# Patient Record
Sex: Male | Born: 1950 | Race: White | Hispanic: No | Marital: Married | Smoking: Former smoker
Health system: Southern US, Community
[De-identification: ages and names within clinical notes are randomized; demographics above are authoritative.]

## PROBLEM LIST (undated history)

## (undated) DIAGNOSIS — E291 Testicular hypofunction: Secondary | ICD-10-CM

## (undated) DIAGNOSIS — K219 Gastro-esophageal reflux disease without esophagitis: Secondary | ICD-10-CM

## (undated) DIAGNOSIS — R7303 Prediabetes: Secondary | ICD-10-CM

## (undated) DIAGNOSIS — E559 Vitamin D deficiency, unspecified: Secondary | ICD-10-CM

## (undated) DIAGNOSIS — N400 Enlarged prostate without lower urinary tract symptoms: Secondary | ICD-10-CM

## (undated) DIAGNOSIS — I10 Essential (primary) hypertension: Secondary | ICD-10-CM

## (undated) DIAGNOSIS — J309 Allergic rhinitis, unspecified: Secondary | ICD-10-CM

## (undated) DIAGNOSIS — N200 Calculus of kidney: Secondary | ICD-10-CM

## (undated) DIAGNOSIS — E785 Hyperlipidemia, unspecified: Secondary | ICD-10-CM

## (undated) HISTORY — DX: Hyperlipidemia, unspecified: E78.5

## (undated) HISTORY — DX: Benign prostatic hyperplasia without lower urinary tract symptoms: N40.0

## (undated) HISTORY — DX: Morbid (severe) obesity due to excess calories: E66.01

## (undated) HISTORY — DX: Prediabetes: R73.03

## (undated) HISTORY — DX: Gastro-esophageal reflux disease without esophagitis: K21.9

## (undated) HISTORY — DX: Calculus of kidney: N20.0

## (undated) HISTORY — DX: Vitamin D deficiency, unspecified: E55.9

## (undated) HISTORY — DX: Testicular hypofunction: E29.1

## (undated) HISTORY — DX: Allergic rhinitis, unspecified: J30.9

## (undated) HISTORY — DX: Essential (primary) hypertension: I10

---
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
## 1975-09-28 HISTORY — PX: KNEE SURGERY: SHX244

## 1990-09-27 HISTORY — PX: FLEXIBLE SIGMOIDOSCOPY: SHX1649

## 2005-12-04 ENCOUNTER — Emergency Department (HOSPITAL_COMMUNITY): Admission: EM | Admit: 2005-12-04 | Discharge: 2005-12-04 | Payer: Self-pay | Admitting: Emergency Medicine

## 2008-02-05 ENCOUNTER — Ambulatory Visit: Payer: Self-pay | Admitting: Internal Medicine

## 2008-02-16 ENCOUNTER — Telehealth: Payer: Self-pay | Admitting: Internal Medicine

## 2008-02-16 ENCOUNTER — Encounter: Payer: Self-pay | Admitting: Internal Medicine

## 2008-02-16 ENCOUNTER — Ambulatory Visit: Payer: Self-pay | Admitting: Internal Medicine

## 2008-02-20 ENCOUNTER — Encounter: Payer: Self-pay | Admitting: Internal Medicine

## 2008-12-12 ENCOUNTER — Ambulatory Visit (HOSPITAL_COMMUNITY): Admission: RE | Admit: 2008-12-12 | Discharge: 2008-12-12 | Payer: Self-pay | Admitting: Internal Medicine

## 2009-05-22 IMAGING — CR DG LUMBAR SPINE COMPLETE 4+V
5 series · 5 of 5 positions shown · non-contrast
Comparison: None

CLINICAL DATA: Lower back pain worse on the left side with
radiation into the left leg.

LUMBAR SPINE - COMPLETE 4+ VIEW

[view not recorded (1 of 5)]
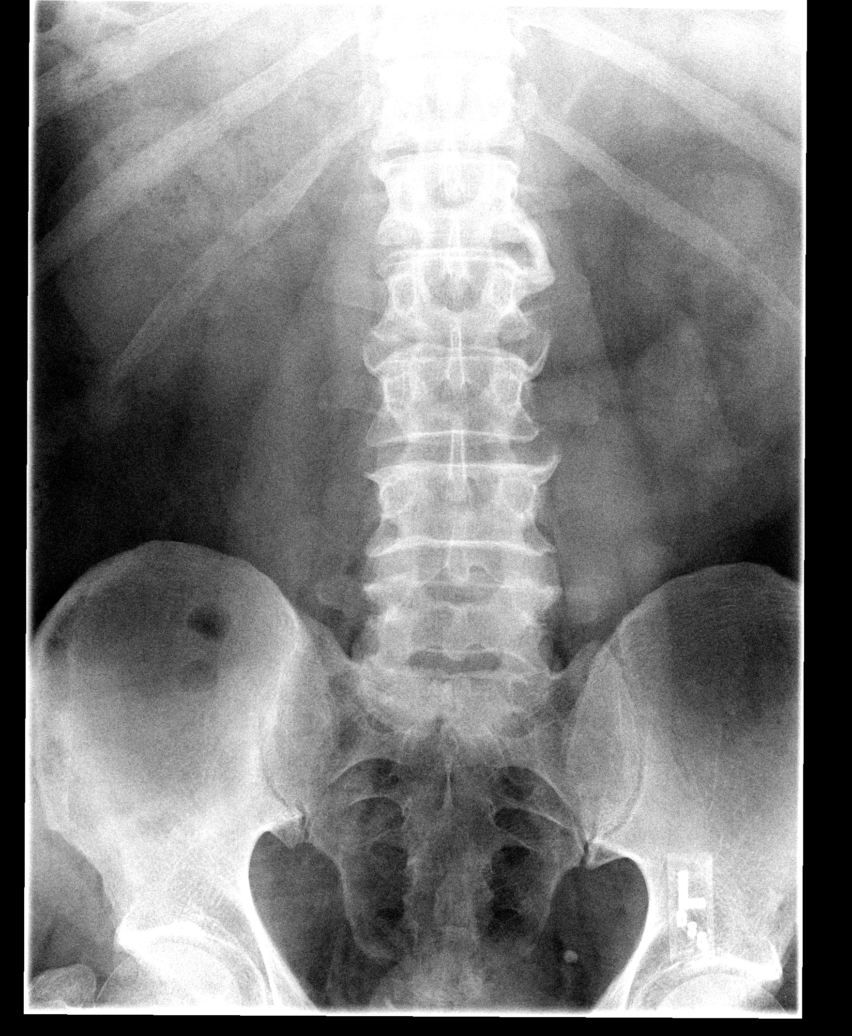

[view not recorded (2 of 5)]
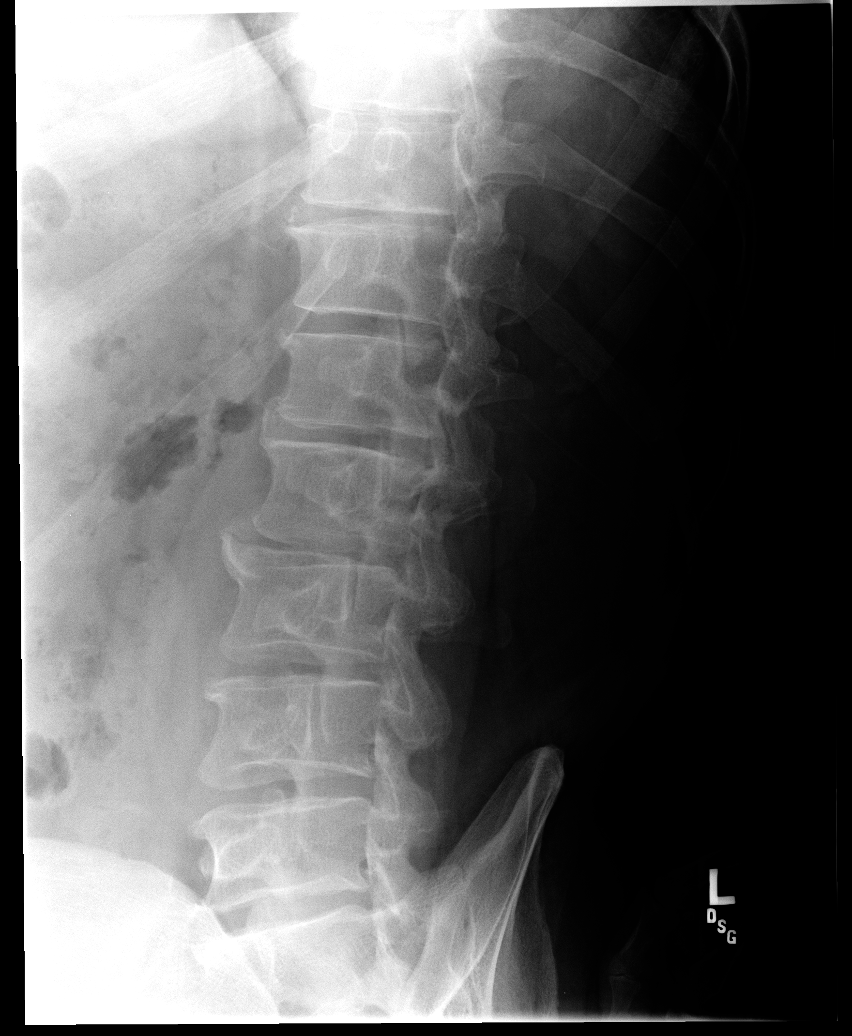

[view not recorded (3 of 5)]
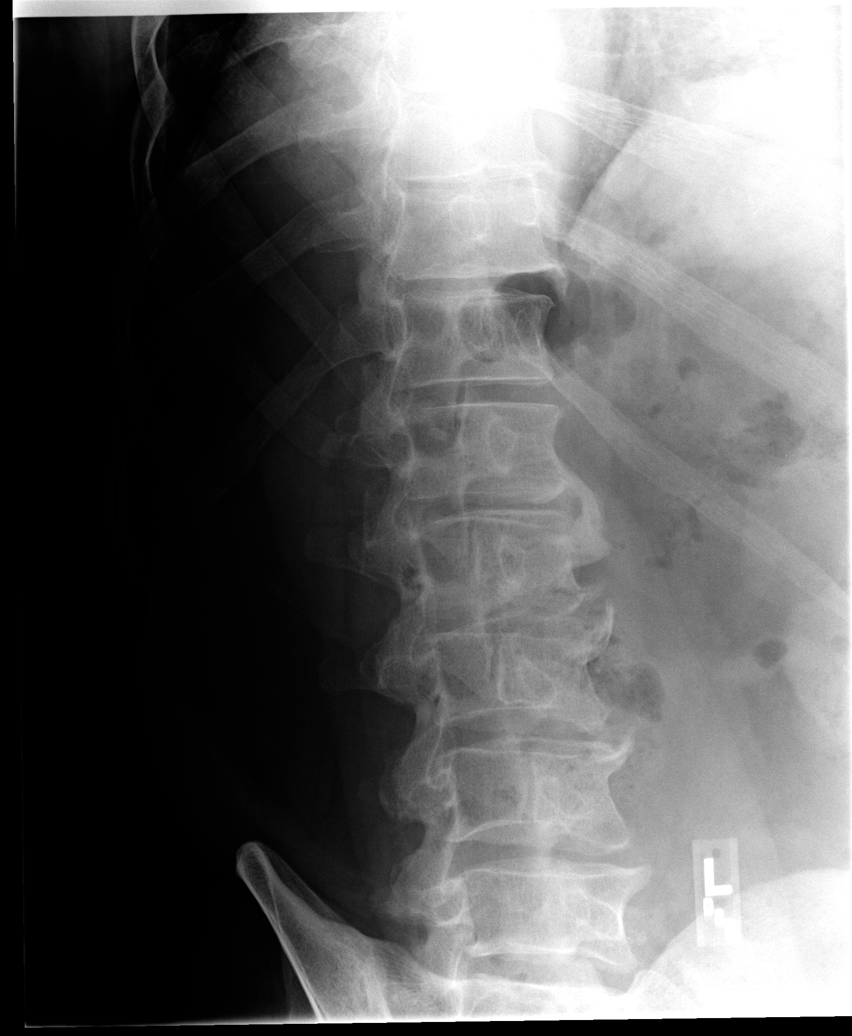

[view not recorded (4 of 5)]
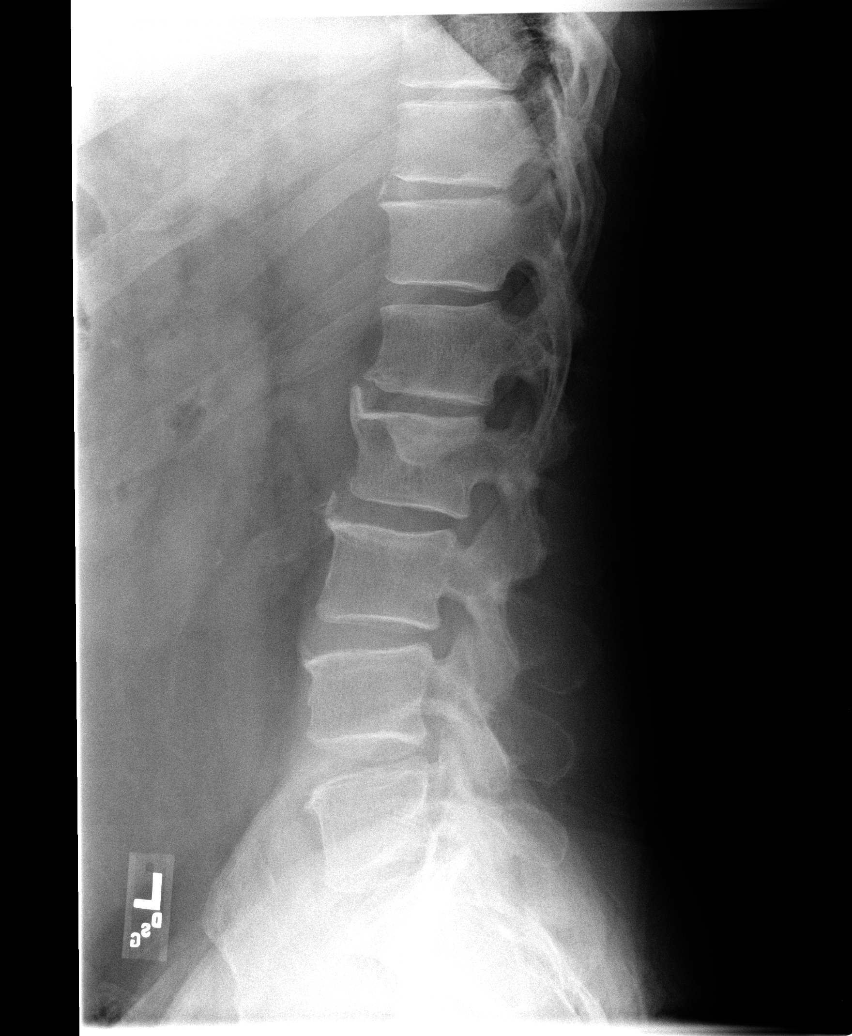

[view not recorded (5 of 5)]
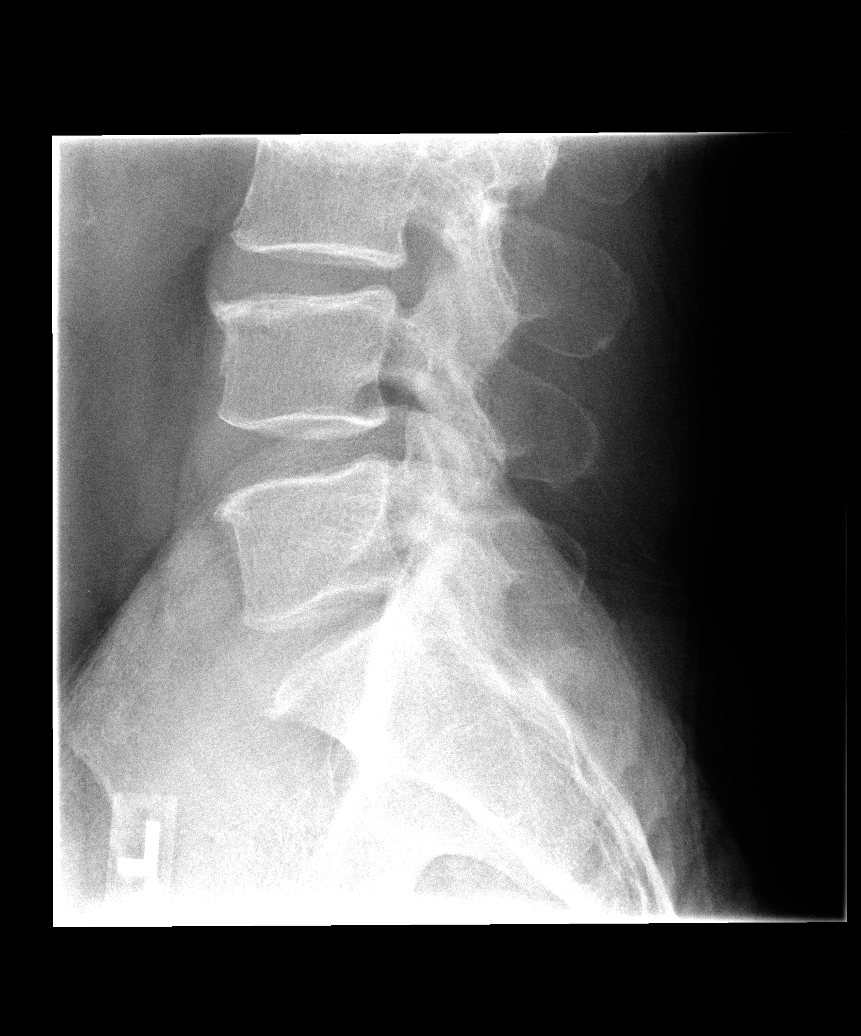

[5 of 5 positions shown; findings below may reference images not displayed]

FINDINGS: Bone density is within normal limits for age.  There are
five lumbar type vertebral bodies.  Mild anterior vertebral body
height loss is identified at the T11, L1 and L4 levels.  Associated
lateral and anterior osteophytosis is identified at the T11-12, L1-
2, L2-3 and L3-4  levels.  Anterior lipping of the L5 vertebral
body is also noted.

Lateral views demonstrate  mild retrolisthesis of L2 on L3
measuring 3 mm.  Oblique views demonstrate no evidence for
spondylosis or spondylolisthesis.  The sacral white lines appear
maintained.
IMPRESSION: Degenerative bony changes as described above.

## 2013-04-11 ENCOUNTER — Encounter: Payer: Self-pay | Admitting: Internal Medicine

## 2013-08-08 ENCOUNTER — Other Ambulatory Visit: Payer: Self-pay | Admitting: Emergency Medicine

## 2013-09-25 ENCOUNTER — Encounter: Payer: Self-pay | Admitting: Internal Medicine

## 2013-09-28 ENCOUNTER — Ambulatory Visit: Payer: Self-pay | Admitting: Internal Medicine

## 2013-11-06 ENCOUNTER — Other Ambulatory Visit: Payer: Self-pay | Admitting: Emergency Medicine

## 2014-01-21 ENCOUNTER — Other Ambulatory Visit: Payer: Self-pay | Admitting: Internal Medicine

## 2014-03-26 ENCOUNTER — Other Ambulatory Visit: Payer: Self-pay | Admitting: Emergency Medicine

## 2014-04-01 ENCOUNTER — Encounter: Payer: Self-pay | Admitting: Internal Medicine

## 2014-04-17 ENCOUNTER — Encounter: Payer: Self-pay | Admitting: Internal Medicine

## 2014-05-20 ENCOUNTER — Other Ambulatory Visit: Payer: Self-pay | Admitting: Emergency Medicine

## 2014-05-20 MED ORDER — OMEPRAZOLE 40 MG PO CPDR: 40.0000 mg | DELAYED_RELEASE_CAPSULE | Freq: Every day | ORAL | Status: DC

## 2014-05-20 NOTE — Telephone Encounter (Signed)
Spoke with Alex Lewis and she will let patient to know to call and schedule follow up appt, will send 30 days supply to [facility] and [employer]

## 2014-05-29 ENCOUNTER — Ambulatory Visit (INDEPENDENT_AMBULATORY_CARE_PROVIDER_SITE_OTHER): Payer: BC Managed Care – PPO | Admitting: Internal Medicine

## 2014-05-29 ENCOUNTER — Encounter: Payer: Self-pay | Admitting: Internal Medicine

## 2014-05-29 ENCOUNTER — Encounter (INDEPENDENT_AMBULATORY_CARE_PROVIDER_SITE_OTHER): Payer: Self-pay

## 2014-05-29 VITALS — BP 118/86 | HR 76 | Temp 97.5°F | Resp 16 | Ht 70.0 in | Wt 246.4 lb

## 2014-05-29 DIAGNOSIS — I10 Essential (primary) hypertension: Secondary | ICD-10-CM

## 2014-05-29 DIAGNOSIS — E782 Mixed hyperlipidemia: Secondary | ICD-10-CM | POA: Insufficient documentation

## 2014-05-29 DIAGNOSIS — R7303 Prediabetes: Secondary | ICD-10-CM

## 2014-05-29 DIAGNOSIS — Z79899 Other long term (current) drug therapy: Secondary | ICD-10-CM | POA: Insufficient documentation

## 2014-05-29 DIAGNOSIS — R7309 Other abnormal glucose: Secondary | ICD-10-CM | POA: Insufficient documentation

## 2014-05-29 DIAGNOSIS — Z111 Encounter for screening for respiratory tuberculosis: Secondary | ICD-10-CM

## 2014-05-29 DIAGNOSIS — R740 Nonspecific elevation of levels of transaminase and lactic acid dehydrogenase [LDH]: Secondary | ICD-10-CM

## 2014-05-29 DIAGNOSIS — Z113 Encounter for screening for infections with a predominantly sexual mode of transmission: Secondary | ICD-10-CM

## 2014-05-29 DIAGNOSIS — Z125 Encounter for screening for malignant neoplasm of prostate: Secondary | ICD-10-CM

## 2014-05-29 DIAGNOSIS — R7402 Elevation of levels of lactic acid dehydrogenase (LDH): Secondary | ICD-10-CM

## 2014-05-29 DIAGNOSIS — E559 Vitamin D deficiency, unspecified: Secondary | ICD-10-CM

## 2014-05-29 DIAGNOSIS — R7401 Elevation of levels of liver transaminase levels: Secondary | ICD-10-CM

## 2014-05-29 DIAGNOSIS — Z1212 Encounter for screening for malignant neoplasm of rectum: Secondary | ICD-10-CM

## 2014-05-29 DIAGNOSIS — Z0000 Encounter for general adult medical examination without abnormal findings: Secondary | ICD-10-CM

## 2014-05-29 LAB — CBC WITH DIFFERENTIAL/PLATELET
BASOS PCT: 0 % (ref 0–1)
Basophils Absolute: 0 10*3/uL (ref 0.0–0.1)
Eosinophils Absolute: 0.1 10*3/uL (ref 0.0–0.7)
Eosinophils Relative: 2 % (ref 0–5)
HCT: 46.7 % (ref 39.0–52.0)
Hemoglobin: 16.4 g/dL (ref 13.0–17.0)
LYMPHS ABS: 1.5 10*3/uL (ref 0.7–4.0)
LYMPHS PCT: 28 % (ref 12–46)
MCH: 31.2 pg (ref 26.0–34.0)
MCHC: 35.1 g/dL (ref 30.0–36.0)
MCV: 89 fL (ref 78.0–100.0)
MONO ABS: 0.5 10*3/uL (ref 0.1–1.0)
Monocytes Relative: 9 % (ref 3–12)
Neutro Abs: 3.4 10*3/uL (ref 1.7–7.7)
Neutrophils Relative %: 61 % (ref 43–77)
PLATELETS: 227 10*3/uL (ref 150–400)
RBC: 5.25 MIL/uL (ref 4.22–5.81)
RDW: 13.2 % (ref 11.5–15.5)
WBC: 5.5 10*3/uL (ref 4.0–10.5)

## 2014-05-29 LAB — HEMOGLOBIN A1C
Hgb A1c MFr Bld: 6 % — ABNORMAL HIGH (ref <5.7)
MEAN PLASMA GLUCOSE: 126 mg/dL — AB (ref <117)

## 2014-05-29 NOTE — Patient Instructions (Signed)
Recommend the book "The END of DIETING" by Dr Baker Janus   and the book "The END of DIABETES " by Dr Excell Seltzer  At [email or web address] - get book & Audio CD's      Being diabetic has a  300% increased risk for heart attack, stroke, cancer, and alzheimer- type vascular dementia. It is very important that you work harder with diet by avoiding all foods that are white except chicken & fish. Avoid white rice (brown & wild rice is OK), white potatoes (sweetpotatoes in moderation is OK), White bread or wheat bread or anything made out of white flour like bagels, donuts, rolls, buns, biscuits, cakes, pastries, cookies, pizza crust, and pasta (made from white flour & egg whites) - vegetarian pasta or spinach or wheat pasta is OK. Multigrain breads like Arnold's or [employer], or multigrain sandwich thins or flatbreads.  Diet, exercise and weight loss can reverse and cure diabetes in the early stages.  Diet, exercise and weight loss is very important in the control and prevention of complications of diabetes which affects every system in your body, ie. Brain - dementia/stroke, eyes - glaucoma/blindness, heart - heart attack/heart failure, kidneys - dialysis, stomach - gastric paralysis, intestines - malabsorption, nerves - severe painful neuritis, circulation - gangrene & loss of a leg(s), and finally cancer and Alzheimers.    I recommend avoid fried & greasy foods,  sweets/candy, white rice (brown or wild rice or Quinoa is OK), white potatoes (sweet potatoes are OK) - anything made from white flour - bagels, doughnuts, rolls, buns, biscuits,white and wheat breads, pizza crust and traditional pasta made of white flour & egg white(vegetarian pasta or spinach or wheat pasta is OK).  Multi-grain bread is OK - like multi-grain flat bread or sandwich thins. Avoid alcohol in excess. Exercise is also important.    Eat all the vegetables you want - avoid meat, especially red meat and dairy - especially cheese.  Cheese  is the most concentrated form of trans-fats which is the worst thing to clog up our arteries. Veggie cheese is OK which can be found in the fresh produce section at [employer] or [employer] or [employer]  Preventive Care for Adults A healthy lifestyle and preventive care can promote health and wellness. Preventive health guidelines for men include the following key practices:  A routine yearly physical is a good way to check with your health care Emile Ringgenberg about your health and preventative screening. It is a chance to share any concerns and updates on your health and to receive a thorough exam.  Visit your dentist for a routine exam and preventative care every 6 months. Brush your teeth twice a day and floss once a day. Good oral hygiene prevents tooth decay and gum disease.  The frequency of eye exams is based on your age, health, family medical history, use of contact lenses, and other factors. Follow your health care Pelagia Iacobucci's recommendations for frequency of eye exams.  Eat a healthy diet. Foods such as vegetables, fruits, whole grains, low-fat dairy products, and lean protein foods contain the nutrients you need without too many calories. Decrease your intake of foods high in solid fats, added sugars, and salt. Eat the right amount of calories for you.Get information about a proper diet from your health care Starsha Morning, if necessary.  Regular physical exercise is one of the most important things you can do for your health. Most adults should get at least 150 minutes of moderate-intensity exercise (any activity that  increases your heart rate and causes you to sweat) each week. In addition, most adults need muscle-strengthening exercises on 2 or more days a week.  Maintain a healthy weight. The body mass index (BMI) is a screening tool to identify possible weight problems. It provides an estimate of body fat based on height and weight. Your health care Evgenia Merriman can find your BMI and can help you  achieve or maintain a healthy weight.For adults 20 years and older:  A BMI below 18.5 is considered underweight.  A BMI of 18.5 to 24.9 is normal.  A BMI of 25 to 29.9 is considered overweight.  A BMI of 30 and above is considered obese.  Maintain normal blood lipids and cholesterol levels by exercising and minimizing your intake of saturated fat. Eat a balanced diet with plenty of fruit and vegetables. Blood tests for lipids and cholesterol should begin at age 20 and be repeated every 5 years. If your lipid or cholesterol levels are high, you are over 50, or you are at high risk for heart disease, you may need your cholesterol levels checked more frequently.Ongoing high lipid and cholesterol levels should be treated with medicines if diet and exercise are not working.  If you smoke, find out from your health care Nosson Wender how to quit. If you do not use tobacco, do not start.  Lung cancer screening is recommended for adults aged 72-80 years who are at high risk for developing lung cancer because of a history of smoking. A yearly low-dose CT scan of the lungs is recommended for people who have at least a 30-pack-year history of smoking and are a current smoker or have quit within the past 15 years. A pack year of smoking is smoking an average of 1 pack of cigarettes a day for 1 year (for example: 1 pack a day for 30 years or 2 packs a day for 15 years). Yearly screening should continue until the smoker has stopped smoking for at least 15 years. Yearly screening should be stopped for people who develop a health problem that would prevent them from having lung cancer treatment.  If you choose to drink alcohol, do not have more than 2 drinks per day. One drink is considered to be 12 ounces (355 mL) of beer, 5 ounces (148 mL) of wine, or 1.5 ounces (44 mL) of liquor.  Avoid use of street drugs. Do not share needles with anyone. Ask for help if you need support or instructions about stopping the use of  drugs.  High blood pressure causes heart disease and increases the risk of stroke. Your blood pressure should be checked at least every 1-2 years. Ongoing high blood pressure should be treated with medicines, if weight loss and exercise are not effective.  If you are 28-64 years old, ask your health care Candida Vetter if you should take aspirin to prevent heart disease.  Diabetes screening involves taking a blood sample to check your fasting blood sugar level. This should be done once every 3 years, after age 13, if you are within normal weight and without risk factors for diabetes. Testing should be considered at a younger age or be carried out more frequently if you are overweight and have at least 1 risk factor for diabetes.  Colorectal cancer can be detected and often prevented. Most routine colorectal cancer screening begins at the age of 78 and continues through age 56. However, your health care Shaterra Sanzone may recommend screening at an earlier age if you have risk  factors for colon cancer. On a yearly basis, your health care Jai Bear may provide home test kits to check for hidden blood in the stool. Use of a small camera at the end of a tube to directly examine the colon (sigmoidoscopy or colonoscopy) can detect the earliest forms of colorectal cancer. Talk to your health care Ariann Khaimov about this at age 48, when routine screening begins. Direct exam of the colon should be repeated every 5-10 years through age 60, unless early forms of precancerous polyps or small growths are found.  People who are at an increased risk for hepatitis B should be screened for this virus. You are considered at high risk for hepatitis B if:  You were born in a country where hepatitis B occurs often. Talk with your health care Colen Eltzroth about which countries are considered high risk.  Your parents were born in a high-risk country and you have not received a shot to protect against hepatitis B (hepatitis B vaccine).  You have  HIV or AIDS.  You use needles to inject street drugs.  You live with, or have sex with, someone who has hepatitis B.  You are a man who has sex with other men (MSM).  You get hemodialysis treatment.  You take certain medicines for conditions such as cancer, organ transplantation, and autoimmune conditions.  Hepatitis C blood testing is recommended for all people born from [birth year] through 1965 and any individual with known risks for hepatitis C.  Practice safe sex. Use condoms and avoid high-risk sexual practices to reduce the spread of sexually transmitted infections (STIs). STIs include gonorrhea, chlamydia, syphilis, trichomonas, herpes, HPV, and human immunodeficiency virus (HIV). Herpes, HIV, and HPV are viral illnesses that have no cure. They can result in disability, cancer, and death.  If you are at risk of being infected with HIV, it is recommended that you take a prescription medicine daily to prevent HIV infection. This is called preexposure prophylaxis (PrEP). You are considered at risk if:  You are a man who has sex with other men (MSM) and have other risk factors.  You are a heterosexual man, are sexually active, and are at increased risk for HIV infection.  You take drugs by injection.  You are sexually active with a partner who has HIV.  Talk with your health care Dalaney Needle about whether you are at high risk of being infected with HIV. If you choose to begin PrEP, you should first be tested for HIV. You should then be tested every 3 months for as long as you are taking PrEP.  A one-time screening for abdominal aortic aneurysm (AAA) and surgical repair of large AAAs by ultrasound are recommended for men ages 51 to 11 years who are current or former smokers.  Healthy men should no longer receive prostate-specific antigen (PSA) blood tests as part of routine cancer screening. Talk with your health care Gladyce Mcray about prostate cancer screening.  Testicular cancer screening is  not recommended for adult males who have no symptoms. Screening includes self-exam, a health care Keyontae Huckeby exam, and other screening tests. Consult with your health care Almarie Kurdziel about any symptoms you have or any concerns you have about testicular cancer.  Use sunscreen. Apply sunscreen liberally and repeatedly throughout the day. You should seek shade when your shadow is shorter than you. Protect yourself by wearing long sleeves, pants, a wide-brimmed hat, and sunglasses year round, whenever you are outdoors.  Once a month, do a whole-body skin exam, using a mirror to look  at the skin on your back. Tell your health care Brantlee Penn about new moles, moles that have irregular borders, moles that are larger than a pencil eraser, or moles that have changed in shape or color.  Stay current with required vaccines (immunizations).  Influenza vaccine. All adults should be immunized every year.  Tetanus, diphtheria, and acellular pertussis (Td, Tdap) vaccine. An adult who has not previously received Tdap or who does not know his vaccine status should receive 1 dose of Tdap. This initial dose should be followed by tetanus and diphtheria toxoids (Td) booster doses every 10 years. Adults with an unknown or incomplete history of completing a 3-dose immunization series with Td-containing vaccines should begin or complete a primary immunization series including a Tdap dose. Adults should receive a Td booster every 10 years.  Varicella vaccine. An adult without evidence of immunity to varicella should receive 2 doses or a second dose if he has previously received 1 dose.  Human papillomavirus (HPV) vaccine. Males aged 29-21 years who have not received the vaccine previously should receive the 3-dose series. Males aged 22-26 years may be immunized. Immunization is recommended through the age of 26 years for any male who has sex with males and did not get any or all doses earlier. Immunization is recommended for any  person with an immunocompromised condition through the age of 29 years if he did not get any or all doses earlier. During the 3-dose series, the second dose should be obtained 4-8 weeks after the first dose. The third dose should be obtained 24 weeks after the first dose and 16 weeks after the second dose.  Zoster vaccine. One dose is recommended for adults aged 38 years or older unless certain conditions are present.  Measles, mumps, and rubella (MMR) vaccine. Adults born before [birth year] generally are considered immune to measles and mumps. Adults born in [birth year] or later should have 1 or more doses of MMR vaccine unless there is a contraindication to the vaccine or there is laboratory evidence of immunity to each of the three diseases. A routine second dose of MMR vaccine should be obtained at least 28 days after the first dose for students attending postsecondary schools, health care workers, or international travelers. People who received inactivated measles vaccine or an unknown type of measles vaccine during 1963-1967 should receive 2 doses of MMR vaccine. People who received inactivated mumps vaccine or an unknown type of mumps vaccine before 1979 and are at high risk for mumps infection should consider immunization with 2 doses of MMR vaccine. Unvaccinated health care workers born before [birth year] who lack laboratory evidence of measles, mumps, or rubella immunity or laboratory confirmation of disease should consider measles and mumps immunization with 2 doses of MMR vaccine or rubella immunization with 1 dose of MMR vaccine.  Pneumococcal 13-valent conjugate (PCV13) vaccine. When indicated, a person who is uncertain of his immunization history and has no record of immunization should receive the PCV13 vaccine. An adult aged 68 years or older who has certain medical conditions and has not been previously immunized should receive 1 dose of PCV13 vaccine. This PCV13 should be followed with a dose of pneumococcal  polysaccharide (PPSV23) vaccine. The PPSV23 vaccine dose should be obtained at least 8 weeks after the dose of PCV13 vaccine. An adult aged 95 years or older who has certain medical conditions and previously received 1 or more doses of PPSV23 vaccine should receive 1 dose of PCV13. The PCV13 vaccine dose should be obtained 1  or more years after the last PPSV23 vaccine dose.  Pneumococcal polysaccharide (PPSV23) vaccine. When PCV13 is also indicated, PCV13 should be obtained first. All adults aged 65 years and older should be immunized. An adult younger than age 65 years who has certain medical conditions should be immunized. Any person who resides in a nursing home or long-term care facility should be immunized. An adult smoker should be immunized. People with an immunocompromised condition and certain other conditions should receive both PCV13 and PPSV23 vaccines. People with human immunodeficiency virus (HIV) infection should be immunized as soon as possible after diagnosis. Immunization during chemotherapy or radiation therapy should be avoided. Routine use of PPSV23 vaccine is not recommended for American Indians, [place] Natives, or people younger than 65 years unless there are medical conditions that require PPSV23 vaccine. When indicated, people who have unknown immunization and have no record of immunization should receive PPSV23 vaccine. One-time revaccination 5 years after the first dose of PPSV23 is recommended for people aged 19-64 years who have chronic kidney failure, nephrotic syndrome, asplenia, or immunocompromised conditions. People who received 1-2 doses of PPSV23 before age 65 years should receive another dose of PPSV23 vaccine at age 65 years or later if at least 5 years have passed since the previous dose. Doses of PPSV23 are not needed for people immunized with PPSV23 at or after age 65 years.  Meningococcal vaccine. Adults with asplenia or persistent complement component deficiencies  should receive 2 doses of quadrivalent meningococcal conjugate (MenACWY-D) vaccine. The doses should be obtained at least 2 months apart. Microbiologists working with certain meningococcal bacteria, military recruits, people at risk during an outbreak, and people who travel to or live in countries with a high rate of meningitis should be immunized. A first-year college student up through age 21 years who is living in a residence hall should receive a dose if he did not receive a dose on or after his 16th birthday. Adults who have certain high-risk conditions should receive one or more doses of vaccine.  Hepatitis A vaccine. Adults who wish to be protected from this disease, have certain high-risk conditions, work with hepatitis A-infected animals, work in hepatitis A research labs, or travel to or work in countries with a high rate of hepatitis A should be immunized. Adults who were previously unvaccinated and who anticipate close contact with an international adoptee during the first 60 days after arrival in the [place] from a country with a high rate of hepatitis A should be immunized.  Hepatitis B vaccine. Adults should be immunized if they wish to be protected from this disease, have certain high-risk conditions, may be exposed to blood or other infectious body fluids, are household contacts or sex partners of hepatitis B positive people, are clients or workers in certain care facilities, or travel to or work in countries with a high rate of hepatitis B.  Haemophilus influenzae type b (Hib) vaccine. A previously unvaccinated person with asplenia or sickle cell disease or having a scheduled splenectomy should receive 1 dose of Hib vaccine. Regardless of previous immunization, a recipient of a hematopoietic stem cell transplant should receive a 3-dose series 6-12 months after his successful transplant. Hib vaccine is not recommended for adults with HIV infection. Preventive Service / Frequency Ages  40 to 64  Blood pressure check.** / Every 1 to 2 years.  Lipid and cholesterol check.** / Every 5 years beginning at age 20.  Lung cancer screening. / Every year if you are aged 55-80   years and have a 30-pack-year history of smoking and currently smoke or have quit within the past 15 years. Yearly screening is stopped once you have quit smoking for at least 15 years or develop a health problem that would prevent you from having lung cancer treatment.  Fecal occult blood test (FOBT) of stool. / Every year beginning at age 50 and continuing until age 75. You may not have to do this test if you get a colonoscopy every 10 years.  Flexible sigmoidoscopy** or colonoscopy.** / Every 5 years for a flexible sigmoidoscopy or every 10 years for a colonoscopy beginning at age 50 and continuing until age 75.  Hepatitis C blood test.** / For all people born from 1945 through 1965 and any individual with known risks for hepatitis C.  Skin self-exam. / Monthly.  Influenza vaccine. / Every year.  Tetanus, diphtheria, and acellular pertussis (Tdap/Td) vaccine.** / Consult your health care Corben Auzenne. 1 dose of Td every 10 years.  Varicella vaccine.** / Consult your health care Zaida Reiland.  Zoster vaccine.** / 1 dose for adults aged 60 years or older.  Measles, mumps, rubella (MMR) vaccine.** / You need at least 1 dose of MMR if you were born in 1957 or later. You may also need a second dose.  Pneumococcal 13-valent conjugate (PCV13) vaccine.** / Consult your health care Edyn Popoca.  Pneumococcal polysaccharide (PPSV23) vaccine.** / 1 to 2 doses if you smoke cigarettes or if you have certain conditions.  Meningococcal vaccine.** / Consult your health care Aniyiah Zell.  Hepatitis A vaccine.** / Consult your health care Kennen Stammer.  Hepatitis B vaccine.** / Consult your health care Gorje Iyer.  Haemophilus influenzae type b (Hib) vaccine.** / Consult your health care Rosella Crandell.  

## 2014-05-29 NOTE — Progress Notes (Signed)
Patient ID: Alex Lewis, male   DOB: Apr 10, 1951, 63 y.o.   MRN: [id number]   Annual Screening Comprehensive Examination  This very nice 63 y.o.MWM presents for complete physical.  Patient has been followed for HTN, Prediabetes, Hyperlipidemia, and Vitamin D Deficiency. Patient has been lost to f/u since last Sept 2014    HTN predates since 2001. Patient's BP has been controlled at home.Today's BP: 118/86 mmHg. Patient denies any cardiac symptoms as chest pain, palpitations, shortness of breath, dizziness or ankle swelling.   Patient's hyperlipidemia is controlled with diet and medications. Patient denies myalgias or other medication SE's. Last lipids were Chol 137, TG 130, HDL 36 and LDL 75 in Sept 4098.     Patient has Morbid Obesity (BMI 36) and consequent PreDiabetes. Patient denies reactive hypoglycemic symptoms, visual blurring, diabetic polys, or paresthesias. Last A1c was 6.0% in Sept 2014.    Finally, patient has history of Vitamin D Deficiency of 16 in 2008 and last vitamin D was No results found for requested labs within last 365 days..   Medication Sig  . aspirin 81 MG tablet Take 81 mg by mouth daily.  Marland Kitchen atenolol (TENORMIN) 50 MG tablet TAKE 1 TABLET DAILY FOR    BLOOD PRESSURE  . Cholecalciferol (VITAMIN D-3) 5000 UNITS TABS Take 10,000 Units by mouth daily.  Marland Kitchen loratadine (CLARITIN) 10 MG tablet Take 10 mg by mouth daily.  Marland Kitchen omeprazole (PRILOSEC) 40 MG capsule Take 1 capsule (40 mg total) by mouth daily.  . pravastatin (PRAVACHOL) 40 MG tablet TAKE 1 TABLET DAILY FOR    CHOLESTEROL   Allergies  Allergen Reactions  . Penicillins     REACTION: unknown   Past Medical History  Diagnosis Date  . Hypertension   . Hyperlipidemia   . Pre-diabetes   . Morbid obesity   . Hypogonadism, male   . Vitamin D deficiency   . GERD (gastroesophageal reflux disease)   . Allergic rhinitis   . BPH (benign prostatic hyperplasia)   . Kidney stones    Past Surgical History  Procedure  Laterality Date  . Knee surgery Right 1977  . Flexible sigmoidoscopy  1992  . Tonsillectomy and adenoidectomy  1960   Family History  Problem Relation Age of Onset  . Cancer Father     lung  . Diabetes Father   . Diabetes Brother    History   Social History  . Marital Status: Married    Spouse Name: N/A    Number of Children: N/A  . Years of Education: N/A   Occupational History  . 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 Smoking status: Former Smoker    Quit date: 09/28/1975  . Smokeless tobacco: Not on file  . Alcohol Use: Yes     Comment: occasional  . Drug Use: No  . Sexual Activity: Not on file      ROS Constitutional: Denies fever, chills, weight loss/gain, headaches, insomnia, fatigue, night sweats or change in appetite. Eyes: Denies redness, blurred vision, diplopia, discharge, itchy or watery eyes.  ENT: Denies discharge, congestion, post nasal drip, epistaxis, sore throat, earache, hearing loss, dental pain, Tinnitus, Vertigo, Sinus pain or snoring.  Cardio: Denies chest pain, palpitations, irregular heartbeat, syncope, dyspnea, diaphoresis, orthopnea, PND, claudication or edema Respiratory: denies cough, dyspnea, DOE, pleurisy, hoarseness, laryngitis or wheezing.  Gastrointestinal: Denies dysphagia, heartburn, reflux, water brash, pain, cramps, nausea, vomiting, bloating, diarrhea, constipation, hematemesis, melena, hematochezia, jaundice or hemorrhoids Genitourinary: Denies  dysuria, frequency, urgency, nocturia, hesitancy, discharge, hematuria or flank pain Musculoskeletal: Denies arthralgia, myalgia, stiffness, Jt. Swelling, pain, limp or strain/sprain. Denies Falls. Skin: Denies puritis, rash, hives, warts, acne, eczema or change in skin lesion Neuro: No weakness, tremor, incoordination, spasms, paresthesia or pain Psychiatric: Denies confusion, memory loss or sensory loss. Denies Depression. Endocrine: Denies change in  weight, skin, hair change, nocturia, and paresthesia, diabetic polys, visual blurring or hyper / hypo glycemic episodes.  Heme/Lymph: No excessive bleeding, bruising or enlarged lymph nodes.  Physical Exam  BP 118/86  Pulse 76  Temp(Src) 97.5 F (36.4 C) (Temporal)  Resp 16  Ht  (1.778 m)  Wt 246 lb 6.4 oz (111.766 kg)  BMI 35.35 kg/m2  General Appearance: Well nourished, in no apparent distress. Eyes: PERRLA, EOMs, conjunctiva no swelling or erythema, normal fundi and vessels. Sinuses: No frontal/maxillary tenderness ENT/Mouth: EACs patent / TMs  nl. Nares clear without erythema, swelling, mucoid exudates. Oral hygiene is good. No erythema, swelling, or exudate. Tongue normal, non-obstructing. Tonsils not swollen or erythematous. Hearing normal.  Neck: Supple, thyroid normal. No bruits, nodes or JVD. Respiratory: Respiratory effort normal.  BS equal and clear bilateral without rales, rhonci, wheezing or stridor. Cardio: Heart sounds are normal with regular rate and rhythm and no murmurs, rubs or gallops. Peripheral pulses are normal and equal bilaterally without edema. No aortic or femoral bruits. Chest: symmetric with normal excursions and percussion.  Abdomen: Flat, soft, with bowl sounds. Nontender, no guarding, rebound, hernias, masses, or organomegaly.  Lymphatics: Non tender without lymphadenopathy.  Genitourinary: No hernias.Testes nl. DRE - prostate nl for age - smooth & firm w/o nodules. Musculoskeletal: Full ROM all peripheral extremities, joint stability, 5/5 strength, and normal gait. Skin: Warm and dry without rashes, lesions, cyanosis, clubbing or  ecchymosis.  Neuro: Cranial nerves intact, reflexes equal bilaterally. Normal muscle tone, no cerebellar symptoms. Sensation intact.  Pysch: Awake and oriented X 3 with normal affect, insight and judgment appropriate.  Assessment and Plan  1. Annual Screening Examination 2. Hypertension  3. Hyperlipidemia 4. Pre  Diabetes 5. Vitamin D Deficiency 6. Morbid Obesity   Continue prudent diet as discussed, weight control, BP monitoring, regular exercise, and medications as discussed.  Discussed med effects and SE's. Routine screening labs and tests as requested with regular follow-up as recommended.  Long discussion and counseling on dieting and weight loss

## 2014-05-30 LAB — MAGNESIUM: MAGNESIUM: 1.9 mg/dL (ref 1.5–2.5)

## 2014-05-30 LAB — BASIC METABOLIC PANEL WITH GFR
BUN: 11 mg/dL (ref 6–23)
CALCIUM: 9.5 mg/dL (ref 8.4–10.5)
CHLORIDE: 103 meq/L (ref 96–112)
CO2: 24 meq/L (ref 19–32)
Creat: 1.22 mg/dL (ref 0.50–1.35)
GFR, Est African American: 72 mL/min
GFR, Est Non African American: 63 mL/min
Glucose, Bld: 104 mg/dL — ABNORMAL HIGH (ref 70–99)
Potassium: 4.3 mEq/L (ref 3.5–5.3)
Sodium: 139 mEq/L (ref 135–145)

## 2014-05-30 LAB — URINALYSIS, MICROSCOPIC ONLY
BACTERIA UA: NONE SEEN
CASTS: NONE SEEN
CRYSTALS: NONE SEEN

## 2014-05-30 LAB — VITAMIN B12: VITAMIN B 12: 370 pg/mL (ref 211–911)

## 2014-05-30 LAB — HEPATIC FUNCTION PANEL
ALK PHOS: 55 U/L (ref 39–117)
ALT: 22 U/L (ref 0–53)
AST: 22 U/L (ref 0–37)
Albumin: 4.6 g/dL (ref 3.5–5.2)
Bilirubin, Direct: 0.3 mg/dL (ref 0.0–0.3)
Indirect Bilirubin: 1.1 mg/dL (ref 0.2–1.2)
TOTAL PROTEIN: 7 g/dL (ref 6.0–8.3)
Total Bilirubin: 1.4 mg/dL — ABNORMAL HIGH (ref 0.2–1.2)

## 2014-05-30 LAB — MICROALBUMIN / CREATININE URINE RATIO
CREATININE, URINE: 334.9 mg/dL
MICROALB UR: 1.19 mg/dL (ref 0.00–1.89)
MICROALB/CREAT RATIO: 3.6 mg/g (ref 0.0–30.0)

## 2014-05-30 LAB — RPR

## 2014-05-30 LAB — TESTOSTERONE: Testosterone: 527 ng/dL (ref 300–890)

## 2014-05-30 LAB — TSH: TSH: 0.942 u[IU]/mL (ref 0.350–4.500)

## 2014-05-30 LAB — LIPID PANEL
Cholesterol: 110 mg/dL (ref 0–200)
HDL: 36 mg/dL — AB (ref >39)
LDL CALC: 54 mg/dL (ref 0–99)
Total CHOL/HDL Ratio: 3.1 Ratio
Triglycerides: 100 mg/dL (ref <150)
VLDL: 20 mg/dL (ref 0–40)

## 2014-05-30 LAB — PSA: PSA: 0.66 ng/mL (ref <=4.00)

## 2014-05-30 LAB — INSULIN, FASTING: INSULIN FASTING, SERUM: 8.2 u[IU]/mL (ref 2.0–19.6)

## 2014-05-30 LAB — VITAMIN D 25 HYDROXY (VIT D DEFICIENCY, FRACTURES): VIT D 25 HYDROXY: 105 ng/mL — AB (ref 30–89)

## 2014-05-30 LAB — HIV ANTIBODY (ROUTINE TESTING W REFLEX): HIV 1&2 Ab, 4th Generation: NONREACTIVE

## 2014-05-30 LAB — URIC ACID: Uric Acid, Serum: 7.9 mg/dL — ABNORMAL HIGH (ref 4.0–7.8)

## 2014-05-31 LAB — TB SKIN TEST
INDURATION: 0 mm
TB SKIN TEST: NEGATIVE

## 2014-06-01 NOTE — Addendum Note (Signed)
Addended by: [name] on: 06/01/2014 05:30 PM   Modules accepted: Level of Service

## 2014-06-15 ENCOUNTER — Other Ambulatory Visit: Payer: Self-pay | Admitting: Emergency Medicine

## 2014-07-10 ENCOUNTER — Other Ambulatory Visit: Payer: Self-pay | Admitting: Physician Assistant

## 2014-09-06 ENCOUNTER — Ambulatory Visit (INDEPENDENT_AMBULATORY_CARE_PROVIDER_SITE_OTHER): Payer: BC Managed Care – PPO | Admitting: Physician Assistant

## 2014-09-06 ENCOUNTER — Encounter: Payer: Self-pay | Admitting: Physician Assistant

## 2014-09-06 VITALS — BP 138/80 | HR 68 | Temp 97.7°F | Resp 16 | Ht 70.0 in | Wt 258.0 lb

## 2014-09-06 DIAGNOSIS — Z23 Encounter for immunization: Secondary | ICD-10-CM

## 2014-09-06 DIAGNOSIS — E559 Vitamin D deficiency, unspecified: Secondary | ICD-10-CM

## 2014-09-06 DIAGNOSIS — I10 Essential (primary) hypertension: Secondary | ICD-10-CM

## 2014-09-06 DIAGNOSIS — Z79899 Other long term (current) drug therapy: Secondary | ICD-10-CM

## 2014-09-06 DIAGNOSIS — R7303 Prediabetes: Secondary | ICD-10-CM

## 2014-09-06 DIAGNOSIS — E782 Mixed hyperlipidemia: Secondary | ICD-10-CM

## 2014-09-06 DIAGNOSIS — R7309 Other abnormal glucose: Secondary | ICD-10-CM

## 2014-09-06 DIAGNOSIS — F411 Generalized anxiety disorder: Secondary | ICD-10-CM

## 2014-09-06 LAB — HEPATIC FUNCTION PANEL
ALBUMIN: 4.2 g/dL (ref 3.5–5.2)
ALK PHOS: 59 U/L (ref 39–117)
ALT: 15 U/L (ref 0–53)
AST: 15 U/L (ref 0–37)
Bilirubin, Direct: 0.2 mg/dL (ref 0.0–0.3)
Indirect Bilirubin: 0.7 mg/dL (ref 0.2–1.2)
Total Bilirubin: 0.9 mg/dL (ref 0.2–1.2)
Total Protein: 6.4 g/dL (ref 6.0–8.3)

## 2014-09-06 LAB — CBC WITH DIFFERENTIAL/PLATELET
BASOS PCT: 0 % (ref 0–1)
Basophils Absolute: 0 10*3/uL (ref 0.0–0.1)
Eosinophils Absolute: 0.1 10*3/uL (ref 0.0–0.7)
Eosinophils Relative: 2 % (ref 0–5)
HEMATOCRIT: 45.5 % (ref 39.0–52.0)
HEMOGLOBIN: 15.9 g/dL (ref 13.0–17.0)
LYMPHS ABS: 1.9 10*3/uL (ref 0.7–4.0)
LYMPHS PCT: 28 % (ref 12–46)
MCH: 31.1 pg (ref 26.0–34.0)
MCHC: 34.9 g/dL (ref 30.0–36.0)
MCV: 88.9 fL (ref 78.0–100.0)
MONO ABS: 0.5 10*3/uL (ref 0.1–1.0)
MPV: 9.8 fL (ref 9.4–12.4)
Monocytes Relative: 8 % (ref 3–12)
NEUTROS PCT: 62 % (ref 43–77)
Neutro Abs: 4.2 10*3/uL (ref 1.7–7.7)
Platelets: 190 10*3/uL (ref 150–400)
RBC: 5.12 MIL/uL (ref 4.22–5.81)
RDW: 12.5 % (ref 11.5–15.5)
WBC: 6.8 10*3/uL (ref 4.0–10.5)

## 2014-09-06 LAB — LIPID PANEL
Cholesterol: 125 mg/dL (ref 0–200)
HDL: 42 mg/dL (ref >39)
LDL Cholesterol: 67 mg/dL (ref 0–99)
TRIGLYCERIDES: 82 mg/dL (ref <150)
Total CHOL/HDL Ratio: 3 Ratio
VLDL: 16 mg/dL (ref 0–40)

## 2014-09-06 LAB — HEMOGLOBIN A1C
Hgb A1c MFr Bld: 6.3 % — ABNORMAL HIGH (ref <5.7)
MEAN PLASMA GLUCOSE: 134 mg/dL — AB (ref <117)

## 2014-09-06 LAB — BASIC METABOLIC PANEL WITH GFR
BUN: 15 mg/dL (ref 6–23)
CO2: 27 meq/L (ref 19–32)
Calcium: 9 mg/dL (ref 8.4–10.5)
Chloride: 102 mEq/L (ref 96–112)
Creat: 1.26 mg/dL (ref 0.50–1.35)
GFR, EST NON AFRICAN AMERICAN: 60 mL/min
GFR, Est African American: 70 mL/min
Glucose, Bld: 143 mg/dL — ABNORMAL HIGH (ref 70–99)
Potassium: 4 mEq/L (ref 3.5–5.3)
SODIUM: 138 meq/L (ref 135–145)

## 2014-09-06 LAB — TSH: TSH: 1.244 u[IU]/mL (ref 0.350–4.500)

## 2014-09-06 LAB — VITAMIN D 25 HYDROXY (VIT D DEFICIENCY, FRACTURES): Vit D, 25-Hydroxy: 78 ng/mL (ref 30–100)

## 2014-09-06 LAB — MAGNESIUM: MAGNESIUM: 1.8 mg/dL (ref 1.5–2.5)

## 2014-09-06 MED ORDER — BUPROPION HCL ER (XL) 150 MG PO TB24: 150.0000 mg | ORAL_TABLET | ORAL | Status: DC

## 2014-09-06 NOTE — Patient Instructions (Signed)
Bad carbs also include fruit juice, alcohol, and sweet tea. These are empty calories that do not signal to your brain that you are full.   Please remember the good carbs are still carbs which convert into sugar. So please measure them out no more than 1/2-1 cup of rice, oatmeal, pasta, and beans  Veggies are however free foods! Pile them on.   Not all fruit is created equal. Please see the list below, the fruit at the bottom is higher in sugars than the fruit at the top. Please avoid all dried fruits.    We want weight loss that will last so you should lose 1-2 pounds a week.  THAT IS IT! Please pick THREE things a month to change. Once it is a habit check off the item. Then pick another three items off the list to become habits.  If you are already doing a habit on the list GREAT!  Cross that item off! o Don't drink your calories. Ie, alcohol, soda, fruit juice, and sweet tea.  o Drink more water. Drink a glass when you feel hungry or before each meal.  o Eat breakfast - Complex carb and protein (likeDannon light and fit yogurt, oatmeal, fruit, eggs, [place]urkey bacon). o Measure your cereal.  Eat no more than one cup a day. (ie [place]ashi) o Eat an Lewellyn a day. o Add a vegetable a day. o Try a new vegetable a month. o Use Pam! Stop using oil or butter to cook. o Don't finish your plate or use smaller plates. o Share your dessert. o Eat sugar free Jello for dessert or frozen grapes. o Don't eat 2-3 hours before bed. o Switch to whole wheat bread, pasta, and brown rice. o Make healthier choices when you eat out. No fries! o Pick baked chicken, NOT fried. o Don't forget to SLOW DOWN when you eat. It is not going anywhere.  o Take the stairs. o Park far away in the parking lot o State Farm[employer]ift soup cans (or weights) for 10 minutes while watching TV. o Walk at work for 10 minutes during break. o Walk outside 1 time a week with your friend, kids, dog, or significant other. o Start a walking group at  church. o Walk the mall as much as you can tolerate.  o Keep a food diary. o Weigh yourself daily. o Walk for 15 minutes 3 days per week. o Cook at home more often and eat out less.  If life happens and you go back to old habits, it is okay.  Just start over. You can do it!   If you experience chest pain, get short of breath, or tired during the exercise, please stop immediately and inform your doctor.    Information for patients with Gout  Gout defined-Gout occurs when urate crystals accumulate in your joint causing the inflammation and intense pain of gout attack.  Urate crystals can form when you have high levels of uric acid in your blood.  Your body produces uric acid when it breaks down prurines-substances that are found naturally in your body, as well as in certain foods such as organ meats, anchioves, herring, asparagus, and mushrooms.  Normally uric acid dissolves in your blood and passes through your kidneys into your urine.  But sometimes your body either produces too much uric acid or your kidneys excrete too little uric acid.  When this happens, uric acid can build up, forming sharp needle-like urate crystals in a joint or surrounding  tissue that cause pain, inflammation and swelling.    Gout is characterized by sudden, severe attacks of pain, redness and tenderness in joints, often the joint at the base of the big toe.  Gout is complex form of arthritis that can affect anyone.  Men are more likely to get gout but women become increasingly more susceptible to gout after menopause.  An acute attack of gout can wake you up in the middle of the night with the sensation that your big toe is on fire.  The affected joint is hot, swollen and so tender that even the weight or the sheet on it may seem intolerable.  If you experience symptoms of an acute gout attack it is important to your doctor as soon as the symptoms start.  Gout that goes untreated can lead to worsening pain and joint  damage.  Risk Factors:  You are more likely to develop gout if you have high levels of uric acid in your body.    Factors that increase the uric acid level in your body include:  Lifestyle factors.  Excessive alcohol use-generally more than two drinks a day for men and more than one for women increase the risk of gout.  Medical conditions.  Certain conditions make it more likely that you will develop gout.  These include hypertension, and chronic conditions such as diabetes, high levels of fat and cholesterol in the blood, and narrowing of the arteries.  Certain medications.  The uses of Thiazide diuretics- commonly used to treat hypertension and low dose aspirin can also increase uric acid levels.  Family history of gout.  If other members of your family have had gout, you are more likely to develop the disease.  Age and sex. Gout occurs more often in men than it does in women, primarily because women tend to have lower uric acid levels than men do.  Men are more likely to develop gout earlier usually between the ages of 240-50- whereas women generally develop signs and symptoms after menopause.    Tests and diagnosis:  Tests to help diagnose gout may include:  Blood test.  Your doctor may recommend a blood test to measure the uric acid level in your blood .  Blood tests can be misleading, though.  Some people have high uric acid levels but never experience gout.  And some people have signs and symptoms of gout, but don't have unusual levels of uric acid in their blood.  Joint fluid test.  Your doctor may use a needle to draw fluid from your affected joint.  When examined under the microscope, your joint fluid may reveal urate crystals.  Treatment:  Treatment for gout usually involves medications.  What medications you and your doctor choose will be based on your current health and other medications you currently take.  Gout medications can be used to treat acute gout attacks and prevent  future attacks as well as reduce your risk of complications from gout such as the development of tophi from urate crystal deposits.  Alternative medicine:   Certain foods have been studied for their potential to lower uric acid levels, including:  Coffee.  Studies have found an association between coffee drinking (regular and decaf) and lower uric acid levels.  The evidence is not enough to encourage non-coffee drinkers to start, but it may give clues to new ways of treating gout in the future.  Vitamin C.  Supplements containing vitamin C may reduce the levels of uric acid in your  blood.  However, vitamin as a treatment for gout. Don't assume that if a little vitamin C is good, than lots is better.  Megadoses of vitamin C may increase your bodies uric acid levels.  Cherries.  Cherries have been associated with lower levels of uric acid in studies, but it isn't clear if they have any effect on gout signs and symptoms.  Eating more cherries and other dar-colored fruits, such as blackberries, blueberries, purple grapes and raspberries, may be a safe way to support your gout treatment.    Lifestyle/Diet Recommendations:   Drink 8 to 16 cups ( about 2 to 4 liters) of fluid each day, with at least half being water.  Avoid alcohol  Eat a moderate amount of protein, preferably from healthy sources, such as low-fat or fat-free dairy, tofu, eggs, and nut butters.  Limit you daily intake of meat, fish, and poultry to 4 to 6 ounces.  Avoid high fat meats and desserts.  Decrease you intake of shellfish, beef, lamb, pork, eggs and cheese.  Choose a good source of vitamin C daily such as citrus fruits, strawberries, broccoli,  brussel sprouts, papaya, and cantaloupe.   Choose a good source of vitamin A every other day such as yellow fruits, or dark green/yellow vegetables.  Avoid drastic weigh reduction or fasting.  If weigh loss is desired lose it over a period of several months.  See "dietary  considerations.." chart for specific food recommendations.  Dietary Considerations for people with Gout  Food with negligible purine content (0-15 mg of purine nitrogen per 100 grams food)  May use as desired except on calorie variations  Non fat milk Cocoa Cereals (except in list II) Hard candies  Buttermilk Carbonated drinks Vegetables (except in list II) Sherbet  Coffee Fruits Sugar Honey  Tea Cottage Cheese Gelatin-jell-o Salt  Fruit juice Breads Angel food Cake   Herbs/spices Jams/Jellies [employer]    Foods that do not contain excessive purine content, but must be limited due to fat content  Cream Eggs Oil and Salad Dressing  Half and Half Peanut Butter Chocolate  Whole Milk Cakes Potato Chips  Butter Ice Cream Fried Foods  Cheese Nuts Waffles, pancakes   List II: Food with moderate purine content (50-150 mg of purine nitrogen per 100 grams of food)  Limit total amount each day to 5 oz. cooked Lean meat, other than those on list III   Poultry, other than those on list III Fish, other than those on list III   Seafood, other than those on list III  These foods may be used occasionally  Peas Lentils Bran  Spinach Oatmeal Dried Beans and Peas  Asparagus Wheat Germ Mushrooms   Additional information about meat choices  Choose fish and poultry, particularly without skin, often.  Select lean, well trimmed cuts of meat.  Avoid all fatty meats, bacon , sausage, fried meats, fried fish, or poultry, luncheon meats, cold cuts, hot dogs, meats canned or frozen in gravy, spareribs and frozen and packaged prepared meats.   List III: Foods with HIGH purine content / Foods to AVOID (150-800 mg of purine nitrogen per 100 grams of food)  Anchovies Herring Meat Broths  Liver Mackerel Meat Extracts  Kidney Scallops Meat Drippings  Sardines Wild Game Mincemeat  Sweetbreads Goose Gravy  Heart Tongue Yeast, baker's and brewers   Commercial soups made with any of the  foods listed in List II or List III  In addition avoid all alcoholic beverages

## 2014-09-06 NOTE — Progress Notes (Signed)
Assessment and Plan:  Hypertension: Continue medication, monitor blood pressure at home. Continue DASH diet.  Reminder to go to the ER if any CP, SOB, nausea, dizziness, severe HA, changes vision/speech, left arm numbness and tingling, and jaw pain. Cholesterol: Continue diet and exercise. Check cholesterol.  Pre-diabetes-Continue diet and exercise. Check A1C Vitamin D Def- check level and continue medications.  Anxiety- discussed medications, thinking of switching jobs next year to decrease stress, will send in wellbutrin and will start to take after the holidays.  Obesity with co morbidities- long discussion about weight loss, diet, and exercise Tdap  Continue diet and meds as discussed. Further disposition pending results of labs.  HPI 63 y.o. male  presents for 3 month follow up with hypertension, hyperlipidemia, prediabetes and vitamin D. His blood pressure has been controlled at home, atenolol 50mg , today their BP is BP: 138/80 mmHg He does not workout. He denies chest pain, shortness of breath, dizziness.  He is on cholesterol medication, pravastatin and denies myalgias. His cholesterol is at goal. The cholesterol last visit was:   Lab Results  Component Value Date   CHOL 110 05/29/2014   HDL 36* 05/29/2014   LDLCALC 54 05/29/2014   TRIG 100 05/29/2014   CHOLHDL 3.1 05/29/2014   He has been working on diet and exercise for prediabetes, and denies paresthesia of the feet, polydipsia and polyuria. Last A1C in the office was:  Lab Results  Component Value Date   HGBA1C 6.0* 05/29/2014   Patient is on Vitamin D supplement, 5000 IU.   Lab Results  Component Value Date   VD25OH 105* 05/29/2014     BMI is Body mass index is 37.02 kg/(m^2)., he is working on diet and exercise, states he has increased stress with work that causes him to eat more, he also helps his mom and a lot of his family. . Wt Readings from Last 3 Encounters:  09/06/14 258 lb (117.028 kg)  05/29/14 246 lb 6.4  oz (111.766 kg)     Current Medications:  Current Outpatient Prescriptions on File Prior to Visit  Medication Sig Dispense Refill  . aspirin 81 MG tablet Take 81 mg by mouth daily.    Marland Kitchen. atenolol (TENORMIN) 50 MG tablet TAKE 1 TABLET DAILY FOR    BLOOD PRESSURE 90 tablet 0  . Cholecalciferol (VITAMIN D-3) 5000 UNITS TABS Take 10,000 Units by mouth daily.    Marland Kitchen. loratadine (CLARITIN) 10 MG tablet Take 10 mg by mouth daily.    Marland Kitchen. omeprazole (PRILOSEC) 40 MG capsule TAKE 1 CAPSULE (40 MG TOTAL) BY MOUTH DAILY. 30 capsule 2  . pravastatin (PRAVACHOL) 40 MG tablet TAKE 1 TABLET DAILY FOR    CHOLESTEROL 90 tablet 3   No current facility-administered medications on file prior to visit.   Medical History:  Past Medical History  Diagnosis Date  . Hypertension   . Hyperlipidemia   . Pre-diabetes   . Morbid obesity   . Hypogonadism, male   . Vitamin D deficiency   . GERD (gastroesophageal reflux disease)   . Allergic rhinitis   . BPH (benign prostatic hyperplasia)   . Kidney stones    Allergies:  Allergies  Allergen Reactions  . Penicillins     REACTION: unknown     Review of Systems:  Review of Systems  Constitutional: Negative.   HENT: Negative.   Eyes: Negative.   Respiratory: Negative.   Cardiovascular: Negative.   Gastrointestinal: Negative.   Genitourinary: Negative.   Musculoskeletal: Negative.  Skin: Negative.   Neurological: Negative.   Endo/Heme/Allergies: Negative.   Psychiatric/Behavioral: Positive for depression. Negative for suicidal ideas, hallucinations, memory loss and substance abuse. The patient is nervous/anxious. The patient does not have insomnia.      Family history- Review and unchanged Social history- Review and unchanged Physical Exam: BP 138/80 mmHg  Pulse 68  Temp(Src) 97.7 F (36.5 C)  Resp 16  Wt 258 lb (117.028 kg) Wt Readings from Last 3 Encounters:  09/06/14 258 lb (117.028 kg)  05/29/14 246 lb 6.4 oz (111.766 kg)   General  Appearance: Well nourished, in no apparent distress. Eyes: PERRLA, EOMs, conjunctiva no swelling or erythema Sinuses: No Frontal/maxillary tenderness ENT/Mouth: Ext aud canals clear, TMs without erythema, bulging. No erythema, swelling, or exudate on post pharynx.  Tonsils not swollen or erythematous. Hearing normal.  Neck: Supple, thyroid normal.  Respiratory: Respiratory effort normal, BS equal bilaterally without rales, rhonchi, wheezing or stridor.  Cardio: RRR with no MRGs. Brisk peripheral pulses without edema.  Abdomen: Soft, + BS.  Non tender, no guarding, rebound, hernias, masses. Lymphatics: Non tender without lymphadenopathy.  Musculoskeletal: Full ROM, 5/5 strength, normal gait.  Skin: Warm, dry without rashes, lesions, ecchymosis.  Neuro: Cranial nerves intact. Normal muscle tone, no cerebellar symptoms. Sensation intact.  Psych: Awake and oriented X 3, normal affect, Insight and Judgment appropriate.    Quentin Mullingollier, Clarece Drzewiecki, PA-C 8:35 AM [facility]

## 2014-09-07 LAB — INSULIN, FASTING: INSULIN FASTING, SERUM: 22 u[IU]/mL — AB (ref 2.0–19.6)

## 2014-09-23 ENCOUNTER — Other Ambulatory Visit: Payer: Self-pay | Admitting: Internal Medicine

## 2014-11-11 ENCOUNTER — Other Ambulatory Visit: Payer: Self-pay | Admitting: *Deleted

## 2014-11-11 MED ORDER — ATENOLOL 50 MG PO TABS: ORAL_TABLET | ORAL | Status: DC

## 2014-12-12 ENCOUNTER — Encounter: Payer: Self-pay | Admitting: Internal Medicine

## 2014-12-12 ENCOUNTER — Ambulatory Visit (INDEPENDENT_AMBULATORY_CARE_PROVIDER_SITE_OTHER): Payer: BLUE CROSS/BLUE SHIELD | Admitting: Internal Medicine

## 2014-12-12 VITALS — BP 102/66 | HR 64 | Temp 97.7°F | Resp 16 | Ht 70.0 in | Wt 224.6 lb

## 2014-12-12 DIAGNOSIS — E782 Mixed hyperlipidemia: Secondary | ICD-10-CM

## 2014-12-12 DIAGNOSIS — R7303 Prediabetes: Secondary | ICD-10-CM

## 2014-12-12 DIAGNOSIS — Z6835 Body mass index (BMI) 35.0-35.9, adult: Secondary | ICD-10-CM | POA: Insufficient documentation

## 2014-12-12 DIAGNOSIS — E559 Vitamin D deficiency, unspecified: Secondary | ICD-10-CM

## 2014-12-12 DIAGNOSIS — K219 Gastro-esophageal reflux disease without esophagitis: Secondary | ICD-10-CM | POA: Insufficient documentation

## 2014-12-12 DIAGNOSIS — F3341 Major depressive disorder, recurrent, in partial remission: Secondary | ICD-10-CM | POA: Insufficient documentation

## 2014-12-12 DIAGNOSIS — I10 Essential (primary) hypertension: Secondary | ICD-10-CM

## 2014-12-12 DIAGNOSIS — F329 Major depressive disorder, single episode, unspecified: Secondary | ICD-10-CM

## 2014-12-12 DIAGNOSIS — R7309 Other abnormal glucose: Secondary | ICD-10-CM

## 2014-12-12 LAB — BASIC METABOLIC PANEL WITH GFR
BUN: 10 mg/dL (ref 6–23)
CALCIUM: 9.4 mg/dL (ref 8.4–10.5)
CO2: 27 mEq/L (ref 19–32)
Chloride: 104 mEq/L (ref 96–112)
Creat: 1.18 mg/dL (ref 0.50–1.35)
GFR, EST NON AFRICAN AMERICAN: 65 mL/min
GFR, Est African American: 75 mL/min
Glucose, Bld: 117 mg/dL — ABNORMAL HIGH (ref 70–99)
Potassium: 4 mEq/L (ref 3.5–5.3)
SODIUM: 140 meq/L (ref 135–145)

## 2014-12-12 LAB — CBC WITH DIFFERENTIAL/PLATELET
Basophils Absolute: 0 10*3/uL (ref 0.0–0.1)
Basophils Relative: 0 % (ref 0–1)
EOS ABS: 0.1 10*3/uL (ref 0.0–0.7)
EOS PCT: 3 % (ref 0–5)
HEMATOCRIT: 45.6 % (ref 39.0–52.0)
HEMOGLOBIN: 15.8 g/dL (ref 13.0–17.0)
LYMPHS PCT: 29 % (ref 12–46)
Lymphs Abs: 1.3 10*3/uL (ref 0.7–4.0)
MCH: 31.2 pg (ref 26.0–34.0)
MCHC: 34.6 g/dL (ref 30.0–36.0)
MCV: 90.1 fL (ref 78.0–100.0)
MPV: 9.8 fL (ref 8.6–12.4)
Monocytes Absolute: 0.5 10*3/uL (ref 0.1–1.0)
Monocytes Relative: 10 % (ref 3–12)
NEUTROS ABS: 2.7 10*3/uL (ref 1.7–7.7)
Neutrophils Relative %: 58 % (ref 43–77)
Platelets: 209 10*3/uL (ref 150–400)
RBC: 5.06 MIL/uL (ref 4.22–5.81)
RDW: 13.2 % (ref 11.5–15.5)
WBC: 4.6 10*3/uL (ref 4.0–10.5)

## 2014-12-12 LAB — TSH: TSH: 1.427 u[IU]/mL (ref 0.350–4.500)

## 2014-12-12 LAB — HEPATIC FUNCTION PANEL
ALT: 24 U/L (ref 0–53)
AST: 20 U/L (ref 0–37)
Albumin: 4.2 g/dL (ref 3.5–5.2)
Alkaline Phosphatase: 50 U/L (ref 39–117)
BILIRUBIN INDIRECT: 0.7 mg/dL (ref 0.2–1.2)
BILIRUBIN TOTAL: 0.9 mg/dL (ref 0.2–1.2)
Bilirubin, Direct: 0.2 mg/dL (ref 0.0–0.3)
TOTAL PROTEIN: 6.7 g/dL (ref 6.0–8.3)

## 2014-12-12 LAB — LIPID PANEL
CHOLESTEROL: 90 mg/dL (ref 0–200)
HDL: 39 mg/dL — ABNORMAL LOW (ref >=40)
LDL CALC: 38 mg/dL (ref 0–99)
TRIGLYCERIDES: 66 mg/dL (ref <150)
Total CHOL/HDL Ratio: 2.3 Ratio
VLDL: 13 mg/dL (ref 0–40)

## 2014-12-12 LAB — HEMOGLOBIN A1C
HEMOGLOBIN A1C: 5.8 % — AB (ref <5.7)
MEAN PLASMA GLUCOSE: 120 mg/dL — AB (ref <117)

## 2014-12-12 LAB — MAGNESIUM: Magnesium: 2 mg/dL (ref 1.5–2.5)

## 2014-12-12 NOTE — Patient Instructions (Addendum)
GETTING     OFF      PPI's  Nexium/protonix/prilosec are called PPI's, they are great at healing your stomach but should only be taken for a short period of time.   Studies are showing that taken for a long time it can increase the risk of osteoporosis (weakening of your bones), pneumonia, low magnesium, restless legs, Cdiff (infection that causes diarrhea), and most recently kidney disease/insufficiency.  Due to this information we want to try to stop the PPI but if you try to stop it abruptly this can cause rebound acid and worsening symptoms.   So this is how we want you to get off the PPI: - Start taking the nexium/protonix/prilosec or which every PPI you are on every other day for 2 week while starting to take pepcid or zantac (generic is fine) 2 x a day - then decrease the PPI to every 3 days for 2 weeks and then stop while continuing on the zantac or pepcid twice daily. - then you can try once at night for 2 weeks - you can continue on this once at night or stop all together - Avoid alcohol, spicy foods, NSAIDS (aleve, ibuprofen) at this time. See foods below.   Food Choices for Gastroesophageal Reflux Disease When you have gastroesophageal reflux disease (GERD), the foods you eat and your eating habits are very important. Choosing the right foods can help ease the discomfort of GERD. WHAT GENERAL GUIDELINES DO I NEED TO FOLLOW?  Choose fruits, vegetables, whole grains, low-fat dairy products, and low-fat meat, fish, and poultry.  Limit fats such as oils, salad dressings, butter, nuts, and avocado.  Keep a food diary to identify foods that cause symptoms.  Avoid foods that cause reflux. These may be different for different people.  Eat frequent small meals instead of three large meals each day.  Eat your meals slowly, in a relaxed setting.  Limit fried foods.  Cook foods using methods other than frying.  Avoid drinking alcohol.  Avoid drinking large amounts of liquids  with your meals.  Avoid bending over or lying down until 2-3 hours after eating. WHAT FOODS ARE NOT RECOMMENDED? The following are some foods and drinks that may worsen your symptoms: Vegetables Tomatoes. Tomato juice. Tomato and spaghetti sauce. Chili peppers. Onion and garlic. Horseradish. Fruits Oranges, grapefruit, and lemon (fruit and juice). Meats High-fat meats, fish, and poultry. This includes hot dogs, ribs, ham, sausage, salami, and bacon. Dairy Whole milk and chocolate milk. Sour cream. Cream. Butter. Ice cream. Cream cheese.  Beverages Coffee and tea, with or without caffeine. Carbonated beverages or energy drinks. Condiments Hot sauce. Barbecue sauce.  Sweets/Desserts Chocolate and cocoa. Donuts. Peppermint and spearmint. Fats and Oils High-fat foods, including [place] fries and potato chips. Other Vinegar. Strong spices, such as black pepper, white pepper, red pepper, cayenne, curry powder, cloves, ginger, and chili powder.  +++++++++++++++++++++++++++++++++++++++ Recommend the book "The END of DIETING" by Dr Monico HoarJoel Fuhrman   & the book "The END of DIABETES " by Dr Monico HoarJoel Fuhrman  At [email or web address] - get book & Audio CD's      Being diabetic has a  300% increased risk for heart attack, stroke, cancer, and alzheimer- type vascular dementia. It is very important that you work harder with diet by avoiding all foods that are white. Avoid white rice (brown & wild rice is OK), white potatoes (sweetpotatoes in moderation is OK), White bread or wheat bread or anything made out of white flour like bagels, donuts,  rolls, buns, biscuits, cakes, pastries, cookies, pizza crust, and pasta (made from white flour & egg whites) - vegetarian pasta or spinach or wheat pasta is OK. Multigrain breads like Arnold's or Pepperidge Farm, or multigrain sandwich thins or flatbreads.  Diet, exercise and weight loss can reverse and cure diabetes in the early stages.  Diet, exercise and weight loss is very  important in the control and prevention of complications of diabetes which affects every system in your body, ie. Brain - dementia/stroke, eyes - glaucoma/blindness, heart - heart attack/heart failure, kidneys - dialysis, stomach - gastric paralysis, intestines - malabsorption, nerves - severe painful neuritis, circulation - gangrene & loss of a leg(s), and finally cancer and Alzheimers.    I recommend avoid fried & greasy foods,  sweets/candy, white rice (brown or wild rice or Quinoa is OK), white potatoes (sweet potatoes are OK) - anything made from white flour - bagels, doughnuts, rolls, buns, biscuits,white and wheat breads, pizza crust and traditional pasta made of white flour & egg white(vegetarian pasta or spinach or wheat pasta is OK).  Multi-grain bread is OK - like multi-grain flat bread or sandwich thins. Avoid alcohol in excess. Exercise is also important.    Eat all the vegetables you want - avoid meat, especially red meat and dairy - especially cheese.  Cheese is the most concentrated form of trans-fats which is the worst thing to clog up our arteries. Veggie cheese is OK which can be found in the fresh produce section at [facility] or [employer] or [employer]

## 2014-12-12 NOTE — Progress Notes (Signed)
Patient ID: Alex Lewis, male   DOB: 07-30-1951, 64 y.o.   MRN: [id number]   This very nice 64 y.o. MWM presents for 3 month follow up with Hypertension, Hyperlipidemia, Pre-Diabetes and Vitamin D Deficiency.    Patient is treated for HTN & BP has been controlled at home. Today's BP: 102/66 mmHg. Patient has had no complaints of any cardiac type chest pain, palpitations, dyspnea/orthopnea/PND, dizziness, claudication, or dependent edema.   Hyperlipidemia is controlled with diet & meds. Patient denies myalgias or other med SE's. Last Lipids wereat goal -  Total Chol  125; HDL 42; LDL  67; Trig 82 on 09/06/2014.   Also, the patient has history of Morbid Obesity (BMI 32.23) and consequent PreDiabetes since 2011 with A1c 6.2% and has had no symptoms of reactive hypoglycemia, diabetic polys, paresthesias or visual blurring.  Last A1c was 6.3% on 09/06/2014. Patient has finally gotten serious with his diet and is in a commercial weight loss program with his wife & he's lost 22# in the last 3 months !!!   Further, the patient also has history of Vitamin D Deficiency of 16 in 2008 and supplements vitamin D without any suspected side-effects. Last vitamin D was 78 on 09/06/2014.  Medication Sig  . aspirin 81 MG tablet Take 81 mg by mouth daily.  Marland Kitchen atenolol  50 MG tablet TAKE 1 TABLET DAILY FOR    BLOOD PRESSURE  . buPROPion  XL 150 MG 24 hr tablet Take 1 tablet (150 mg total) by mouth every morning.  Marland Kitchen VITAMIN D 5000 UNITS TABS Take 10,000 Units by mouth daily.  Marland Kitchen loratadine  10 MG tablet Take 10 mg by mouth daily.  Marland Kitchen omeprazole  40 MG capsule TAKE 1 CAPSULE BY MOUTH ONCE DAILY  . pravastatin  40 MG tablet TAKE 1 TABLET DAILY FOR    CHOLESTEROL   Allergies  Allergen Reactions  . Penicillins     REACTION: unknown   PMHx:   Past Medical History  Diagnosis Date  . Hypertension   . Hyperlipidemia   . Pre-diabetes   . Morbid obesity   . Hypogonadism, male   . Vitamin D deficiency   . GERD  (gastroesophageal reflux disease)   . Allergic rhinitis   . BPH (benign prostatic hyperplasia)   . Kidney stones    Immunization History  Administered Date(s) Administered  . DTaP 01/26/2000  . Influenza Whole 06/22/2013  . Influenza-Unspecified 07/13/2014  . PPD Test 05/29/2014  . Tdap 09/06/2014  . Zoster 04/22/2013   Past Surgical History  Procedure Laterality Date  . Knee surgery Right 1977  . Flexible sigmoidoscopy  1992  . Tonsillectomy and adenoidectomy  1960   FHx:    Reviewed / unchanged  SHx:    Reviewed / unchanged  Systems Review:  Constitutional: Denies fever, chills, wt changes, headaches, insomnia, fatigue, night sweats, change in appetite. Eyes: Denies redness, blurred vision, diplopia, discharge, itchy, watery eyes.  ENT: Denies discharge, congestion, post nasal drip, epistaxis, sore throat, earache, hearing loss, dental pain, tinnitus, vertigo, sinus pain, snoring.  CV: Denies chest pain, palpitations, irregular heartbeat, syncope, dyspnea, diaphoresis, orthopnea, PND, claudication or edema. Respiratory: denies cough, dyspnea, DOE, pleurisy, hoarseness, laryngitis, wheezing.  Gastrointestinal: Denies dysphagia, odynophagia, heartburn, reflux, water brash, abdominal pain or cramps, nausea, vomiting, bloating, diarrhea, constipation, hematemesis, melena, hematochezia  or hemorrhoids. Genitourinary: Denies dysuria, frequency, urgency, nocturia, hesitancy, discharge, hematuria or flank pain. Musculoskeletal: Denies arthralgias, myalgias, stiffness, jt. swelling, pain, limping or strain/sprain.  Skin:  Denies pruritus, rash, hives, warts, acne, eczema or change in skin lesion(s). Neuro: No weakness, tremor, incoordination, spasms, paresthesia or pain. Psychiatric: Denies confusion, memory loss or sensory loss. Endo: Denies change in weight, skin or hair change.  Heme/Lymph: No excessive bleeding, bruising or enlarged lymph nodes.  Physical Exam  BP 102/66   Pulse  64  Temp 97.7 F  Resp 16  Ht 5\' 10"    Wt 224 lb 9.6 oz     BMI 32.23   Appears well nourished and in no distress. Eyes: PERRLA, EOMs, conjunctiva no swelling or erythema. Sinuses: No frontal/maxillary tenderness ENT/Mouth: EAC's clear, TM's nl w/o erythema, bulging. Nares clear w/o erythema, swelling, exudates. Oropharynx clear without erythema or exudates. Oral hygiene is good. Tongue normal, non obstructing. Hearing intact.  Neck: Supple. Thyroid nl. Car 2+/2+ without bruits, nodes or JVD. Chest: Respirations nl with BS clear & equal w/o rales, rhonchi, wheezing or stridor.  Cor: Heart sounds normal w/ regular rate and rhythm without sig. murmurs, gallops, clicks, or rubs. Peripheral pulses normal and equal  without edema.  Abdomen: Soft & bowel sounds normal. Non-tender w/o guarding, rebound, hernias, masses, or organomegaly.  Lymphatics: Unremarkable.  Musculoskeletal: Full ROM all peripheral extremities, joint stability, 5/5 strength, and normal gait.  Skin: Warm, dry without exposed rashes, lesions or ecchymosis apparent.  Neuro: Cranial nerves intact, reflexes equal bilaterally. Sensory-motor testing grossly intact. Tendon reflexes grossly intact.  Pysch: Alert & oriented x 3.  Insight and judgement nl & appropriate. No ideations.  Assessment and Plan:  1. Essential hypertension  - TSH  2. Hyperlipidemia  - Lipid panel  3. Prediabetes  - Hemoglobin A1c - Insulin, random  4. Vitamin D deficiency  - Vit D  25 hydroxy (rtn osteoporosis monitoring)  5. Gastroesophageal reflux disease, esophagitis presence not specified  - CBC with Differential/Platelet - BASIC METABOLIC PANEL WITH GFR - Hepatic function panel - Magnesium  6. Morbid obesity   Recommended regular exercise, BP monitoring, weight control, and discussed med and SE's. Recommended labs to assess and monitor clinical status. Further disposition pending results of labs.

## 2014-12-13 LAB — VITAMIN D 25 HYDROXY (VIT D DEFICIENCY, FRACTURES): VIT D 25 HYDROXY: 90 ng/mL (ref 30–100)

## 2014-12-13 LAB — INSULIN, RANDOM: Insulin: 11.9 u[IU]/mL (ref 2.0–19.6)

## 2015-01-06 ENCOUNTER — Telehealth: Payer: Self-pay

## 2015-01-06 ENCOUNTER — Encounter: Payer: Self-pay | Admitting: Internal Medicine

## 2015-01-06 ENCOUNTER — Ambulatory Visit (INDEPENDENT_AMBULATORY_CARE_PROVIDER_SITE_OTHER): Payer: BLUE CROSS/BLUE SHIELD | Admitting: Internal Medicine

## 2015-01-06 VITALS — BP 126/68 | HR 68 | Temp 98.4°F | Resp 18 | Ht 70.0 in | Wt 224.0 lb

## 2015-01-06 DIAGNOSIS — J069 Acute upper respiratory infection, unspecified: Secondary | ICD-10-CM

## 2015-01-06 MED ORDER — BENZONATATE 100 MG PO CAPS: 100.0000 mg | ORAL_CAPSULE | Freq: Four times a day (QID) | ORAL | Status: DC | PRN

## 2015-01-06 MED ORDER — CIPROFLOXACIN-DEXAMETHASONE 0.3-0.1 % OT SUSP: 4.0000 [drp] | Freq: Two times a day (BID) | OTIC | Status: DC

## 2015-01-06 MED ORDER — PROMETHAZINE-CODEINE 6.25-10 MG/5ML PO SYRP: 5.0000 mL | ORAL_SOLUTION | Freq: Four times a day (QID) | ORAL | Status: DC | PRN

## 2015-01-06 MED ORDER — AZITHROMYCIN 250 MG PO TABS: ORAL_TABLET | ORAL | Status: DC

## 2015-01-06 MED ORDER — PREDNISONE 20 MG PO TABS: ORAL_TABLET | ORAL | Status: DC

## 2015-01-06 NOTE — Patient Instructions (Signed)
Upper Respiratory Infection, Adult An upper respiratory infection (URI) is also sometimes known as the common cold. The upper respiratory tract includes the nose, sinuses, throat, trachea, and bronchi. Bronchi are the airways leading to the lungs. Most people improve within 1 week, but symptoms can last up to 2 weeks. A residual cough may last even longer.  CAUSES Many different viruses can infect the tissues lining the upper respiratory tract. The tissues become irritated and inflamed and often become very moist. Mucus production is also common. A cold is contagious. You can easily spread the virus to others by oral contact. This includes kissing, sharing a glass, coughing, or sneezing. Touching your mouth or nose and then touching a surface, which is then touched by another person, can also spread the virus. SYMPTOMS  Symptoms typically develop 1 to 3 days after you come in contact with a cold virus. Symptoms vary from person to person. They may include:  Runny nose.  Sneezing.  Nasal congestion.  Sinus irritation.  Sore throat.  Loss of voice (laryngitis).  Cough.  Fatigue.  Muscle aches.  Loss of appetite.  Headache.  Low-grade fever. DIAGNOSIS  You might diagnose your own cold based on familiar symptoms, since most people get a cold 2 to 3 times a year. Your caregiver can confirm this based on your exam. Most importantly, your caregiver can check that your symptoms are not due to another disease such as strep throat, sinusitis, pneumonia, asthma, or epiglottitis. Blood tests, throat tests, and X-rays are not necessary to diagnose a common cold, but they may sometimes be helpful in excluding other more serious diseases. Your caregiver will decide if any further tests are required. RISKS AND COMPLICATIONS  You may be at risk for a more severe case of the common cold if you smoke cigarettes, have chronic heart disease (such as heart failure) or lung disease (such as asthma), or if  you have a weakened immune system. The very young and very old are also at risk for more serious infections. Bacterial sinusitis, middle ear infections, and bacterial pneumonia can complicate the common cold. The common cold can worsen asthma and chronic obstructive pulmonary disease (COPD). Sometimes, these complications can require emergency medical care and may be life-threatening. PREVENTION  The best way to protect against getting a cold is to practice good hygiene. Avoid oral or hand contact with people with cold symptoms. Wash your hands often if contact occurs. There is no clear evidence that vitamin C, vitamin E, echinacea, or exercise reduces the chance of developing a cold. However, it is always recommended to get plenty of rest and practice good nutrition. TREATMENT  Treatment is directed at relieving symptoms. There is no cure. Antibiotics are not effective, because the infection is caused by a virus, not by bacteria. Treatment may include:  Increased fluid intake. Sports drinks offer valuable electrolytes, sugars, and fluids.  Breathing heated mist or steam (vaporizer or shower).  Eating chicken soup or other clear broths, and maintaining good nutrition.  Getting plenty of rest.  Using gargles or lozenges for comfort.  Controlling fevers with ibuprofen or acetaminophen as directed by your caregiver.  Increasing usage of your inhaler if you have asthma. Zinc gel and zinc lozenges, taken in the first 24 hours of the common cold, can shorten the duration and lessen the severity of symptoms. Pain medicines may help with fever, muscle aches, and throat pain. A variety of non-prescription medicines are available to treat congestion and runny nose. Your caregiver   can make recommendations and may suggest nasal or lung inhalers for other symptoms.  HOME CARE INSTRUCTIONS   Only take over-the-counter or prescription medicines for pain, discomfort, or fever as directed by your  caregiver.  Use a warm mist humidifier or inhale steam from a shower to increase air moisture. This may keep secretions moist and make it easier to breathe.  Drink enough water and fluids to keep your urine clear or pale yellow.  Rest as needed.  Return to work when your temperature has returned to normal or as your caregiver advises. You may need to stay home longer to avoid infecting others. You can also use a face mask and careful hand washing to prevent spread of the virus. SEEK MEDICAL CARE IF:   After the first few days, you feel you are getting worse rather than better.  You need your caregiver's advice about medicines to control symptoms.  You develop chills, worsening shortness of breath, or brown or red sputum. These may be signs of pneumonia.  You develop yellow or brown nasal discharge or pain in the face, especially when you bend forward. These may be signs of sinusitis.  You develop a fever, swollen neck glands, pain with swallowing, or white areas in the back of your throat. These may be signs of strep throat. SEEK IMMEDIATE MEDICAL CARE IF:   You have a fever.  You develop severe or persistent headache, ear pain, sinus pain, or chest pain.  You develop wheezing, a prolonged cough, cough up blood, or have a change in your usual mucus (if you have chronic lung disease).  You develop sore muscles or a stiff neck. Document Released: 03/09/2001 Document Revised: 12/06/2011 Document Reviewed: 12/19/2013 Exit[employer] Patient Information 2015 [employer]. This information is not intended to replace advice given to you by your health care provider. Make sure you discuss any questions you have with your health care provider.  

## 2015-01-06 NOTE — Progress Notes (Signed)
Patient ID: Alex DarbyLewis E Alex, male   DOB: 09/24/1951, 64 y.o.   MRN: [id number]  HPI  Patient presents to the office for evaluation of cough.  It has been going on for 1 weeks.  Patient reports night > day, dry, barky, worse with exercise, worse with exposure to cold air, worse with lying down.  They also endorse change in voice, chills, postnasal drip, shortness of breath, wheezing and sinus pressure, congestion, right ear pain, sore throat..  They have tried antitussives, antihistamines, upright position or hydration.  They report that nothing has worked.  They admits to other sick contacts.  Review of Systems  Constitutional: Positive for chills. Negative for fever and malaise/fatigue.  HENT: Positive for congestion, ear pain and sore throat. Negative for ear discharge, nosebleeds and tinnitus.   Eyes: Negative.   Respiratory: Positive for cough. Negative for sputum production, shortness of breath and wheezing.   Cardiovascular: Negative for chest pain and leg swelling.  Skin: Negative.   Neurological: Positive for headaches.    PE:  General:  Alert and non-toxic, WDWN, NAD HEENT: NCAT, PERLA, EOM normal, no occular discharge or erythema.  Nasal mucosal edema with sinus tenderness to palpation.  Oropharynx clear with minimal oropharyngeal edema and erythema.  Mucous membranes moist and pink. Neck:  Cervical adenopathy Chest:  RRR no MRGs.  Lungs clear to auscultation A&P with no rhonchi or rales.   Abdomen: +BS x 4 quadrants, soft, non-tender, no guarding, rigidity, or rebound. Skin: warm and dry no rash Neuro: A&Ox4, CN II-XII grossly intact  Assessment and Plan:   1. Acute URI  Likely viral vs. Allergic rhinitis.  Try symptomatic treatment.  If no improvement will then do zpak.  Patient to f/u prn or for worsening symptoms.    - predniSONE (DELTASONE) 20 MG tablet; 3 tabs po day one, then 2 tabs daily x 4 days  Dispense: 11 tablet; Refill: 0 - benzonatate (TESSALON PERLES) 100 MG  capsule; Take 1 capsule (100 mg total) by mouth every 6 (six) hours as needed for cough.  Dispense: 30 capsule; Refill: 1 - ciprofloxacin-dexamethasone (CIPRODEX) otic suspension; Place 4 drops into the right ear 2 (two) times daily.  Dispense: 7.5 mL; Refill: 0 - promethazine-codeine (PHENERGAN WITH CODEINE) 6.25-10 MG/5ML syrup; Take 5 mLs by mouth every 6 (six) hours as needed for cough.  Dispense: 120 mL; Refill: 0 - azithromycin (ZITHROMAX Z-PAK) 250 MG tablet; 2 po day one, then 1 daily x 4 days  Dispense: 5 tablet; Refill: 0

## 2015-01-07 ENCOUNTER — Encounter: Payer: Self-pay | Admitting: Internal Medicine

## 2015-01-07 ENCOUNTER — Other Ambulatory Visit: Payer: Self-pay | Admitting: Internal Medicine

## 2015-01-07 MED ORDER — NEOMYCIN-POLYMYXIN-HC 3.5-10000-1 OT SUSP: 4.0000 [drp] | Freq: Four times a day (QID) | OTIC | Status: DC

## 2015-01-08 ENCOUNTER — Other Ambulatory Visit: Payer: Self-pay

## 2015-01-08 MED ORDER — OMEPRAZOLE 40 MG PO CPDR: 40.0000 mg | DELAYED_RELEASE_CAPSULE | Freq: Every day | ORAL | Status: DC

## 2015-01-23 NOTE — Telephone Encounter (Signed)
ERROR

## 2015-01-27 ENCOUNTER — Encounter: Payer: Self-pay | Admitting: Internal Medicine

## 2015-03-28 ENCOUNTER — Ambulatory Visit: Payer: Self-pay | Admitting: Internal Medicine

## 2015-04-02 ENCOUNTER — Encounter: Payer: Self-pay | Admitting: Internal Medicine

## 2015-04-02 ENCOUNTER — Ambulatory Visit (INDEPENDENT_AMBULATORY_CARE_PROVIDER_SITE_OTHER): Payer: BLUE CROSS/BLUE SHIELD | Admitting: Internal Medicine

## 2015-04-02 VITALS — BP 110/64 | HR 68 | Temp 98.2°F | Resp 18 | Ht 70.0 in | Wt 198.0 lb

## 2015-04-02 DIAGNOSIS — E663 Overweight: Secondary | ICD-10-CM

## 2015-04-02 DIAGNOSIS — Z79899 Other long term (current) drug therapy: Secondary | ICD-10-CM

## 2015-04-02 DIAGNOSIS — R7303 Prediabetes: Secondary | ICD-10-CM

## 2015-04-02 DIAGNOSIS — R7309 Other abnormal glucose: Secondary | ICD-10-CM

## 2015-04-02 DIAGNOSIS — E782 Mixed hyperlipidemia: Secondary | ICD-10-CM

## 2015-04-02 DIAGNOSIS — E559 Vitamin D deficiency, unspecified: Secondary | ICD-10-CM

## 2015-04-02 DIAGNOSIS — I10 Essential (primary) hypertension: Secondary | ICD-10-CM

## 2015-04-02 LAB — HEMOGLOBIN A1C
Hgb A1c MFr Bld: 5.5 % (ref <5.7)
Mean Plasma Glucose: 111 mg/dL (ref <117)

## 2015-04-02 LAB — BASIC METABOLIC PANEL WITH GFR
BUN: 14 mg/dL (ref 6–23)
CO2: 27 mEq/L (ref 19–32)
Calcium: 8.8 mg/dL (ref 8.4–10.5)
Chloride: 106 mEq/L (ref 96–112)
Creat: 0.91 mg/dL (ref 0.50–1.35)
GFR, Est African American: >89 mL/min
GFR, Est Non African American: 89 mL/min
Glucose, Bld: 108 mg/dL — ABNORMAL HIGH (ref 70–99)
Potassium: 3.9 mEq/L (ref 3.5–5.3)
SODIUM: 142 meq/L (ref 135–145)

## 2015-04-02 LAB — CBC WITH DIFFERENTIAL/PLATELET
Basophils Absolute: 0 10*3/uL (ref 0.0–0.1)
Basophils Relative: 0 % (ref 0–1)
Eosinophils Absolute: 0.1 10*3/uL (ref 0.0–0.7)
Eosinophils Relative: 2 % (ref 0–5)
HEMATOCRIT: 41.6 % (ref 39.0–52.0)
HEMOGLOBIN: 14.1 g/dL (ref 13.0–17.0)
LYMPHS ABS: 1.4 10*3/uL (ref 0.7–4.0)
LYMPHS PCT: 32 % (ref 12–46)
MCH: 30.9 pg (ref 26.0–34.0)
MCHC: 33.9 g/dL (ref 30.0–36.0)
MCV: 91.2 fL (ref 78.0–100.0)
MONO ABS: 0.4 10*3/uL (ref 0.1–1.0)
MONOS PCT: 9 % (ref 3–12)
MPV: 9.8 fL (ref 8.6–12.4)
NEUTROS ABS: 2.6 10*3/uL (ref 1.7–7.7)
NEUTROS PCT: 57 % (ref 43–77)
Platelets: 200 10*3/uL (ref 150–400)
RBC: 4.56 MIL/uL (ref 4.22–5.81)
RDW: 14.5 % (ref 11.5–15.5)
WBC: 4.5 10*3/uL (ref 4.0–10.5)

## 2015-04-02 LAB — HEPATIC FUNCTION PANEL
ALK PHOS: 54 U/L (ref 39–117)
ALT: 17 U/L (ref 0–53)
AST: 18 U/L (ref 0–37)
Albumin: 3.9 g/dL (ref 3.5–5.2)
BILIRUBIN DIRECT: 0.2 mg/dL (ref 0.0–0.3)
Indirect Bilirubin: 0.5 mg/dL (ref 0.2–1.2)
Total Bilirubin: 0.7 mg/dL (ref 0.2–1.2)
Total Protein: 6 g/dL (ref 6.0–8.3)

## 2015-04-02 LAB — LIPID PANEL
CHOL/HDL RATIO: 2.9 ratio
Cholesterol: 121 mg/dL (ref 0–200)
HDL: 42 mg/dL (ref >=40)
LDL CALC: 64 mg/dL (ref 0–99)
TRIGLYCERIDES: 75 mg/dL (ref <150)
VLDL: 15 mg/dL (ref 0–40)

## 2015-04-02 LAB — MAGNESIUM: Magnesium: 2 mg/dL (ref 1.5–2.5)

## 2015-04-02 LAB — TSH: TSH: 1.151 u[IU]/mL (ref 0.350–4.500)

## 2015-04-02 NOTE — Progress Notes (Signed)
Patient ID: Rachael Darby, male   DOB: 1951/04/10, 64 y.o.   MRN: [id number]  Assessment and Plan:  Hypertension:  -Discontinue medication,  -monitor blood pressure at home.  -Continue DASH diet.   -Reminder to go to the ER if any CP, SOB, nausea, dizziness, severe HA, changes vision/speech, left arm numbness and tingling, and jaw pain.  Cholesterol: -Continue diet and exercise.  -discontinue meds -Check cholesterol.   Pre-diabetes: -Continue diet and exercise.  -Check A1C  Vitamin D Def: -check level -continue medications.   Continue diet and meds as discussed. Further disposition pending results of labs.  HPI 64 y.o. male  presents for 3 month follow up with hypertension, hyperlipidemia, prediabetes and vitamin D.   His blood pressure has been controlled at home, today their BP is BP: 110/64 mmHg.   He does workout.  He reports that he is doing a lot of walking.  He reports that it is approximately 1-2 miles daily.  He also tends to do a lot of yard work.   He denies chest pain, shortness of breath, dizziness.   He is not on cholesterol medication and denies myalgias. His cholesterol is at goal. The cholesterol last visit was:   Lab Results  Component Value Date   CHOL 90 12/12/2014   HDL 39* 12/12/2014   LDLCALC 38 12/12/2014   TRIG 66 12/12/2014   CHOLHDL 2.3 12/12/2014     He has been working on diet and exercise for prediabetes, and denies foot ulcerations, hyperglycemia, hypoglycemia , increased appetite, nausea, paresthesia of the feet, polydipsia, polyuria, visual disturbances, vomiting and weight loss. Last A1C in the office was:  Lab Results  Component Value Date   HGBA1C 5.8* 12/12/2014    Patient is on Vitamin D supplement.  Lab Results  Component Value Date   VD25OH 90 12/12/2014     He reports that he is doing very well.  He is still on the [place] diet and he is having excellent success.    Current Medications:  Current Outpatient Prescriptions on  File Prior to Visit  Medication Sig Dispense Refill  . Cholecalciferol (VITAMIN D-3) 5000 UNITS TABS Take 10,000 Units by mouth daily.    Marland Kitchen loratadine (CLARITIN) 10 MG tablet Take 10 mg by mouth daily.     No current facility-administered medications on file prior to visit.    Medical History:  Past Medical History  Diagnosis Date  . Hypertension   . Hyperlipidemia   . Pre-diabetes   . Morbid obesity   . Hypogonadism, male   . Vitamin D deficiency   . GERD (gastroesophageal reflux disease)   . Allergic rhinitis   . BPH (benign prostatic hyperplasia)   . Kidney stones     Allergies:  Allergies  Allergen Reactions  . Penicillins     REACTION: unknown     Review of Systems:  Review of Systems  Constitutional: Negative for fever, chills and malaise/fatigue.  HENT: Negative for congestion, ear discharge, sore throat and tinnitus.   Eyes: Negative.   Respiratory: Negative for cough, shortness of breath and wheezing.   Cardiovascular: Negative for chest pain, palpitations and leg swelling.  Gastrointestinal: Negative for heartburn, nausea, vomiting, abdominal pain, diarrhea, constipation, blood in stool and melena.  Genitourinary: Negative.   Skin: Negative.   Neurological: Negative for dizziness, sensory change, loss of consciousness and headaches.  Psychiatric/Behavioral: Negative for depression. The patient is not nervous/anxious and does not have insomnia.     Family history- Review  and unchanged  Social history- Review and unchanged  Physical Exam: BP 110/64 mmHg  Pulse 68  Temp(Src) 98.2 F (36.8 C) (Temporal)  Resp 18  Ht 5\' 10"  (1.778 m)  Wt 198 lb (89.812 kg)  BMI 28.41 kg/m2 Wt Readings from Last 3 Encounters:  04/02/15 198 lb (89.812 kg)  01/06/15 224 lb (101.606 kg)  12/12/14 224 lb 9.6 oz (101.878 kg)    General Appearance: Well nourished well developed, in no apparent distress. Eyes: PERRLA, EOMs, conjunctiva no swelling or erythema ENT/Mouth:  Ear canals normal without obstruction, swelling, erythma, discharge.  TMs normal bilaterally.  Oropharynx moist, clear, without exudate, or postoropharyngeal swelling. Neck: Supple, thyroid normal,no cervical adenopathy  Respiratory: Respiratory effort normal, Breath sounds clear A&P without rhonchi, wheeze, or rale.  No retractions, no accessory usage. Cardio: RRR with no MRGs. Brisk peripheral pulses without edema.  Abdomen: Soft, + BS,  Non tender, no guarding, rebound, hernias, masses. Musculoskeletal: Full ROM, 5/5 strength, Normal gait Skin: Warm, dry without rashes, lesions, ecchymosis.  Neuro: Awake and oriented X 3, Cranial nerves intact. Normal muscle tone, no cerebellar symptoms. Psych: Normal affect, Insight and Judgment appropriate.    FORCUCCI, Adaley Kiene, PA-C 10:01 AM [facility]

## 2015-04-03 LAB — VITAMIN D 25 HYDROXY (VIT D DEFICIENCY, FRACTURES): Vit D, 25-Hydroxy: 88 ng/mL (ref 30–100)

## 2015-04-03 LAB — INSULIN, RANDOM: INSULIN: 8 u[IU]/mL (ref 2.0–19.6)

## 2015-05-14 ENCOUNTER — Ambulatory Visit (INDEPENDENT_AMBULATORY_CARE_PROVIDER_SITE_OTHER): Payer: BLUE CROSS/BLUE SHIELD | Admitting: Physician Assistant

## 2015-05-14 ENCOUNTER — Encounter: Payer: Self-pay | Admitting: Physician Assistant

## 2015-05-14 VITALS — BP 124/76 | HR 64 | Temp 97.5°F | Resp 16 | Ht 70.0 in | Wt 191.6 lb

## 2015-05-14 DIAGNOSIS — J069 Acute upper respiratory infection, unspecified: Secondary | ICD-10-CM | POA: Diagnosis not present

## 2015-05-14 MED ORDER — PREDNISONE 20 MG PO TABS: ORAL_TABLET | ORAL | Status: DC

## 2015-05-14 MED ORDER — AZITHROMYCIN 250 MG PO TABS: ORAL_TABLET | ORAL | Status: AC

## 2015-05-14 MED ORDER — PROMETHAZINE-CODEINE 6.25-10 MG/5ML PO SYRP: 5.0000 mL | ORAL_SOLUTION | Freq: Four times a day (QID) | ORAL | Status: DC | PRN

## 2015-05-14 NOTE — Progress Notes (Signed)
   Subjective:    Patient ID: Rachael Darby, male    DOB: 1951/08/04, 64 y.o.   MRN: [id number]  HPI 64 y.o. WM with history of HTN, preDM, chol presents with cold symptoms x Monday night. he has sore throat, sinus congestion, teeth pain on right side and sinus pain, felt he had a fever and chills yesterday. Has been taking vitamin C, netipot, cough pills, aleve.   Blood pressure 124/76, pulse 64, temperature 97.5 F (36.4 C), resp. rate 16, height  (1.778 m), weight 191 lb 9.6 oz (86.909 kg). Wt Readings from Last 3 Encounters:  05/14/15 191 lb 9.6 oz (86.909 kg)  04/02/15 198 lb (89.812 kg)  01/06/15 224 lb (101.606 kg)   Current Outpatient Prescriptions on File Prior to Visit  Medication Sig Dispense Refill  . Cholecalciferol (VITAMIN D-3) 5000 UNITS TABS Take 10,000 Units by mouth daily.    Marland Kitchen loratadine (CLARITIN) 10 MG tablet Take 10 mg by mouth daily.     No current facility-administered medications on file prior to visit.   Past Medical History  Diagnosis Date  . Hypertension   . Hyperlipidemia   . Pre-diabetes   . Morbid obesity   . Hypogonadism, male   . Vitamin D deficiency   . GERD (gastroesophageal reflux disease)   . Allergic rhinitis   . BPH (benign prostatic hyperplasia)   . Kidney stones    Review of Systems  Constitutional: Positive for fever and chills. Negative for diaphoresis.  HENT: Positive for congestion, sinus pressure, sneezing and sore throat. Negative for ear pain, trouble swallowing and voice change.   Eyes: Negative.   Respiratory: Positive for cough. Negative for chest tightness, shortness of breath and wheezing.   Cardiovascular: Negative.   Gastrointestinal: Negative.   Genitourinary: Negative.   Musculoskeletal: Negative for neck pain.  Neurological: Negative.  Negative for headaches.       Objective:   Physical Exam  Constitutional: He is oriented to person, place, and time. He appears well-developed and well-nourished.  HENT:   Head: Normocephalic and atraumatic.  Right Ear: Hearing, tympanic membrane and external ear normal.  Left Ear: Hearing, tympanic membrane and external ear normal.  Nose: Right sinus exhibits maxillary sinus tenderness. Left sinus exhibits maxillary sinus tenderness.  Mouth/Throat: Uvula is midline, oropharynx is clear and moist and mucous membranes are normal. No posterior oropharyngeal edema or posterior oropharyngeal erythema.  Eyes: Conjunctivae are normal. Pupils are equal, round, and reactive to light.  Neck: Normal range of motion. Neck supple.  Cardiovascular: Normal rate, regular rhythm and normal heart sounds.   Pulmonary/Chest: Effort normal and breath sounds normal. He has no wheezes.  Abdominal: Soft. Bowel sounds are normal.  Musculoskeletal: Normal range of motion.  Lymphadenopathy:    He has no cervical adenopathy.  Neurological: He is alert and oriented to person, place, and time.  Skin: Skin is warm and dry. No rash noted.      Assessment & Plan:  sinusitis Will hold the zpak and take if she is not getting better, increase fluids, rest, cont allergy pill

## 2015-05-14 NOTE — Patient Instructions (Signed)
Sinusitis can be uncomfortable. People with sinusitis have congestion with yellow/green/gray discharge, sinus pain/pressure, pain around the eyes. Sinus infections almost ALWAYS stem from a viral infection and antibiotics don't work against a virus. Even when bacteria is responsible, the infections usually clear up on their own in a week or so.   PLEASE TRY TO DO OVER THE COUNTER TREATMENT AND PREDNISONE FOR 5-7 DAYS AND IF YOU ARE NOT GETTING BETTER OR GETTING WORSE THEN YOU CAN START ON AN ANTIBIOTIC GIVEN.  Can take the prednisone AT NIGHT WITH DINNER, it take 8-12 hours to start working so it will NOT affect your sleeping if you take it at night with your food!! Take two pills the first night and 1 or two pill the second night and then 1 pill the other nights.   Risk of antibiotic use: About 1 in 4 people who take antibiotics have side effects including stomach problems, dizziness, or rashes. Those problems clear up soon after stopping the drugs, but in rare cases antibiotics can cause severe allergic reaction. Over use of antibiotics also encourages the growth of bacteria that can't be controlled easily with drugs. That makes you more vunerable to antibiotic-resistant infections and undermines the benefits of antibiotics for others.   Waste of Money: Antibiotics often aren't very expensive, but any money spent on unnecessary drugs is money down the drain.   When are antibiotics needed? Only when symptoms last longer than a week.  Start to improve but then worsen again  -It can take up to 2 weeks to feel better.   -If you do not get better in 7-10 days (Have fever, facial pain, dental pain and swelling), then please call the office and it is now appropriate to start an antibiotic.   -Please take Tylenol or Ibuprofen for pain. -Acetaminiphen 325mg orally every 4-6 hours for pain.  Max: 10 per day -Ibuprofen 200mg orally every 6-8 hours for pain.  Take with food to avoid ulcers.   Max 10 per  day  Please pick one of the over the counter allergy medications below and take it once daily for allergies.  Claritin or loratadine cheapest but likely the weakest  Zyrtec or certizine at night because it can make you sleepy The strongest is allegra or fexafinadine  Cheapest at [employer], [employer], [employer]  -While drinking fluids, pinch and hold nose close and swallow.  This will help open up your eustachian tubes to drain the fluid behind your ear drums. -Try steam showers to open your nasal passages.   Drink lots of water to stay hydrated and to thin mucous.  Flonase/Nasonex is to help the inflammation.  Take 2 sprays in each nostril at bedtime.  Make sure you spray towards the outside of each nostril towards the outer corner of your eye, hold nose close and tilt head back.  This will help the medication get into your sinuses.  If you do not like this medication, then use saline nasal sprays same directions as above for Flonase. Stop the medication right away if you get blurring of your vision or nose bleeds.  Sinusitis Sinusitis is redness, soreness, and inflammation of the paranasal sinuses. Paranasal sinuses are air pockets within the bones of your face (beneath the eyes, the middle of the forehead, or above the eyes). In healthy paranasal sinuses, mucus is able to drain out, and air is able to circulate through them by way of your nose. However, when your paranasal sinuses are inflamed, mucus and air can   become trapped. This can allow bacteria and other germs to grow and cause infection. Sinusitis can develop quickly and last only a short time (acute) or continue over a long period (chronic). Sinusitis that lasts for more than 12 weeks is considered chronic.  CAUSES  Causes of sinusitis include: Allergies. Structural abnormalities, such as displacement of the cartilage that separates your nostrils (deviated septum), which can decrease the air flow through your nose and sinuses and affect sinus  drainage. Functional abnormalities, such as when the small hairs (cilia) that line your sinuses and help remove mucus do not work properly or are not present. SIGNS AND SYMPTOMS  Symptoms of acute and chronic sinusitis are the same. The primary symptoms are pain and pressure around the affected sinuses. Other symptoms include: Upper toothache. Earache. Headache. Bad breath. Decreased sense of smell and taste. A cough, which worsens when you are lying flat. Fatigue. Fever. Thick drainage from your nose, which often is green and may contain pus (purulent). Swelling and warmth over the affected sinuses. DIAGNOSIS  Your health care provider will perform a physical exam. During the exam, your health care provider may: Look in your nose for signs of abnormal growths in your nostrils (nasal polyps).  Tap over the affected sinus to check for signs of infection. View the inside of your sinuses (endoscopy) using an imaging device that has a light attached (endoscope). If your health care provider suspects that you have chronic sinusitis, one or more of the following tests may be recommended: Allergy tests. Nasal culture. A sample of mucus is taken from your nose, sent to a lab, and screened for bacteria. Nasal cytology. A sample of mucus is taken from your nose and examined by your health care provider to determine if your sinusitis is related to an allergy. TREATMENT  Most cases of acute sinusitis are related to a viral infection and will resolve on their own within 10 days. Sometimes medicines are prescribed to help relieve symptoms (pain medicine, decongestants, nasal steroid sprays, or saline sprays).  However, for sinusitis related to a bacterial infection, your health care provider will prescribe antibiotic medicines. These are medicines that will help kill the bacteria causing the infection.  Rarely, sinusitis is caused by a fungal infection. In theses cases, your health care provider will  prescribe antifungal medicine. For some cases of chronic sinusitis, surgery is needed. Generally, these are cases in which sinusitis recurs more than 3 times per year, despite other treatments. HOME CARE INSTRUCTIONS  Drink plenty of water. Water helps thin the mucus so your sinuses can drain more easily. Use a humidifier. Inhale steam 3 to 4 times a day (for example, sit in the bathroom with the shower running). Apply a warm, moist washcloth to your face 3 to 4 times a day, or as directed by your health care provider. Use saline nasal sprays to help moisten and clean your sinuses. Take medicines only as directed by your health care provider. If you were prescribed either an antibiotic or antifungal medicine, finish it all even if you start to feel better. SEEK IMMEDIATE MEDICAL CARE IF: You have increasing pain or severe headaches. You have nausea, vomiting, or drowsiness. You have swelling around your face. You have vision problems. You have a stiff neck. You have difficulty breathing. MAKE SURE YOU:  Understand these instructions. Will watch your condition. Will get help right away if you are not doing well or get worse. Document Released: 09/13/2005 Document Revised: 01/28/2014 Document Reviewed: 09/28/2011 [employer]   Patient Information 2015 [employer]. This information is not intended to replace advice given to you by your health care provider. Make sure you discuss any questions you have with your health care provider.   

## 2015-05-30 ENCOUNTER — Encounter: Payer: Self-pay | Admitting: Internal Medicine

## 2015-07-30 ENCOUNTER — Encounter: Payer: Self-pay | Admitting: Internal Medicine

## 2015-07-30 ENCOUNTER — Ambulatory Visit (INDEPENDENT_AMBULATORY_CARE_PROVIDER_SITE_OTHER): Payer: BLUE CROSS/BLUE SHIELD | Admitting: Internal Medicine

## 2015-07-30 VITALS — BP 140/90 | HR 76 | Temp 97.5°F | Resp 16 | Ht 70.0 in | Wt 202.0 lb

## 2015-07-30 DIAGNOSIS — R6889 Other general symptoms and signs: Secondary | ICD-10-CM | POA: Diagnosis not present

## 2015-07-30 DIAGNOSIS — R7303 Prediabetes: Secondary | ICD-10-CM | POA: Diagnosis not present

## 2015-07-30 DIAGNOSIS — K219 Gastro-esophageal reflux disease without esophagitis: Secondary | ICD-10-CM | POA: Diagnosis not present

## 2015-07-30 DIAGNOSIS — Z79899 Other long term (current) drug therapy: Secondary | ICD-10-CM | POA: Diagnosis not present

## 2015-07-30 DIAGNOSIS — Z1212 Encounter for screening for malignant neoplasm of rectum: Secondary | ICD-10-CM

## 2015-07-30 DIAGNOSIS — Z125 Encounter for screening for malignant neoplasm of prostate: Secondary | ICD-10-CM

## 2015-07-30 DIAGNOSIS — Z23 Encounter for immunization: Secondary | ICD-10-CM | POA: Diagnosis not present

## 2015-07-30 DIAGNOSIS — I10 Essential (primary) hypertension: Secondary | ICD-10-CM | POA: Diagnosis not present

## 2015-07-30 DIAGNOSIS — E559 Vitamin D deficiency, unspecified: Secondary | ICD-10-CM | POA: Diagnosis not present

## 2015-07-30 DIAGNOSIS — Z0001 Encounter for general adult medical examination with abnormal findings: Secondary | ICD-10-CM

## 2015-07-30 DIAGNOSIS — R5383 Other fatigue: Secondary | ICD-10-CM

## 2015-07-30 DIAGNOSIS — Z6829 Body mass index (BMI) 29.0-29.9, adult: Secondary | ICD-10-CM

## 2015-07-30 DIAGNOSIS — Z111 Encounter for screening for respiratory tuberculosis: Secondary | ICD-10-CM

## 2015-07-30 DIAGNOSIS — E782 Mixed hyperlipidemia: Secondary | ICD-10-CM

## 2015-07-30 LAB — IRON AND TIBC
%SAT: 34 % (ref 15–60)
IRON: 112 ug/dL (ref 50–180)
TIBC: 331 ug/dL (ref 250–425)
UIBC: 219 ug/dL (ref 125–400)

## 2015-07-30 LAB — CBC WITH DIFFERENTIAL/PLATELET
BASOS PCT: 0 % (ref 0–1)
Basophils Absolute: 0 10*3/uL (ref 0.0–0.1)
Eosinophils Absolute: 0.1 10*3/uL (ref 0.0–0.7)
Eosinophils Relative: 1 % (ref 0–5)
HEMATOCRIT: 44.8 % (ref 39.0–52.0)
HEMOGLOBIN: 15.9 g/dL (ref 13.0–17.0)
LYMPHS ABS: 1.7 10*3/uL (ref 0.7–4.0)
LYMPHS PCT: 17 % (ref 12–46)
MCH: 31.7 pg (ref 26.0–34.0)
MCHC: 35.5 g/dL (ref 30.0–36.0)
MCV: 89.2 fL (ref 78.0–100.0)
MONO ABS: 0.7 10*3/uL (ref 0.1–1.0)
MONOS PCT: 7 % (ref 3–12)
MPV: 9.8 fL (ref 8.6–12.4)
NEUTROS ABS: 7.7 10*3/uL (ref 1.7–7.7)
NEUTROS PCT: 75 % (ref 43–77)
Platelets: 187 10*3/uL (ref 150–400)
RBC: 5.02 MIL/uL (ref 4.22–5.81)
RDW: 13.8 % (ref 11.5–15.5)
WBC: 10.2 10*3/uL (ref 4.0–10.5)

## 2015-07-30 LAB — HEPATIC FUNCTION PANEL
ALT: 21 U/L (ref 9–46)
AST: 27 U/L (ref 10–35)
Albumin: 4.3 g/dL (ref 3.6–5.1)
Alkaline Phosphatase: 55 U/L (ref 40–115)
BILIRUBIN DIRECT: 0.3 mg/dL — AB (ref <=0.2)
Indirect Bilirubin: 1.2 mg/dL (ref 0.2–1.2)
TOTAL PROTEIN: 6.7 g/dL (ref 6.1–8.1)
Total Bilirubin: 1.5 mg/dL — ABNORMAL HIGH (ref 0.2–1.2)

## 2015-07-30 LAB — MAGNESIUM: MAGNESIUM: 1.9 mg/dL (ref 1.5–2.5)

## 2015-07-30 LAB — BASIC METABOLIC PANEL WITH GFR
BUN: 12 mg/dL (ref 7–25)
CHLORIDE: 100 mmol/L (ref 98–110)
CO2: 23 mmol/L (ref 20–31)
CREATININE: 1.12 mg/dL (ref 0.70–1.25)
Calcium: 9.4 mg/dL (ref 8.6–10.3)
GFR, Est African American: 80 mL/min (ref >=60)
GFR, Est Non African American: 69 mL/min (ref >=60)
GLUCOSE: 78 mg/dL (ref 65–99)
Potassium: 4 mmol/L (ref 3.5–5.3)
Sodium: 137 mmol/L (ref 135–146)

## 2015-07-30 LAB — HEMOGLOBIN A1C
HEMOGLOBIN A1C: 5.2 % (ref <5.7)
MEAN PLASMA GLUCOSE: 103 mg/dL (ref <117)

## 2015-07-30 LAB — LIPID PANEL
Cholesterol: 132 mg/dL (ref 125–200)
HDL: 55 mg/dL (ref >=40)
LDL CALC: 67 mg/dL (ref <130)
TRIGLYCERIDES: 49 mg/dL (ref <150)
Total CHOL/HDL Ratio: 2.4 Ratio (ref <=5.0)
VLDL: 10 mg/dL (ref <30)

## 2015-07-30 NOTE — Patient Instructions (Signed)
Recommend Adult Low Dose Aspirin or   coated  Aspirin 81 mg daily   To reduce risk of Colon Cancer 20 %,   Skin Cancer 26 % ,   Melanoma 46%   and   Pancreatic cancer 60%   ++++++++++++++++++++++++++++++++++++++++++++++++++++++  Vitamin D goal   is between 70-100.   Please make sure that you are taking your Vitamin D as directed.   It is very important as a natural anti-inflammatory   helping hair, skin, and nails, as well as reducing stroke and heart attack risk.   It helps your bones and helps with mood.  It also decreases numerous cancer risks so please take it as directed.   Low Vit D is associated with a 200-300% higher risk for CANCER   and 200-300% higher risk for HEART   ATTACK  &  STROKE.   ......................................  It is also associated with higher death rate at younger ages,   autoimmune diseases like Rheumatoid arthritis, Lupus, Multiple Sclerosis.     Also many other serious conditions, like depression, Alzheimer's  Dementia, infertility, muscle aches, fatigue, fibromyalgia - just to name a few.  ++++++++++++++++++++++++++++++++++++++++++++++++  Recommend the book "The END of DIETING" by Dr Joel Fuhrman   & the book "The END of DIABETES " by Dr Joel Fuhrman  At [employer] - get book & Audio CD's     Being diabetic has a  300% increased risk for heart attack, stroke, cancer, and alzheimer- type vascular dementia. It is very important that you work harder with diet by avoiding all foods that are white. Avoid white rice (brown & wild rice is OK), white potatoes (sweetpotatoes in moderation is OK), White bread or wheat bread or anything made out of white flour like bagels, donuts, rolls, buns, biscuits, cakes, pastries, cookies, pizza crust, and pasta (made from white flour & egg whites) - vegetarian pasta or spinach or wheat pasta is OK. Multigrain breads like Arnold's or [employer], or multigrain sandwich thins or flatbreads.  Diet,  exercise and weight loss can reverse and cure diabetes in the early stages.  Diet, exercise and weight loss is very important in the control and prevention of complications of diabetes which affects every system in your body, ie. Brain - dementia/stroke, eyes - glaucoma/blindness, heart - heart attack/heart failure, kidneys - dialysis, stomach - gastric paralysis, intestines - malabsorption, nerves - severe painful neuritis, circulation - gangrene & loss of a leg(s), and finally cancer and Alzheimers.    I recommend avoid fried & greasy foods,  sweets/candy, white rice (brown or wild rice or Quinoa is OK), white potatoes (sweet potatoes are OK) - anything made from white flour - bagels, doughnuts, rolls, buns, biscuits,white and wheat breads, pizza crust and traditional pasta made of white flour & egg white(vegetarian pasta or spinach or wheat pasta is OK).  Multi-grain bread is OK - like multi-grain flat bread or sandwich thins. Avoid alcohol in excess. Exercise is also important.    Eat all the vegetables you want - avoid meat, especially red meat and dairy - especially cheese.  Cheese is the most concentrated form of trans-fats which is the worst thing to clog up our arteries. Veggie cheese is OK which can be found in the fresh produce section at [employer] or [employer] or [employer]  ++++++++++++++++++++++++++++++++++++++++++++++++++ DASH Eating Plan  DASH stands for "Dietary Approaches to Stop Hypertension."   The DASH eating plan is a healthy eating plan that has been shown to reduce high   blood pressure (hypertension). Additional health benefits may include reducing the risk of type 2 diabetes mellitus, heart disease, and stroke. The DASH eating plan may also help with weight loss.  WHAT DO I NEED TO KNOW ABOUT THE DASH EATING PLAN? For the DASH eating plan, you will follow these general guidelines:  Choose foods with a percent daily value for sodium of less than 5% (as listed on the food  label).  Use salt-free seasonings or herbs instead of table salt or sea salt.  Check with your health care provider or pharmacist before using salt substitutes.  Eat lower-sodium products, often labeled as "lower sodium" or "no salt added."  Eat fresh foods.  Eat more vegetables, fruits, and low-fat dairy products.    Choose whole grains. Look for the word "whole" as the first word in the ingredient list.  Choose fish   Limit sweets, desserts, sugars, and sugary drinks.  Choose heart-healthy fats.  Eat veggie cheese   Eat more home-cooked food and less restaurant, buffet, and fast food.  Limit fried foods.  Cook foods using methods other than frying.  Limit canned vegetables. If you do use them, rinse them well to decrease the sodium.  When eating at a restaurant, ask that your food be prepared with less salt, or no salt if possible.                      WHAT FOODS CAN I EAT?  Seek help from a dietitian for individual calorie needs. Grains Whole grain or whole wheat bread. Brown rice. Whole grain or whole wheat pasta. Quinoa, bulgur, and whole grain cereals. Low-sodium cereals. Corn or whole wheat flour tortillas. Whole grain cornbread. Whole grain crackers. Low-sodium crackers.  Vegetables Fresh or frozen vegetables (raw, steamed, roasted, or grilled). Low-sodium or reduced-sodium tomato and vegetable juices. Low-sodium or reduced-sodium tomato sauce and paste. Low-sodium or reduced-sodium canned vegetables.   Fruits All fresh, canned (in natural juice), or frozen fruits.  Meat and Other Protein Products  All fish and seafood.  Dried beans, peas, or lentils. Unsalted nuts and seeds. Unsalted canned beans. Dairy Low-fat dairy products, such as skim or 1% milk, 2% or reduced-fat cheeses, low-fat ricotta or cottage cheese, or plain low-fat yogurt. Low-sodium or reduced-sodium cheeses.  Fats and Oils Tub margarines without trans fats. Light or reduced-fat mayonnaise  and salad dressings (reduced sodium). Avocado. Safflower, olive, or canola oils. Natural peanut or almond butter.  Other Unsalted popcorn and pretzels. The items listed above may not be a complete list of recommended foods or beverages. Contact your dietitian for more options.  +++++++++++++++++++++++++++++++++++++++++++  WHAT FOODS ARE NOT RECOMMENDED?  Grains/ White flour or wheat flour  White bread. White pasta. White rice. Refined cornbread. Bagels and croissants. Crackers that contain trans fat.  Vegetables  Creamed or fried vegetables. Vegetables in a . Regular canned vegetables. Regular canned tomato sauce and paste. Regular tomato and vegetable juices.  Fruits Dried fruits. Canned fruit in light or heavy syrup. Fruit juice.  Meat and Other Protein Products Meat in general. Fatty cuts of meat. Ribs, chicken wings, bacon, sausage, bologna, salami, chitterlings, fatback, hot dogs, bratwurst, and packaged luncheon meats. Salted nuts and seeds. Canned beans with salt.  Dairy Whole or 2% milk, cream, half-and-half, and cream cheese. Whole-fat or sweetened yogurt. Full-fat cheeses or blue cheese. Nondairy creamers and whipped toppings. Processed cheese, cheese spreads, or cheese curds.  Condiments Onion and garlic salt, seasoned salt, table salt, and sea   salt. Canned and packaged gravies. Worcestershire sauce. Tartar sauce. Barbecue sauce. Teriyaki sauce. Soy sauce, including reduced sodium. Steak sauce. Fish sauce. Oyster sauce. Cocktail sauce. Horseradish. Ketchup and mustard. Meat flavorings and tenderizers. Bouillon cubes. Hot sauce. Tabasco sauce. Marinades. Taco seasonings. Relishes.  Fats and Oils Butter, stick margarine, lard, shortening, ghee, and bacon fat. Coconut, palm kernel, or palm oils. Regular salad dressings.  Pickles and olives. Salted popcorn and pretzels. The items listed above may not be a complete list of foods and beverages to avoid.   Preventive Care for  Adults  A healthy lifestyle and preventive care can promote health and wellness. Preventive health guidelines for men include the following key practices:  A routine yearly physical is a good way to check with your health care provider about your health and preventative screening. It is a chance to share any concerns and updates on your health and to receive a thorough exam.  Visit your dentist for a routine exam and preventative care every 6 months. Brush your teeth twice a day and floss once a day. Good oral hygiene prevents tooth decay and gum disease.  The frequency of eye exams is based on your age, health, family medical history, use of contact lenses, and other factors. Follow your health care provider's recommendations for frequency of eye exams.  Eat a healthy diet. Foods such as vegetables, fruits, whole grains, low-fat dairy products, and lean protein foods contain the nutrients you need without too many calories. Decrease your intake of foods high in solid fats, added sugars, and salt. Eat the right amount of calories for you.Get information about a proper diet from your health care provider, if necessary.  Regular physical exercise is one of the most important things you can do for your health. Most adults should get at least 150 minutes of moderate-intensity exercise (any activity that increases your heart rate and causes you to sweat) each week. In addition, most adults need muscle-strengthening exercises on 2 or more days a week.  Maintain a healthy weight. The body mass index (BMI) is a screening tool to identify possible weight problems. It provides an estimate of body fat based on height and weight. Your health care provider can find your BMI and can help you achieve or maintain a healthy weight.For adults 20 years and older:  A BMI below 18.5 is considered underweight.  A BMI of 18.5 to 24.9 is normal.  A BMI of 25 to 29.9 is considered overweight.  A BMI of 30 and above  is considered obese.  Maintain normal blood lipids and cholesterol levels by exercising and minimizing your intake of saturated fat. Eat a balanced diet with plenty of fruit and vegetables. Blood tests for lipids and cholesterol should begin at age 20 and be repeated every 5 years. If your lipid or cholesterol levels are high, you are over 50, or you are at high risk for heart disease, you may need your cholesterol levels checked more frequently.Ongoing high lipid and cholesterol levels should be treated with medicines if diet and exercise are not working.  If you smoke, find out from your health care provider how to quit. If you do not use tobacco, do not start.  Lung cancer screening is recommended for adults aged 55-80 years who are at high risk for developing lung cancer because of a history of smoking. A yearly low-dose CT scan of the lungs is recommended for people who have at least a 30-pack-year history of smoking and are a   current smoker or have quit within the past 15 years. A pack year of smoking is smoking an average of 1 pack of cigarettes a day for 1 year (for example: 1 pack a day for 30 years or 2 packs a day for 15 years). Yearly screening should continue until the smoker has stopped smoking for at least 15 years. Yearly screening should be stopped for people who develop a health problem that would prevent them from having lung cancer treatment.  If you choose to drink alcohol, do not have more than 2 drinks per day. One drink is considered to be 12 ounces (355 mL) of beer, 5 ounces (148 mL) of wine, or 1.5 ounces (44 mL) of liquor.  Avoid use of street drugs. Do not share needles with anyone. Ask for help if you need support or instructions about stopping the use of drugs.  High blood pressure causes heart disease and increases the risk of stroke. Your blood pressure should be checked at least every 1-2 years. Ongoing high blood pressure should be treated with medicines, if weight loss  and exercise are not effective.  If you are 45-79 years old, ask your health care provider if you should take aspirin to prevent heart disease.  Diabetes screening involves taking a blood sample to check your fasting blood sugar level. This should be done once every 3 years, after age 45, if you are within normal weight and without risk factors for diabetes. Testing should be considered at a younger age or be carried out more frequently if you are overweight and have at least 1 risk factor for diabetes.  Colorectal cancer can be detected and often prevented. Most routine colorectal cancer screening begins at the age of 50 and continues through age 75. However, your health care provider may recommend screening at an earlier age if you have risk factors for colon cancer. On a yearly basis, your health care provider may provide home test kits to check for hidden blood in the stool. Use of a small camera at the end of a tube to directly examine the colon (sigmoidoscopy or colonoscopy) can detect the earliest forms of colorectal cancer. Talk to your health care provider about this at age 50, when routine screening begins. Direct exam of the colon should be repeated every 5-10 years through age 75, unless early forms of precancerous polyps or small growths are found.   Talk with your health care provider about prostate cancer screening.  Testicular cancer screening isrecommended for adult males. Screening includes self-exam, a health care provider exam, and other screening tests. Consult with your health care provider about any symptoms you have or any concerns you have about testicular cancer.  Use sunscreen. Apply sunscreen liberally and repeatedly throughout the day. You should seek shade when your shadow is shorter than you. Protect yourself by wearing long sleeves, pants, a wide-brimmed hat, and sunglasses year round, whenever you are outdoors.  Once a month, do a whole-body skin exam, using a mirror to  look at the skin on your back. Tell your health care provider about new moles, moles that have irregular borders, moles that are larger than a pencil eraser, or moles that have changed in shape or color.  Stay current with required vaccines (immunizations).  Influenza vaccine. All adults should be immunized every year.  Tetanus, diphtheria, and acellular pertussis (Td, Tdap) vaccine. An adult who has not previously received Tdap or who does not know his vaccine status should receive 1 dose of   Tdap. This initial dose should be followed by tetanus and diphtheria toxoids (Td) booster doses every 10 years. Adults with an unknown or incomplete history of completing a 3-dose immunization series with Td-containing vaccines should begin or complete a primary immunization series including a Tdap dose. Adults should receive a Td booster every 10 years.  Varicella vaccine. An adult without evidence of immunity to varicella should receive 2 doses or a second dose if he has previously received 1 dose.  Human papillomavirus (HPV) vaccine. Males aged 13-21 years who have not received the vaccine previously should receive the 3-dose series. Males aged 22-26 years may be immunized. Immunization is recommended through the age of 26 years for any male who has sex with males and did not get any or all doses earlier. Immunization is recommended for any person with an immunocompromised condition through the age of 26 years if he did not get any or all doses earlier. During the 3-dose series, the second dose should be obtained 4-8 weeks after the first dose. The third dose should be obtained 24 weeks after the first dose and 16 weeks after the second dose.  Zoster vaccine. One dose is recommended for adults aged 60 years or older unless certain conditions are present.    PREVNAR  - Pneumococcal 13-valent conjugate (PCV13) vaccine. When indicated, a person who is uncertain of his immunization history and has no record of  immunization should receive the PCV13 vaccine. An adult aged 19 years or older who has certain medical conditions and has not been previously immunized should receive 1 dose of PCV13 vaccine. This PCV13 should be followed with a dose of pneumococcal polysaccharide (PPSV23) vaccine. The PPSV23 vaccine dose should be obtained at least 8 weeks after the dose of PCV13 vaccine. An adult aged 19 years or older who has certain medical conditions and previously received 1 or more doses of PPSV23 vaccine should receive 1 dose of PCV13. The PCV13 vaccine dose should be obtained 1 or more years after the last PPSV23 vaccine dose.    PNEUMOVAX - Pneumococcal polysaccharide (PPSV23) vaccine. When PCV13 is also indicated, PCV13 should be obtained first. All adults aged 65 years and older should be immunized. An adult younger than age 65 years who has certain medical conditions should be immunized. Any person who resides in a nursing home or long-term care facility should be immunized. An adult smoker should be immunized. People with an immunocompromised condition and certain other conditions should receive both PCV13 and PPSV23 vaccines. People with human immunodeficiency virus (HIV) infection should be immunized as soon as possible after diagnosis. Immunization during chemotherapy or radiation therapy should be avoided. Routine use of PPSV23 vaccine is not recommended for American Indians, [place] Natives, or people younger than 65 years unless there are medical conditions that require PPSV23 vaccine. When indicated, people who have unknown immunization and have no record of immunization should receive PPSV23 vaccine. One-time revaccination 5 years after the first dose of PPSV23 is recommended for people aged 19-64 years who have chronic kidney failure, nephrotic syndrome, asplenia, or immunocompromised conditions. People who received 1-2 doses of PPSV23 before age 65 years should receive another dose of PPSV23 vaccine at age  65 years or later if at least 5 years have passed since the previous dose. Doses of PPSV23 are not needed for people immunized with PPSV23 at or after age 65 years.    Hepatitis A vaccine. Adults who wish to be protected from this disease, have certain high-risk conditions,   work with hepatitis A-infected animals, work in hepatitis A research labs, or travel to or work in countries with a high rate of hepatitis A should be immunized. Adults who were previously unvaccinated and who anticipate close contact with an international adoptee during the first 60 days after arrival in the [place] from a country with a high rate of hepatitis A should be immunized.    Hepatitis B vaccine. Adults should be immunized if they wish to be protected from this disease, have certain high-risk conditions, may be exposed to blood or other infectious body fluids, are household contacts or sex partners of hepatitis B positive people, are clients or workers in certain care facilities, or travel to or work in countries with a high rate of hepatitis B.   Preventive Service / Frequency   Ages 40 to 64  Blood pressure check.  Lipid and cholesterol check  Lung cancer screening. / Every year if you are aged 55-80 years and have a 30-pack-year history of smoking and currently smoke or have quit within the past 15 years. Yearly screening is stopped once you have quit smoking for at least 15 years or develop a health problem that would prevent you from having lung cancer treatment.  Fecal occult blood test (FOBT) of stool. / Every year beginning at age 50 and continuing until age 75. You may not have to do this test if you get a colonoscopy every 10 years.  Flexible sigmoidoscopy** or colonoscopy.** / Every 5 years for a flexible sigmoidoscopy or every 10 years for a colonoscopy beginning at age 50 and continuing until age 75. Screening for abdominal aortic aneurysm (AAA)  by ultrasound is recommended for people who  have history of high blood pressure or who are current or former smokers. 

## 2015-07-31 LAB — VITAMIN D 25 HYDROXY (VIT D DEFICIENCY, FRACTURES): Vit D, 25-Hydroxy: 92 ng/mL (ref 30–100)

## 2015-07-31 LAB — URINALYSIS, ROUTINE W REFLEX MICROSCOPIC
BILIRUBIN URINE: NEGATIVE
GLUCOSE, UA: NEGATIVE
Hgb urine dipstick: NEGATIVE
LEUKOCYTES UA: NEGATIVE
Nitrite: NEGATIVE
PH: 5.5 (ref 5.0–8.0)
Protein, ur: NEGATIVE
SPECIFIC GRAVITY, URINE: 1.006 (ref 1.001–1.035)

## 2015-07-31 LAB — MICROALBUMIN / CREATININE URINE RATIO
Creatinine, Urine: 22 mg/dL (ref 20–370)
Microalb, Ur: <0.2 mg/dL

## 2015-07-31 LAB — PSA: PSA: 0.67 ng/mL (ref <=4.00)

## 2015-07-31 LAB — TSH: TSH: 0.656 u[IU]/mL (ref 0.350–4.500)

## 2015-07-31 LAB — TESTOSTERONE: TESTOSTERONE: 378 ng/dL (ref 300–890)

## 2015-07-31 LAB — INSULIN, RANDOM: INSULIN: 3.3 u[IU]/mL (ref 2.0–19.6)

## 2015-07-31 LAB — VITAMIN B12: VITAMIN B 12: 784 pg/mL (ref 211–911)

## 2015-08-01 ENCOUNTER — Encounter: Payer: Self-pay | Admitting: Internal Medicine

## 2015-08-01 ENCOUNTER — Other Ambulatory Visit: Payer: Self-pay | Admitting: Internal Medicine

## 2015-08-01 DIAGNOSIS — R7611 Nonspecific reaction to tuberculin skin test without active tuberculosis: Secondary | ICD-10-CM

## 2015-08-02 ENCOUNTER — Encounter: Payer: Self-pay | Admitting: Internal Medicine

## 2015-08-02 NOTE — Progress Notes (Signed)
Patient ID: Alex Lewis, male   DOB: 1951-09-10, 65 y.o.   MRN: [id number]  Annual  Screening/Preventative Comprehensive Examination      This very nice 64 y.o. MWM presents for presents for a Wellness/Preventative Visit & comprehensive evaluation and management of multiple medical co-morbidities.  Patient has been followed for HTN,  PreDiabetes, Hyperlipidemia  and Vitamin D Deficiency. Patient has lon hx/o Morbid Obesity and with a revelation he has has improved diet and dramatic weight loss and improved health status with weight loss.       HTN predates since 2001. Patient's BP has been controlled off meds since weight loss. Today's BP: 140/90 mmHg. Patient denies any cardiac symptoms as chest pain, palpitations, shortness of breath, dizziness or ankle swelling. Patient also has hx/o GERD which likewise has resolved with better eating choices.       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 Patient has lost 40 # over the last 2 years & 50 # over the last 3 years with improved diet and patient denies reactive hypoglycemic symptoms, visual blurring, diabetic polys or paresthesias. Today's  A1c is back to normal at 5.2%.         Patient's hyperlipidemia is controlled with diet since weight loss. Today's lipids are at goal with Cholesterol 132; HDL 55; LDL 67; &  Triglycerides 49.          Finally, patient has history of Vitamin D Deficiency of "16" in 2008  and  today's vitamin D is 92.   Medication Sig  . VITAMIN D5000 UNITS TABS Take 10,000 Units by mouth daily.  Marland Kitchen loratadine 10 MG tablet Take 10 mg by mouth daily.   Allergies  Allergen Reactions  . Penicillins     REACTION: unknown   Past Medical History  Diagnosis Date  . Hypertension   . Hyperlipidemia   . Pre-diabetes   . Morbid obesity (HCC)   . Hypogonadism, male   . Vitamin D deficiency   . GERD (gastroesophageal reflux disease)   . Allergic rhinitis   . BPH  (benign prostatic hyperplasia)   . Kidney stones    Health Maintenance  Topic Date Due  . Hepatitis C Screening  1951/05/15  . INFLUENZA VACCINE  04/27/2016  . COLONOSCOPY  02/15/2018  . TETANUS/TDAP  09/06/2024  . ZOSTAVAX  Completed  . HIV Screening  Completed   Immunization History  Administered Date(s) Administered  . DTaP 01/26/2000  . Influenza Whole 06/22/2013  . Influenza, High Dose Seasonal PF 06/27/2015  . Influenza-Unspecified 07/13/2014  . PPD Test 05/29/2014, 07/30/2015  . Tdap 09/06/2014  . Zoster 04/22/2013   Past Surgical History  Procedure Laterality Date  . Knee surgery Right 1977  . Flexible sigmoidoscopy  1992  . Tonsillectomy and adenoidectomy  1960   Family History  Problem Relation Age of Onset  . Cancer Father     lung  . Diabetes Father   . Diabetes Brother     Social History   Social History  . Marital Status: Married    Spouse Name: N/A  . Number of Children: N/A  . Years of Education: N/A   Occupational History  . 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   Social History Main Topics  . Smoking status: Former Smoker    Quit date: 09/28/1975  . Smokeless tobacco: Not on file  . Alcohol Use: Yes     Comment:  occasional  . Drug Use: No  . Sexual Activity: Active    ROS Constitutional: Denies fever, chills, weight loss/gain, headaches, insomnia,  night sweats or change in appetite. Does c/o fatigue. Eyes: Denies redness, blurred vision, diplopia, discharge, itchy or watery eyes.  ENT: Denies discharge, congestion, post nasal drip, epistaxis, sore throat, earache, hearing loss, dental pain, Tinnitus, Vertigo, Sinus pain or snoring.  Cardio: Denies chest pain, palpitations, irregular heartbeat, syncope, dyspnea, diaphoresis, orthopnea, PND, claudication or edema Respiratory: denies cough, dyspnea, DOE, pleurisy, hoarseness, laryngitis or wheezing.  Gastrointestinal: Denies dysphagia, heartburn, reflux, water brash,  pain, cramps, nausea, vomiting, bloating, diarrhea, constipation, hematemesis, melena, hematochezia, jaundice or hemorrhoids Genitourinary: Denies dysuria, frequency, urgency, nocturia, hesitancy, discharge, hematuria or flank pain Musculoskeletal: Denies arthralgia, myalgia, stiffness, Jt. Swelling, pain, limp or strain/sprain. Denies Falls. Skin: Denies puritis, rash, hives, warts, acne, eczema or change in skin lesion Neuro: No weakness, tremor, incoordination, spasms, paresthesia or pain Psychiatric: Denies confusion, memory loss or sensory loss. Denies Depression. Endocrine: Denies change in weight, skin, hair change, nocturia, and paresthesia, diabetic polys, visual blurring or hyper / hypo glycemic episodes.  Heme/Lymph: No excessive bleeding, bruising or enlarged lymph nodes.  Physical Exam  BP 140/90 mmHg  Pulse 76  Temp(Src) 97.5 F (36.4 C)  Resp 16  Ht 5\' 10"  (1.778 m)  Wt 202 lb (91.627 kg)  BMI 28.98 kg/m2  General Appearance: Well nourished, in no apparent distress. Eyes: PERRLA, EOMs, conjunctiva no swelling or erythema, normal fundi and vessels. Sinuses: No frontal/maxillary tenderness ENT/Mouth: EACs patent / TMs  nl. Nares clear without erythema, swelling, mucoid exudates. Oral hygiene is good. No erythema, swelling, or exudate. Tongue normal, non-obstructing. Tonsils not swollen or erythematous. Hearing normal.  Neck: Supple, thyroid normal. No bruits, nodes or JVD. Respiratory: Respiratory effort normal.  BS equal and clear bilateral without rales, rhonci, wheezing or stridor. Cardio: Heart sounds are normal with regular rate and rhythm and no murmurs, rubs or gallops. Peripheral pulses are normal and equal bilaterally without edema. No aortic or femoral bruits. Chest: symmetric with normal excursions and percussion.  Abdomen: Flat, soft, with bowl sounds. Nontender, no guarding, rebound, hernias, masses, or organomegaly.  Lymphatics: Non tender without  lymphadenopathy.  Genitourinary: No hernias.Testes nl. DRE - prostate nl for age - smooth & firm w/o nodules. Musculoskeletal: Full ROM all peripheral extremities, joint stability, 5/5 strength, and normal gait. Skin: Warm and dry without rashes, lesions, cyanosis, clubbing or  ecchymosis.  Neuro: Cranial nerves intact, reflexes equal bilaterally. Normal muscle tone, no cerebellar symptoms. Sensation intact.  Pysch: Awake and oriented X 3 with normal affect, insight and judgment appropriate.   Assessment and Plan  1. Encounter for general adult medical examination with abnormal findings  - Microalbumin / creatinine urine ratio - EKG 12-Lead - US, RETROPERITNL ABD,  LTD - POC Hemoccult Bld/Stl  - Vitamin B12 - PSA - Testosterone - Iron and TIBC - Urinalysis, Routine w reflex microscopic  - CBC with Differential/Platelet - BASIC METABOLIC PANEL WITH GFR - Hepatic function panel - Magnesium - Lipid panel - TSH - Hemoglobin A1c - Insulin, random - Vit D  25 hydroxy  - PPD  2. Essential hypertension  - Microalbumin / creatinine urine ratio - EKG 12-Lead - US, RETROPERITNL ABD,  LTD - TSH  3. Hyperlipidemia  - Lipid panel  4. Prediabetes  - Hemoglobin A1c - Insulin, random  5. Vitamin D deficiency  - Vit D  25 hydroxy   6. Gastroesophageal reflux disease  7. Morbid obesity (HCC)   8. Screening for rectal cancer  - POC Hemoccult Bld/Stl  9. Screening for prostate cancer  - PSA  10. BMI 29.0-29.9,adult   11. Other fatigue  - Vitamin B12 - Testosterone - Iron and TIBC - TSH  12. Screening examination for pulmonary tuberculosis  - PPD  13. Medication management  - Urinalysis, Routine w reflex microscopic  - CBC with Differential/Platelet - BASIC METABOLIC PANEL WITH GFR - Hepatic function panel - Magnesium   Continue prudent diet as discussed, weight control, BP monitoring, regular exercise, and medications as discussed.  Discussed med  effects and SE's. Routine screening labs and tests as requested with regular follow-up as recommended.

## 2015-11-06 ENCOUNTER — Ambulatory Visit: Payer: Self-pay | Admitting: Internal Medicine

## 2015-12-19 ENCOUNTER — Encounter: Payer: Self-pay | Admitting: Internal Medicine

## 2015-12-19 ENCOUNTER — Ambulatory Visit (INDEPENDENT_AMBULATORY_CARE_PROVIDER_SITE_OTHER): Payer: BLUE CROSS/BLUE SHIELD | Admitting: Internal Medicine

## 2015-12-19 VITALS — BP 126/78 | HR 72 | Temp 98.0°F | Resp 16 | Ht 70.0 in | Wt 225.0 lb

## 2015-12-19 DIAGNOSIS — E782 Mixed hyperlipidemia: Secondary | ICD-10-CM

## 2015-12-19 DIAGNOSIS — R7303 Prediabetes: Secondary | ICD-10-CM

## 2015-12-19 DIAGNOSIS — J3089 Other allergic rhinitis: Secondary | ICD-10-CM | POA: Diagnosis not present

## 2015-12-19 DIAGNOSIS — E559 Vitamin D deficiency, unspecified: Secondary | ICD-10-CM

## 2015-12-19 DIAGNOSIS — Z79899 Other long term (current) drug therapy: Secondary | ICD-10-CM

## 2015-12-19 DIAGNOSIS — I10 Essential (primary) hypertension: Secondary | ICD-10-CM

## 2015-12-19 LAB — CBC WITH DIFFERENTIAL/PLATELET
BASOS PCT: 0 % (ref 0–1)
Basophils Absolute: 0 10*3/uL (ref 0.0–0.1)
EOS ABS: 0.2 10*3/uL (ref 0.0–0.7)
Eosinophils Relative: 4 % (ref 0–5)
HCT: 46 % (ref 39.0–52.0)
Hemoglobin: 16.9 g/dL (ref 13.0–17.0)
Lymphocytes Relative: 36 % (ref 12–46)
Lymphs Abs: 1.8 10*3/uL (ref 0.7–4.0)
MCH: 33.1 pg (ref 26.0–34.0)
MCHC: 36.7 g/dL — AB (ref 30.0–36.0)
MCV: 90.2 fL (ref 78.0–100.0)
MONOS PCT: 11 % (ref 3–12)
MPV: 9.5 fL (ref 8.6–12.4)
Monocytes Absolute: 0.6 10*3/uL (ref 0.1–1.0)
NEUTROS PCT: 49 % (ref 43–77)
Neutro Abs: 2.5 10*3/uL (ref 1.7–7.7)
PLATELETS: 182 10*3/uL (ref 150–400)
RBC: 5.1 MIL/uL (ref 4.22–5.81)
RDW: 13.1 % (ref 11.5–15.5)
WBC: 5.1 10*3/uL (ref 4.0–10.5)

## 2015-12-19 LAB — HEMOGLOBIN A1C
Hgb A1c MFr Bld: 5.7 % — ABNORMAL HIGH (ref <5.7)
Mean Plasma Glucose: 117 mg/dL — ABNORMAL HIGH (ref <117)

## 2015-12-19 LAB — TSH: TSH: 1.38 mIU/L (ref 0.40–4.50)

## 2015-12-19 MED ORDER — FLUTICASONE PROPIONATE 50 MCG/ACT NA SUSP: 2.0000 | Freq: Every day | NASAL | Status: DC

## 2015-12-19 MED ORDER — DEXAMETHASONE SODIUM PHOSPHATE 100 MG/10ML IJ SOLN
10.0000 mg | Freq: Once | INTRAMUSCULAR | Status: AC
  Administered 2015-12-19: 10 mg via INTRAMUSCULAR

## 2015-12-19 NOTE — Progress Notes (Signed)
Assessment and Plan:  Hypertension:  -Continue medication,  -monitor blood pressure at home.  -Continue DASH diet.   -Reminder to go to the ER if any CP, SOB, nausea, dizziness, severe HA, changes vision/speech, left arm numbness and tingling, and jaw pain.  Cholesterol: -Continue diet and exercise.  -Check cholesterol.   Pre-diabetes: -Continue diet and exercise.  -Check A1C  Vitamin D Def: -check level -continue medications.   Allergic rhinitis -cont antihistamine -flonase -decadron shot  Continue diet and meds as discussed. Further disposition pending results of labs.  HPI 65 y.o. male  presents for 3 month follow up with hypertension, hyperlipidemia, prediabetes and vitamin D.   His blood pressure has been controlled at home, today their BP is BP: 126/78 mmHg.   He does workout. He denies chest pain, shortness of breath, dizziness.   He is on cholesterol medication and denies myalgias. His cholesterol is at goal. The cholesterol last visit was:   Lab Results  Component Value Date   CHOL 132 07/30/2015   HDL 55 07/30/2015   LDLCALC 67 07/30/2015   TRIG 49 07/30/2015   CHOLHDL 2.4 07/30/2015     He has been working on diet and exercise for prediabetes, and denies foot ulcerations, hyperglycemia, hypoglycemia , increased appetite, nausea, paresthesia of the feet, polydipsia, polyuria, visual disturbances, vomiting and weight loss. Last A1C in the office was:  Lab Results  Component Value Date   HGBA1C 5.2 07/30/2015    Patient is on Vitamin D supplement.  Lab Results  Component Value Date   VD25OH 592 07/30/2015     He reports that he has been struggling with allergies lately and has been having a lot of congestion and also been feeling run.    He reports that he is going on vacation in April.  [place]    Current Medications:  Current Outpatient Prescriptions on File Prior to Visit  Medication Sig Dispense Refill  .  Cholecalciferol (VITAMIN D-3) 5000 UNITS TABS Take 10,000 Units by mouth daily.    Marland Kitchen. loratadine (CLARITIN) 10 MG tablet Take 10 mg by mouth daily.     No current facility-administered medications on file prior to visit.    Medical History:  Past Medical History  Diagnosis Date  . Hypertension   . Hyperlipidemia   . Pre-diabetes   . Morbid obesity (HCC)   . Hypogonadism, male   . Vitamin D deficiency   . GERD (gastroesophageal reflux disease)   . Allergic rhinitis   . BPH (benign prostatic hyperplasia)   . Kidney stones     Allergies:  Allergies  Allergen Reactions  . Penicillins     REACTION: unknown     Review of Systems:  Review of Systems  Constitutional: Negative for fever, chills and malaise/fatigue.  HENT: Positive for congestion. Negative for ear pain and sore throat.   Eyes: Negative.   Respiratory: Positive for cough. Negative for shortness of breath and wheezing.   Cardiovascular: Negative for chest pain, palpitations and leg swelling.  Gastrointestinal: Positive for heartburn. Negative for abdominal pain, diarrhea, constipation, blood in stool and melena.  Genitourinary: Negative.   Skin: Negative.   Neurological: Negative for dizziness, loss of consciousness and headaches.  Psychiatric/Behavioral: Negative for depression. The patient is not nervous/anxious and does not have insomnia.     Family history- Review and unchanged  Social history- Review and unchanged  Physical Exam: BP 126/78 mmHg  Pulse 72  Temp(Src) 98 F (  36.7 C) (Temporal)  Resp 16  Ht  (1.778 m)  Wt 225 lb (102.059 kg)  BMI 32.28 kg/m2 Wt Readings from Last 3 Encounters:  12/19/15 225 lb (102.059 kg)  07/30/15 202 lb (91.627 kg)  05/14/15 191 lb 9.6 oz (86.909 kg)    General Appearance: Well nourished well developed, in no apparent distress. Eyes: PERRLA, EOMs, conjunctiva no swelling or erythema ENT/Mouth: Ear canals normal without obstruction, swelling, erythma,  discharge.  TMs normal bilaterally.  Oropharynx moist, clear, without exudate, or postoropharyngeal swelling. Neck: Supple, thyroid normal,no cervical adenopathy  Respiratory: Respiratory effort normal, Breath sounds clear A&P without rhonchi, wheeze, or rale.  No retractions, no accessory usage. Cardio: RRR with no MRGs. Brisk peripheral pulses without edema.  Abdomen: Soft, + BS,  Non tender, no guarding, rebound, hernias, masses. Musculoskeletal: Full ROM, 5/5 strength, Normal gait Skin: Warm, dry without rashes, lesions, ecchymosis.  Neuro: Awake and oriented X 3, Cranial nerves intact. Normal muscle tone, no cerebellar symptoms. Psych: Normal affect, Insight and Judgment appropriate.    Terri Piedra, PA-C 9:27 AM [facility]

## 2015-12-19 NOTE — Patient Instructions (Signed)
Food Choices for Gastroesophageal Reflux Disease, Adult When you have gastroesophageal reflux disease (GERD), the foods you eat and your eating habits are very important. Choosing the right foods can help ease the discomfort of GERD. WHAT GENERAL GUIDELINES DO I NEED TO FOLLOW?  Choose fruits, vegetables, whole grains, low-fat dairy products, and low-fat meat, fish, and poultry.  Limit fats such as oils, salad dressings, butter, nuts, and avocado.  Keep a food diary to identify foods that cause symptoms.  Avoid foods that cause reflux. These may be different for different people.  Eat frequent small meals instead of three large meals each day.  Eat your meals slowly, in a relaxed setting.  Limit fried foods.  Cook foods using methods other than frying.  Avoid drinking alcohol.  Avoid drinking large amounts of liquids with your meals.  Avoid bending over or lying down until 2-3 hours after eating. WHAT FOODS ARE NOT RECOMMENDED? The following are some foods and drinks that may worsen your symptoms: Vegetables Tomatoes. Tomato juice. Tomato and spaghetti sauce. Chili peppers. Onion and garlic. Horseradish. Fruits Oranges, grapefruit, and lemon (fruit and juice). Meats High-fat meats, fish, and poultry. This includes hot dogs, ribs, ham, sausage, salami, and bacon. Dairy Whole milk and chocolate milk. Sour cream. Cream. Butter. Ice cream. Cream cheese.  Beverages Coffee and tea, with or without caffeine. Carbonated beverages or energy drinks. Condiments Hot sauce. Barbecue sauce.  Sweets/Desserts Chocolate and cocoa. Donuts. Peppermint and spearmint. Fats and Oils High-fat foods, including French fries and potato chips. Other Vinegar. Strong spices, such as black pepper, white pepper, red pepper, cayenne, curry powder, cloves, ginger, and chili powder. The items listed above may not be a complete list of foods and beverages to avoid. Contact your dietitian for more  information.   This information is not intended to replace advice given to you by your health care provider. Make sure you discuss any questions you have with your health care provider.   Document Released: 09/13/2005 Document Revised: 10/04/2014 Document Reviewed: 07/18/2013 Elsevier Interactive Patient Education 2016 [employer] Inc[employer]  

## 2015-12-20 LAB — BASIC METABOLIC PANEL WITH GFR
BUN: 16 mg/dL (ref 7–25)
CALCIUM: 9.3 mg/dL (ref 8.6–10.3)
CHLORIDE: 102 mmol/L (ref 98–110)
CO2: 24 mmol/L (ref 20–31)
CREATININE: 1.15 mg/dL (ref 0.70–1.25)
GFR, Est African American: 77 mL/min (ref >=60)
GFR, Est Non African American: 67 mL/min (ref >=60)
Glucose, Bld: 108 mg/dL — ABNORMAL HIGH (ref 65–99)
Potassium: 4.2 mmol/L (ref 3.5–5.3)
SODIUM: 143 mmol/L (ref 135–146)

## 2015-12-20 LAB — LIPID PANEL
CHOL/HDL RATIO: 3 ratio (ref <=5.0)
CHOLESTEROL: 136 mg/dL (ref 125–200)
HDL: 46 mg/dL (ref >=40)
LDL Cholesterol: 79 mg/dL (ref <130)
TRIGLYCERIDES: 56 mg/dL (ref <150)
VLDL: 11 mg/dL (ref <30)

## 2015-12-20 LAB — HEPATIC FUNCTION PANEL
ALT: 16 U/L (ref 9–46)
AST: 19 U/L (ref 10–35)
Albumin: 4.3 g/dL (ref 3.6–5.1)
Alkaline Phosphatase: 49 U/L (ref 40–115)
BILIRUBIN DIRECT: 0.2 mg/dL (ref <=0.2)
BILIRUBIN TOTAL: 0.8 mg/dL (ref 0.2–1.2)
Indirect Bilirubin: 0.6 mg/dL (ref 0.2–1.2)
Total Protein: 6.7 g/dL (ref 6.1–8.1)

## 2016-02-03 ENCOUNTER — Ambulatory Visit: Payer: Self-pay | Admitting: Internal Medicine

## 2016-03-31 ENCOUNTER — Ambulatory Visit: Payer: Self-pay | Admitting: Internal Medicine

## 2016-04-09 ENCOUNTER — Ambulatory Visit: Payer: Self-pay | Admitting: Internal Medicine

## 2016-09-01 ENCOUNTER — Encounter: Payer: Self-pay | Admitting: Internal Medicine

## 2017-01-25 ENCOUNTER — Encounter: Payer: Self-pay | Admitting: Internal Medicine

## 2017-01-25 ENCOUNTER — Ambulatory Visit (INDEPENDENT_AMBULATORY_CARE_PROVIDER_SITE_OTHER): Payer: BLUE CROSS/BLUE SHIELD | Admitting: Internal Medicine

## 2017-01-25 VITALS — BP 124/84 | HR 76 | Temp 97.2°F | Resp 16 | Ht 70.0 in | Wt 240.2 lb

## 2017-01-25 DIAGNOSIS — E782 Mixed hyperlipidemia: Secondary | ICD-10-CM

## 2017-01-25 DIAGNOSIS — E669 Obesity, unspecified: Secondary | ICD-10-CM

## 2017-01-25 DIAGNOSIS — R7303 Prediabetes: Secondary | ICD-10-CM | POA: Diagnosis not present

## 2017-01-25 DIAGNOSIS — E559 Vitamin D deficiency, unspecified: Secondary | ICD-10-CM

## 2017-01-25 DIAGNOSIS — Z79899 Other long term (current) drug therapy: Secondary | ICD-10-CM

## 2017-01-25 DIAGNOSIS — I10 Essential (primary) hypertension: Secondary | ICD-10-CM

## 2017-01-25 LAB — CBC WITH DIFFERENTIAL/PLATELET
Basophils Absolute: 0 cells/uL (ref 0–200)
Basophils Relative: 0 %
EOS PCT: 2 %
Eosinophils Absolute: 128 cells/uL (ref 15–500)
HCT: 45.8 % (ref 38.5–50.0)
Hemoglobin: 15.9 g/dL (ref 13.2–17.1)
LYMPHS ABS: 2048 {cells}/uL (ref 850–3900)
Lymphocytes Relative: 32 %
MCH: 31.7 pg (ref 27.0–33.0)
MCHC: 34.7 g/dL (ref 32.0–36.0)
MCV: 91.4 fL (ref 80.0–100.0)
MONO ABS: 512 {cells}/uL (ref 200–950)
MONOS PCT: 8 %
MPV: 9.6 fL (ref 7.5–12.5)
NEUTROS PCT: 58 %
Neutro Abs: 3712 cells/uL (ref 1500–7800)
PLATELETS: 246 10*3/uL (ref 140–400)
RBC: 5.01 MIL/uL (ref 4.20–5.80)
RDW: 13.3 % (ref 11.0–15.0)
WBC: 6.4 10*3/uL (ref 3.8–10.8)

## 2017-01-25 NOTE — Progress Notes (Signed)
This very nice 66 y.o. MWM presents belatedly for  follow up  (last seen 1 year ago) with Hypertension, Hyperlipidemia, Pre-Diabetes and Vitamin D Deficiency.      Patient is treated for HTN (2001)  & BP has been controlled at home. Today's BP is at goal - 124/84. Patient has had no complaints of any cardiac type chest pain, palpitations, dyspnea/orthopnea/PND, dizziness, claudication, or dependent edema.     Hyperlipidemia is controlled with diet. Last Lipids were at goal: Lab Results  Component Value Date   CHOL 136 12/19/2015   HDL 46 12/19/2015   LDLCALC 79 12/19/2015   TRIG 56 12/19/2015   CHOLHDL 3.0 12/19/2015      Patient has Morbid Obesity and had lost about 50# over the course of a year from 258# down to 202 # in Nov 2016, but now has regained back up to 240 # over the last year. Also, the patient has history of PreDiabetes (2011) and also has CKD2  (GFR 67 ml/min) and he has had no symptoms of reactive hypoglycemia, diabetic polys, paresthesias or visual blurring.  Last A1c was near goal: Lab Results  Component Value Date   HGBA1C 5.7 (H) 12/19/2015      Further, the patient also has history of Vitamin D Deficiency ("16" in 2008)  and supplements vitamin D without any suspected side-effects. Last vitamin D was at goal:  Lab Results  Component Value Date   VD25OH 92 07/30/2015   Current Outpatient Prescriptions on File Prior to Visit  Medication Sig  . Cholecalciferol (VITAMIN D-3) 5000 UNITS TABS Take 10,000 Units by mouth daily.  . fluticasone (FLONASE) 50 MCG/ACT nasal spray Place 2 sprays into both nostrils daily.  Marland Kitchen loratadine (CLARITIN) 10 MG tablet Take 10 mg by mouth daily.   Allergies  Allergen Reactions  . Penicillins     REACTION: unknown   PMHx:   Past Medical History:  Diagnosis Date  . Allergic rhinitis   . BPH (benign prostatic hyperplasia)   . GERD (gastroesophageal reflux disease)   . Hyperlipidemia   . Hypertension   . Hypogonadism, male     . Kidney stones   . Morbid obesity (HCC)   . Pre-diabetes   . Vitamin D deficiency    Immunization History  Administered Date(s) Administered  . DTaP 01/26/2000  . Influenza Whole 06/22/2013  . Influenza, High Dose Seasonal PF 06/27/2015  . Influenza-Unspecified 07/13/2014, 06/26/2016  . PPD Test 05/29/2014, 07/30/2015  . Tdap 09/06/2014  . Zoster 04/22/2013   Past Surgical History:  Procedure Laterality Date  . FLEXIBLE SIGMOIDOSCOPY  1992  . KNEE SURGERY Right 1977  . TONSILLECTOMY AND ADENOIDECTOMY  1960   FHx:    Reviewed / unchanged  SHx:    Reviewed / unchanged  Systems Review:  Constitutional: Denies fever, chills, wt changes, headaches, insomnia, fatigue, night sweats, change in appetite. Eyes: Denies redness, blurred vision, diplopia, discharge, itchy, watery eyes.  ENT: Denies discharge, congestion, post nasal drip, epistaxis, sore throat, earache, hearing loss, dental pain, tinnitus, vertigo, sinus pain, snoring.  CV: Denies chest pain, palpitations, irregular heartbeat, syncope, dyspnea, diaphoresis, orthopnea, PND, claudication or edema. Respiratory: denies cough, dyspnea, DOE, pleurisy, hoarseness, laryngitis, wheezing.  Gastrointestinal: Denies dysphagia, odynophagia, heartburn, reflux, water brash, abdominal pain or cramps, nausea, vomiting, bloating, diarrhea, constipation, hematemesis, melena, hematochezia  or hemorrhoids. Genitourinary: Denies dysuria, frequency, urgency, nocturia, hesitancy, discharge, hematuria or flank pain. Musculoskeletal: Denies arthralgias, myalgias, stiffness, jt. swelling, pain, limping or  strain/sprain.  Skin: Denies pruritus, rash, hives, warts, acne, eczema or change in skin lesion(s). Neuro: No weakness, tremor, incoordination, spasms, paresthesia or pain. Psychiatric: Denies confusion, memory loss or sensory loss. Endo: Denies change in weight, skin or hair change.  Heme/Lymph: No excessive bleeding, bruising or enlarged lymph  nodes.  Physical Exam  BP 124/84   Pulse 76   Temp 97.2 F (36.2 C)   Resp 16   Ht  (1.778 m)   Wt 240 lb 3.2 oz (109 kg)   BMI 34.47 kg/m   Appears Over nourished, well groomed  and in no distress.  Eyes: PERRLA, EOMs, conjunctiva no swelling or erythema. Sinuses: No frontal/maxillary tenderness ENT/Mouth: EAC's clear, TM's nl w/o erythema, bulging. Nares clear w/o erythema, swelling, exudates. Oropharynx clear without erythema or exudates. Oral hygiene is good. Tongue normal, non obstructing. Hearing intact.  Neck: Supple. Thyroid nl. Car 2+/2+ without bruits, nodes or JVD. Chest: Respirations nl with BS clear & equal w/o rales, rhonchi, wheezing or stridor.  Cor: Heart sounds normal w/ regular rate and rhythm without sig. murmurs, gallops, clicks or rubs. Peripheral pulses normal and equal  without edema.  Abdomen: Soft & bowel sounds normal. Non-tender w/o guarding, rebound, hernias, masses or organomegaly.  Lymphatics: Unremarkable.  Musculoskeletal: Full ROM all peripheral extremities, joint stability, 5/5 strength and normal gait.  Skin: Warm, dry without exposed rashes, lesions or ecchymosis apparent.  Neuro: Cranial nerves intact, reflexes equal bilaterally. Sensory-motor testing grossly intact. Tendon reflexes grossly intact.  Pysch: Alert & oriented x 3.  Insight and judgement nl & appropriate. No ideations.  Assessment and Plan:  1. Essential hypertension  - Continue medication, monitor blood pressure at home.  - Continue DASH diet. Reminder to go to the ER if any CP,  SOB, nausea, dizziness, severe HA, changes vision/speech,  left arm numbness and tingling and jaw pain.  - CBC with Differential/Platelet - BASIC METABOLIC PANEL WITH GFR - Magnesium - TSH  2. Hyperlipidemia, mixed  - Continue diet/meds, exercise,& lifestyle modifications.  - Continue monitor periodic cholesterol/liver & renal functions   - Hepatic function panel - Lipid panel -  TSH  3. Prediabetes  - Continue diet, exercise, lifestyle modifications.  - Monitor appropriate labs.  - Hemoglobin A1c - Insulin, random  4. Vitamin D deficiency  - Continue supplementation.  - VITAMIN D 25 Hydroxy   5. Medication management  - CBC with Differential/Platelet - BASIC METABOLIC PANEL WITH GFR - Hepatic function panel - Magnesium - Lipid panel - TSH - Hemoglobin A1c - Insulin, random - VITAMIN D 25 Hydroxy       Discussed  regular exercise, BP monitoring, weight control to achieve/maintain BMI less than 25 and discussed med and SE's. Recommended labs to assess and monitor clinical status with further disposition pending results of labs. Over 30 minutes of exam, counseling, chart review was performed.

## 2017-01-25 NOTE — Patient Instructions (Signed)

## 2017-01-26 ENCOUNTER — Other Ambulatory Visit: Payer: Self-pay | Admitting: Internal Medicine

## 2017-01-26 LAB — HEPATIC FUNCTION PANEL
ALT: 21 U/L (ref 9–46)
AST: 19 U/L (ref 10–35)
Albumin: 4.4 g/dL (ref 3.6–5.1)
Alkaline Phosphatase: 49 U/L (ref 40–115)
BILIRUBIN INDIRECT: 0.5 mg/dL (ref 0.2–1.2)
BILIRUBIN TOTAL: 0.7 mg/dL (ref 0.2–1.2)
Bilirubin, Direct: 0.2 mg/dL (ref <=0.2)
Total Protein: 6.9 g/dL (ref 6.1–8.1)

## 2017-01-26 LAB — LIPID PANEL
Cholesterol: 149 mg/dL (ref <200)
HDL: 42 mg/dL (ref >40)
LDL Cholesterol: 87 mg/dL (ref <100)
TRIGLYCERIDES: 101 mg/dL (ref <150)
Total CHOL/HDL Ratio: 3.5 Ratio (ref <5.0)
VLDL: 20 mg/dL (ref <30)

## 2017-01-26 LAB — BASIC METABOLIC PANEL WITH GFR
BUN: 15 mg/dL (ref 7–25)
CALCIUM: 9.5 mg/dL (ref 8.6–10.3)
CO2: 24 mmol/L (ref 20–31)
CREATININE: 1.17 mg/dL (ref 0.70–1.25)
Chloride: 104 mmol/L (ref 98–110)
GFR, EST AFRICAN AMERICAN: 75 mL/min (ref >=60)
GFR, EST NON AFRICAN AMERICAN: 65 mL/min (ref >=60)
Glucose, Bld: 101 mg/dL — ABNORMAL HIGH (ref 65–99)
Potassium: 3.9 mmol/L (ref 3.5–5.3)
SODIUM: 140 mmol/L (ref 135–146)

## 2017-01-26 LAB — HEMOGLOBIN A1C
HEMOGLOBIN A1C: 5.4 % (ref <5.7)
Mean Plasma Glucose: 108 mg/dL

## 2017-01-26 LAB — INSULIN, RANDOM: Insulin: 8.5 u[IU]/mL (ref 2.0–19.6)

## 2017-01-26 LAB — MAGNESIUM: MAGNESIUM: 2 mg/dL (ref 1.5–2.5)

## 2017-01-26 LAB — TSH: TSH: 1.3 mIU/L (ref 0.40–4.50)

## 2017-01-26 LAB — VITAMIN D 25 HYDROXY (VIT D DEFICIENCY, FRACTURES): VIT D 25 HYDROXY: 79 ng/mL (ref 30–100)

## 2017-02-11 ENCOUNTER — Encounter: Payer: Self-pay | Admitting: Physician Assistant

## 2017-02-11 ENCOUNTER — Ambulatory Visit (INDEPENDENT_AMBULATORY_CARE_PROVIDER_SITE_OTHER): Payer: BLUE CROSS/BLUE SHIELD | Admitting: Physician Assistant

## 2017-02-11 ENCOUNTER — Other Ambulatory Visit: Payer: Self-pay

## 2017-02-11 VITALS — BP 140/92 | HR 67 | Temp 97.1°F | Resp 14 | Ht 70.0 in | Wt 246.2 lb

## 2017-02-11 DIAGNOSIS — J0100 Acute maxillary sinusitis, unspecified: Secondary | ICD-10-CM | POA: Diagnosis not present

## 2017-02-11 MED ORDER — FLUTICASONE PROPIONATE 50 MCG/ACT NA SUSP
2.0000 | Freq: Every day | NASAL | 3 refills | Status: DC
Start: 2017-02-11 — End: 2018-01-19

## 2017-02-11 MED ORDER — PREDNISONE 20 MG PO TABS: ORAL_TABLET | ORAL | 0 refills | Status: DC

## 2017-02-11 MED ORDER — AZITHROMYCIN 250 MG PO TABS: ORAL_TABLET | ORAL | 1 refills | Status: AC

## 2017-02-11 NOTE — Progress Notes (Signed)
Subjective:    Patient ID: Alex Lewis, male    DOB: [birth year], 66 y.o.   MRN: [id number]  HPI 66 y.o. obese WM presents with sinusitis for several weeks, worse x 1 week. Wife was also sick, has been on allergy pills. Has right ear pain, sinus congestion and pain, right worse than left, cough occ productive.   BP has been good at home.   Blood pressure (!) 140/92, pulse 67, temperature 97.1 F (36.2 C), resp. rate 14, height 5\' 10"  (1.778 m), weight 246 lb 3.2 oz (111.7 kg), SpO2 97 %.  Medications Current Outpatient Prescriptions on File Prior to Visit  Medication Sig  . aspirin EC 81 MG tablet Take 81 mg by mouth daily.  . Cholecalciferol (VITAMIN D-3) 5000 UNITS TABS Take 10,000 Units by mouth daily.  . fluticasone (FLONASE) 50 MCG/ACT nasal spray Place 2 sprays into both nostrils daily.  Marland Kitchen loratadine (CLARITIN) 10 MG tablet Take 10 mg by mouth daily.   No current facility-administered medications on file prior to visit.     Problem list He has Essential hypertension; Hyperlipidemia; Prediabetes; Vitamin D deficiency; Medication management; GERD ; Depression, controlled; Morbid obesity (HCC); BMI 29.0-29.9,adult; and Obesity, Class II, BMI 35-39.9 on his problem list.  Review of Systems  Constitutional: Negative for chills, diaphoresis and fever.  HENT: Positive for congestion, ear pain, postnasal drip, sinus pressure, sneezing and sore throat. Negative for trouble swallowing and voice change.   Eyes: Negative.   Respiratory: Positive for cough. Negative for chest tightness, shortness of breath and wheezing.   Cardiovascular: Negative.   Gastrointestinal: Negative.   Genitourinary: Negative.   Musculoskeletal: Negative for neck pain.  Neurological: Negative.  Negative for headaches.       Objective:   Physical Exam  Constitutional: He is oriented to person, place, and time. He appears well-developed and well-nourished.  HENT:  Head: Normocephalic and atraumatic.   Right Ear: Tympanic membrane is injected. A middle ear effusion is present. Decreased hearing is noted.  Left Ear: Hearing and tympanic membrane normal.  Nose: Right sinus exhibits maxillary sinus tenderness. Left sinus exhibits maxillary sinus tenderness.  Mouth/Throat: Uvula is midline and mucous membranes are normal. Posterior oropharyngeal erythema present. No oropharyngeal exudate, posterior oropharyngeal edema or tonsillar abscesses.  Eyes: Conjunctivae are normal. Pupils are equal, round, and reactive to light.  Neck: Normal range of motion. Neck supple.  Cardiovascular: Normal rate and regular rhythm.   Pulmonary/Chest: Effort normal and breath sounds normal.  Abdominal: Soft. Bowel sounds are normal.  Musculoskeletal: Normal range of motion.  Lymphadenopathy:    He has no cervical adenopathy.  Neurological: He is alert and oriented to person, place, and time.  Skin: Skin is warm and dry. No rash noted.       Assessment & Plan:   Acute maxillary sinusitis, recurrence not specified - azithromycin (ZITHROMAX) 250 MG tablet; Take 2 tablets (500 mg) on  Day 1,  followed by 1 tablet (250 mg) once daily on Days 2 through 5.  Dispense: 6 each; Refill: 1 - predniSONE (DELTASONE) 20 MG tablet; 2 tablets daily for 3 days, 1 tablet daily for 4 days.  Dispense: 10 tablet; Refill: 0  Hypertension - monitor at home, DASH diet, exercise and monitor at home. Call if greater than 130/80.   The patient was advised to call immediately if he has any concerning symptoms in the interval. The patient voices understanding of current treatment options and is in agreement with the current  care plan.The patient knows to call the clinic with any problems, questions or concerns or go to the ER if any further progression of symptoms.

## 2017-02-11 NOTE — Patient Instructions (Signed)

## 2017-02-18 ENCOUNTER — Other Ambulatory Visit: Payer: Self-pay | Admitting: Internal Medicine

## 2017-05-13 ENCOUNTER — Encounter: Payer: Self-pay | Admitting: Internal Medicine

## 2017-05-13 ENCOUNTER — Ambulatory Visit (INDEPENDENT_AMBULATORY_CARE_PROVIDER_SITE_OTHER): Payer: BLUE CROSS/BLUE SHIELD | Admitting: Internal Medicine

## 2017-05-13 VITALS — BP 134/98 | HR 76 | Temp 97.5°F | Resp 18 | Ht 70.0 in | Wt 242.6 lb

## 2017-05-13 DIAGNOSIS — Z0001 Encounter for general adult medical examination with abnormal findings: Secondary | ICD-10-CM

## 2017-05-13 DIAGNOSIS — E559 Vitamin D deficiency, unspecified: Secondary | ICD-10-CM | POA: Diagnosis not present

## 2017-05-13 DIAGNOSIS — F411 Generalized anxiety disorder: Secondary | ICD-10-CM

## 2017-05-13 DIAGNOSIS — Z1212 Encounter for screening for malignant neoplasm of rectum: Secondary | ICD-10-CM

## 2017-05-13 DIAGNOSIS — R5383 Other fatigue: Secondary | ICD-10-CM | POA: Diagnosis not present

## 2017-05-13 DIAGNOSIS — I10 Essential (primary) hypertension: Secondary | ICD-10-CM | POA: Diagnosis not present

## 2017-05-13 DIAGNOSIS — K219 Gastro-esophageal reflux disease without esophagitis: Secondary | ICD-10-CM | POA: Diagnosis not present

## 2017-05-13 DIAGNOSIS — R7303 Prediabetes: Secondary | ICD-10-CM

## 2017-05-13 DIAGNOSIS — Z23 Encounter for immunization: Secondary | ICD-10-CM

## 2017-05-13 DIAGNOSIS — E782 Mixed hyperlipidemia: Secondary | ICD-10-CM | POA: Diagnosis not present

## 2017-05-13 DIAGNOSIS — Z125 Encounter for screening for malignant neoplasm of prostate: Secondary | ICD-10-CM

## 2017-05-13 DIAGNOSIS — Z136 Encounter for screening for cardiovascular disorders: Secondary | ICD-10-CM

## 2017-05-13 DIAGNOSIS — Z79899 Other long term (current) drug therapy: Secondary | ICD-10-CM | POA: Diagnosis not present

## 2017-05-13 DIAGNOSIS — Z111 Encounter for screening for respiratory tuberculosis: Secondary | ICD-10-CM

## 2017-05-13 MED ORDER — CITALOPRAM HYDROBROMIDE 20 MG PO TABS: ORAL_TABLET | ORAL | 1 refills | Status: DC

## 2017-05-13 MED ORDER — BISOPROLOL-HYDROCHLOROTHIAZIDE 5-6.25 MG PO TABS: ORAL_TABLET | ORAL | 1 refills | Status: DC

## 2017-05-13 MED ORDER — RANITIDINE HCL 150 MG PO TABS: 150.0000 mg | ORAL_TABLET | Freq: Two times a day (BID) | ORAL | Status: DC

## 2017-05-13 NOTE — Progress Notes (Signed)
[facility]   [name], M.D.      Dyanne Carrel. Steffanie Dunn, P.A.-C [facility]                [address]                [place], [place]. [address] Telephone [phone] Telefax [phone] Annual  Screening/Preventative Visit  & Comprehensive Evaluation & Examination     This very nice 66 y.o. MWM presents for a Screening/Preventative Visit & comprehensive evaluation and management of multiple medical co-morbidities.  Patient has been followed for HTN, Prediabetes, Hyperlipidemia and Vitamin D Deficiency. Patient has GERD controlled with his OTC Ranitidine 1-2 x/ day.       HTN predates since 2001. Patient's BP has been controlled at home.  Today's BP is elevated at 134/98 , by nurse and elevated again at 165/102.  Patient denies any cardiac symptoms as chest pain, palpitations, shortness of breath, dizziness or ankle swelling.     Patient's hyperlipidemia is controlled with diet and medications. Patient denies myalgias or other medication SE's. 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 Last A1c was at goal: Lab Results  Component Value Date   HGBA1C 5.4 01/25/2017       Finally, patient has history of Vitamin D Deficiency ("16" in 2008) and last vitamin D was at goal: Lab Results  Component Value Date   VD25OH 79 01/25/2017   Current Outpatient Prescriptions on File Prior to Visit  Medication Sig  . aspirin EC 81 MG tablet Take 81 mg by mouth daily.  . Cholecalciferol (VITAMIN D-3) 5000 UNITS TABS Take 10,000 Units by mouth daily.  . fluticasone (FLONASE) 50 MCG/ACT nasal spray Place  2 sprays into both nostrils daily.  Marland Kitchen loratadine (CLARITIN) 10 MG tablet Take 10 mg by mouth daily.   No current facility-administered medications on file prior to visit.    Allergies  Allergen Reactions  . Penicillins     REACTION: unknown   Past Medical History:  Diagnosis Date  . Allergic rhinitis   . BPH (benign prostatic hyperplasia)   . GERD (gastroesophageal reflux disease)   . Hyperlipidemia   . Hypertension   . Hypogonadism, male   . Kidney stones   . Morbid obesity (HCC)   . Pre-diabetes   . Vitamin D deficiency    Health Maintenance  Topic Date Due  . Hepatitis C Screening  April 13, 1951  . PNA vac Low Risk Adult (1 of 2 - PCV13) 03/30/2016  . INFLUENZA VACCINE  04/27/2017  . COLONOSCOPY  02/15/2018  . TETANUS/TDAP  09/06/2024   Immunization History  Administered Date(s) Administered  . DTaP 01/26/2000  . Influenza Whole 06/22/2013  . Influenza, High Dose Seasonal PF 06/27/2015  . Influenza-Unspecified 07/13/2014, 06/26/2016  . PPD Test 05/29/2014, 07/30/2015  . Pneumococcal Conjugate-13 05/13/2017  . Tdap 09/06/2014  . Zoster 04/22/2013   Past Surgical History:  Procedure Laterality Date  . FLEXIBLE SIGMOIDOSCOPY  1992  . KNEE SURGERY Right 1977  . TONSILLECTOMY AND ADENOIDECTOMY  1960   Family History  Problem Relation Age  of Onset  . Cancer Father        lung  . Diabetes Father   . Diabetes Brother    Social History   Social History  . Marital status: Married    Spouse name: N/A  . Number of children: N/A  . Years of education: N/A   Occupational History  . Not on file.   Social History Main Topics  . Smoking status: Former Smoker - Quit - 1245   . Smokeless tobacco: Never Used  . Alcohol use Yesoccasional  . Drug use: No  . Sexual activity: Not on file    ROS Constitutional: Denies fever, chills, weight loss/gain, headaches, insomnia,  night sweats or change in appetite. Does c/o fatigue. Eyes: Denies redness, blurred vision,  diplopia, discharge, itchy or watery eyes.  ENT: Denies discharge, congestion, post nasal drip, epistaxis, sore throat, earache, hearing loss, dental pain, Tinnitus, Vertigo, Sinus pain or snoring.  Cardio: Denies chest pain, palpitations, irregular heartbeat, syncope, dyspnea, diaphoresis, orthopnea, PND, claudication or edema Respiratory: denies cough, dyspnea, DOE, pleurisy, hoarseness, laryngitis or wheezing.  Gastrointestinal: Denies dysphagia, heartburn, reflux, water brash, pain, cramps, nausea, vomiting, bloating, diarrhea, constipation, hematemesis, melena, hematochezia, jaundice or hemorrhoids Genitourinary: Denies dysuria, frequency, urgency, nocturia, hesitancy, discharge, hematuria or flank pain Musculoskeletal: Denies arthralgia, myalgia, stiffness, Jt. Swelling, pain, limp or strain/sprain. Denies Falls. Skin: Denies puritis, rash, hives, warts, acne, eczema or change in skin lesion Neuro: No weakness, tremor, incoordination, spasms, paresthesia or pain Psychiatric: Denies confusion, memory loss or sensory loss. Denies Depression. Endocrine: Denies change in weight, skin, hair change, nocturia, and paresthesia, diabetic polys, visual blurring or hyper / hypo glycemic episodes.  Heme/Lymph: No excessive bleeding, bruising or enlarged lymph nodes.  Physical Exam  BP (!) 134/98   Pulse 76   Temp (!) 97.5 F (36.4 C)   Resp 18   Ht 5\' 10"  (1.778 m)   Wt 242 lb 9.6 oz (110 kg)   BMI 34.81 kg/m   General Appearance: Well nourished and well groomed and in no apparent distress.  Eyes: PERRLA, EOMs, conjunctiva no swelling or erythema, normal fundi and vessels. Sinuses: No frontal/maxillary tenderness ENT/Mouth: EACs patent / TMs  nl. Nares clear without erythema, swelling, mucoid exudates. Oral hygiene is good. No erythema, swelling, or exudate. Tongue normal, non-obstructing. Tonsils not swollen or erythematous. Hearing normal.  Neck: Supple, thyroid normal. No bruits, nodes or  JVD. Respiratory: Respiratory effort normal.  BS equal and clear bilateral without rales, rhonci, wheezing or stridor. Cardio: Heart sounds are normal with regular rate and rhythm and no murmurs, rubs or gallops. Peripheral pulses are normal and equal bilaterally without edema. No aortic or femoral bruits. Chest: symmetric with normal excursions and percussion.  Abdomen: Soft, with Nl bowel sounds. Nontender, no guarding, rebound, hernias, masses, or organomegaly.  Lymphatics: Non tender without lymphadenopathy.  Genitourinary: No hernias.Testes nl. DRE - prostate nl for age - smooth & firm w/o nodules. Musculoskeletal: Full ROM all peripheral extremities, joint stability, 5/5 strength, and normal gait. Skin: Warm and dry without rashes, lesions, cyanosis, clubbing or  ecchymosis.  Neuro: Cranial nerves intact, reflexes equal bilaterally. Normal muscle tone, no cerebellar symptoms. Sensation intact.  Pysch: Alert and oriented X 3 with normal affect, insight and judgment appropriate.   Assessment and Plan  1. Annual Preventative/Screening Exam   2. Essential hypertension  - EKG 12-Lead - [place], RETROPERITNL ABD,  LTD - Urinalysis, Routine w reflex microscopic - Microalbumin / creatinine urine ratio - CBC with  Differential/Platelet - BASIC METABOLIC PANEL WITH GFR - Magnesium - TSH  - bisoprolol-hctz (ZIAC) 5-6.25 MG tablet; Take 1 tablet daily for BP  Dispense: 90 tablet; Refill: 1  3. Hyperlipidemia, mixed  - EKG 12-Lead - [place], RETROPERITNL ABD,  LTD - Hepatic function panel - Lipid panel - TSH  4. Prediabetes  - EKG 12-Lead - [place], RETROPERITNL ABD,  LTD - Hemoglobin A1c - Insulin, random  5. Vitamin D deficiency  - VITAMIN D 25 Hydroxy   6. Gastroesophageal reflux disease   7. Screening for rectal cancer  - POC Hemoccult Bld/Stl   8. Screening for prostate cancer  - PSA  9. Screening examination for pulmonary tuberculosis   10. Screening for ischemic heart  disease  - EKG 12-Lead  11. Screening for AAA (aortic abdominal aneurysm)  - [place], RETROPERITNL ABD,  LTD  12. Fatigue  - Vitamin B12 - Iron,Total/Total Iron Binding Cap - Testosterone  13. Anxiety state  - citalopram ) 20 MG tablet; Take 1 tablet daily for mood  Disp: 90 tablet; Refill: 1  14. Need for prophylactic vaccination against Streptococcus pneumoniae (pneumococcus)  - Pneumococcal conjugate vaccine 13-valent  15. Medication management  - Urinalysis, Routine w reflex microscopic - Microalbumin / creatinine urine ratio - CBC with Differential/Platelet - BASIC METABOLIC PANEL WITH GFR - Magnesium - Lipid panel - TSH - Hemoglobin A1c - Insulin, random - VITAMIN D 25 Hydroxy        Patient also was started on Citalopram for stress response ans anxiety attacks. Patient was counseled in prudent diet, weight control to achieve/maintain BMI less than 25, BP monitoring, regular exercise and medications as discussed.  Discussed med effects and SE's. Routine screening labs and tests as requested with regular follow-up as recommended. Over 40 minutes of exam, counseling, chart review and high complex critical decision making was performed

## 2017-05-13 NOTE — Patient Instructions (Addendum)

## 2017-05-14 ENCOUNTER — Other Ambulatory Visit: Payer: Self-pay | Admitting: Internal Medicine

## 2017-05-14 DIAGNOSIS — N289 Disorder of kidney and ureter, unspecified: Secondary | ICD-10-CM

## 2017-05-14 LAB — LIPID PANEL
CHOL/HDL RATIO: 3.1 (calc) (ref <5.0)
CHOLESTEROL: 150 mg/dL (ref <200)
HDL: 48 mg/dL (ref >40)
LDL CHOLESTEROL (CALC): 88 mg/dL
Non-HDL Cholesterol (Calc): 102 mg/dL (calc) (ref <130)
Triglycerides: 54 mg/dL (ref <150)

## 2017-05-14 LAB — URINALYSIS, ROUTINE W REFLEX MICROSCOPIC
Bilirubin Urine: NEGATIVE
Glucose, UA: NEGATIVE
Hgb urine dipstick: NEGATIVE
Ketones, ur: NEGATIVE
Leukocytes, UA: NEGATIVE
Nitrite: NEGATIVE
Protein, ur: NEGATIVE
SPECIFIC GRAVITY, URINE: 1.01 (ref 1.001–1.03)
pH: 5.5 (ref 5.0–8.0)

## 2017-05-14 LAB — HEPATIC FUNCTION PANEL
AG Ratio: 1.8 (calc) (ref 1.0–2.5)
ALBUMIN MSPROF: 4.3 g/dL (ref 3.6–5.1)
ALKALINE PHOSPHATASE (APISO): 53 U/L (ref 40–115)
ALT: 23 U/L (ref 9–46)
AST: 18 U/L (ref 10–35)
BILIRUBIN DIRECT: 0.2 mg/dL (ref 0.0–0.2)
BILIRUBIN TOTAL: 0.7 mg/dL (ref 0.2–1.2)
Globulin: 2.4 g/dL (calc) (ref 1.9–3.7)
Indirect Bilirubin: 0.5 mg/dL (calc) (ref 0.2–1.2)
Total Protein: 6.7 g/dL (ref 6.1–8.1)

## 2017-05-14 LAB — BASIC METABOLIC PANEL WITH GFR
BUN/Creatinine Ratio: 11 (calc) (ref 6–22)
BUN: 14 mg/dL (ref 7–25)
CHLORIDE: 107 mmol/L (ref 98–110)
CO2: 23 mmol/L (ref 20–32)
CREATININE: 1.3 mg/dL — AB (ref 0.70–1.25)
Calcium: 9.3 mg/dL (ref 8.6–10.3)
GFR, EST AFRICAN AMERICAN: 66 mL/min/{1.73_m2} (ref >=60)
GFR, Est Non African American: 57 mL/min/{1.73_m2} — ABNORMAL LOW (ref >=60)
Glucose, Bld: 116 mg/dL — ABNORMAL HIGH (ref 65–99)
POTASSIUM: 4.4 mmol/L (ref 3.5–5.3)
SODIUM: 139 mmol/L (ref 135–146)

## 2017-05-14 LAB — CBC WITH DIFFERENTIAL/PLATELET
BASOS PCT: 0.8 %
Basophils Absolute: 52 cells/uL (ref 0–200)
EOS PCT: 2.9 %
Eosinophils Absolute: 189 cells/uL (ref 15–500)
HCT: 46.3 % (ref 38.5–50.0)
HEMOGLOBIN: 16.3 g/dL (ref 13.2–17.1)
Lymphs Abs: 1892 cells/uL (ref 850–3900)
MCH: 31.5 pg (ref 27.0–33.0)
MCHC: 35.2 g/dL (ref 32.0–36.0)
MCV: 89.6 fL (ref 80.0–100.0)
MONOS PCT: 8.2 %
MPV: 10.5 fL (ref 7.5–12.5)
NEUTROS ABS: 3835 {cells}/uL (ref 1500–7800)
Neutrophils Relative %: 59 %
PLATELETS: 226 10*3/uL (ref 140–400)
RBC: 5.17 10*6/uL (ref 4.20–5.80)
RDW: 12.4 % (ref 11.0–15.0)
TOTAL LYMPHOCYTE: 29.1 %
WBC mixed population: 533 cells/uL (ref 200–950)
WBC: 6.5 10*3/uL (ref 3.8–10.8)

## 2017-05-14 LAB — MAGNESIUM: Magnesium: 2.1 mg/dL (ref 1.5–2.5)

## 2017-05-14 LAB — MICROALBUMIN / CREATININE URINE RATIO
CREATININE, URINE: 55 mg/dL (ref 20–370)
MICROALB UR: 0.2 mg/dL
Microalb Creat Ratio: 4 mcg/mg creat (ref <30)

## 2017-05-14 LAB — HEMOGLOBIN A1C
Hgb A1c MFr Bld: 5.9 % of total Hgb — ABNORMAL HIGH (ref <5.7)
Mean Plasma Glucose: 123 (calc)
eAG (mmol/L): 6.8 (calc)

## 2017-05-14 LAB — URINE CULTURE
MICRO NUMBER: 80895073
RESULT: NO GROWTH
SPECIMEN QUALITY: ADEQUATE

## 2017-05-14 LAB — IRON, TOTAL/TOTAL IRON BINDING CAP
%SAT: 26 % (ref 15–60)
Iron: 88 ug/dL (ref 50–180)
TIBC: 336 mcg/dL (calc) (ref 250–425)

## 2017-05-14 LAB — TESTOSTERONE: Testosterone: 402 ng/dL (ref 250–827)

## 2017-05-14 LAB — TSH: TSH: 1.23 mIU/L (ref 0.40–4.50)

## 2017-05-14 LAB — PSA: PSA: 0.6 ng/mL (ref <=4.0)

## 2017-05-14 LAB — VITAMIN D 25 HYDROXY (VIT D DEFICIENCY, FRACTURES): Vit D, 25-Hydroxy: 88 ng/mL (ref 30–100)

## 2017-05-14 LAB — VITAMIN B12: VITAMIN B 12: 674 pg/mL (ref 200–1100)

## 2017-05-16 LAB — INSULIN, FASTING: INSULIN: 15.1 u[IU]/mL (ref 2.0–19.6)

## 2017-06-24 ENCOUNTER — Ambulatory Visit: Payer: Self-pay | Admitting: Internal Medicine

## 2017-06-25 NOTE — Progress Notes (Signed)
RESCHEDULED

## 2017-06-26 NOTE — Progress Notes (Signed)
   Patient ID: Rachael Darby, male    DOB: [birth year], 66 y.o.   MRN: [id number]  HPI   Patient is a nice 66 yo MWM with long hx/o labile HTN returns for 6 week f/u after adding Ziac for elevated BP 134/98 on May 13, 2017.  He had started Citalopram 20 mg along with Phentermine and reports feeling "spacy" & stopped both meds. Now he again reports feeling "stressed-out" at work along with all of his other perceived personal stressors. Today's BP was initially elevated at 138/90 and re-checked at 132/79. Last labs in mid August found Creat up from 1.17 -->> 1.30 and GFR down from 65-->>57 and patient was encouraged to increase fluids.   Medication Sig  . aspirin EC 81 MG  Take 81 mg by mouth daily.  . SUPER B COMPLEX Take 1 tablet by mouth daily.  . bisoprolol-hctz 5-6.25 MG Take 1 tablet daily for BP  . VITAMIN D 5000 UNITS  Take 10,000 Units by mouth daily.  Marland Kitchen FLONASE nasal spray Place 2 sprays into both nostrils daily.  Marland Kitchen loratadine  10 MG tablet Take 10 mg by mouth daily.  . ranitidine  150 MG tablet Take 1 tablet (150 mg total) by mouth 2 (two) times daily.  . citalopram20 MG tablet Take 1 tablet daily for mood - not taking   Allergies  Allergen Reactions  . Penicillins     REACTION: unknown   Past Medical History:  Diagnosis Date  . Allergic rhinitis   . BPH (benign prostatic hyperplasia)   . GERD (gastroesophageal reflux disease)   . Hyperlipidemia   . Hypertension   . Hypogonadism, male   . Kidney stones   . Morbid obesity (HCC)   . Pre-diabetes   . Vitamin D deficiency    Review of Systems  10 point systems review negative except as above.    Objective:   Physical Exam  BP 132/79   Pulse 60   Temp (!) 97.3 F (36.3 C)   Resp 18   Ht  (1.778 m)   Wt 239 lb (108.4 kg)   BMI 34.29 kg/m   HEENT - WNL. Neck - supple.  Chest - Clear equal BS. Cor - Nl HS. RRR w/o sig MGR. PP 1(+). No edema. MS- FROM w/o deformities.  Gait Nl. Neuro -  Nl w/o focal  abnormalities.    Assessment & Plan:   1. Essential hypertension  2. Renal insufficiency  - BASIC METABOLIC PANEL WITH GFR  3. Medication management  - BASIC METABOLIC PANEL WITH GFR  4. Anxiety   - encouraged to restart Citalopram 20 mg at 1/2 tab = 10 mg /daily   5. Need for immunization against influenza  - Flu vaccine HIGH DOSE PF (Fluzone High dose)

## 2017-06-27 ENCOUNTER — Encounter: Payer: Self-pay | Admitting: Internal Medicine

## 2017-06-27 ENCOUNTER — Ambulatory Visit (INDEPENDENT_AMBULATORY_CARE_PROVIDER_SITE_OTHER): Payer: BLUE CROSS/BLUE SHIELD | Admitting: Internal Medicine

## 2017-06-27 VITALS — BP 132/79 | HR 57 | Temp 97.3°F | Resp 18 | Ht 70.0 in | Wt 239.0 lb

## 2017-06-27 DIAGNOSIS — I10 Essential (primary) hypertension: Secondary | ICD-10-CM

## 2017-06-27 DIAGNOSIS — N289 Disorder of kidney and ureter, unspecified: Secondary | ICD-10-CM | POA: Diagnosis not present

## 2017-06-27 DIAGNOSIS — Z23 Encounter for immunization: Secondary | ICD-10-CM | POA: Diagnosis not present

## 2017-06-27 DIAGNOSIS — Z79899 Other long term (current) drug therapy: Secondary | ICD-10-CM

## 2017-06-27 LAB — BASIC METABOLIC PANEL WITH GFR
BUN/Creatinine Ratio: 13 (calc) (ref 6–22)
BUN: 17 mg/dL (ref 7–25)
CO2: 29 mmol/L (ref 20–32)
Calcium: 9.5 mg/dL (ref 8.6–10.3)
Chloride: 102 mmol/L (ref 98–110)
Creat: 1.33 mg/dL — ABNORMAL HIGH (ref 0.70–1.25)
GFR, EST AFRICAN AMERICAN: 64 mL/min/{1.73_m2} (ref >=60)
GFR, EST NON AFRICAN AMERICAN: 55 mL/min/{1.73_m2} — AB (ref >=60)
Glucose, Bld: 125 mg/dL — ABNORMAL HIGH (ref 65–99)
POTASSIUM: 4 mmol/L (ref 3.5–5.3)
SODIUM: 139 mmol/L (ref 135–146)

## 2017-08-23 NOTE — Progress Notes (Signed)
FOLLOW UP  Assessment and Plan:   Hypertension Well controlled with current medications  Monitor blood pressure at home; patient to call if consistently greater than 130/80 Continue DASH diet.   Reminder to go to the ER if any CP, SOB, nausea, dizziness, severe HA, changes vision/speech, left arm numbness and tingling and jaw pain.  Cholesterol Continue medication  Continue low cholesterol diet and exercise.  Check lipid panel.   Prediabetes Continue diet and exercise.  Perform daily foot/skin check, notify office of any concerning changes.  Check A1C  Obesity with co morbidities Long discussion about weight loss, diet, and exercise Discussed ideal weight for height and initial weight goal (240lb) Patient will work on portion control, eating dinner earlier, making small good decisions daily Will follow up in 3 months  Vitamin D Def/ osteoporosis prevention Continue supplementation Check Vit D level  GERD Well managed by lifestyle and ranitidine Discussed diet, avoiding triggers and other lifestyle changes - eat dinner earlier  Depression/anxiety Continue medications  Lifestyle discussed: diet/exerise, sleep hygiene, stress management, hydration  R ear pressure Eustachian tube swelling vs TMJ - exam unremarkable Recommended trial of flonase daily, discuss symptoms with dentist if continue  Continue diet and meds as discussed. Further disposition pending results of labs. Discussed med's effects and SE's.   Over 30 minutes of exam, counseling, chart review, and critical decision making was performed.   Future Appointments  Date Time Provider Department Center  11/30/2017 10:30 AM Lucky CowboyMcKeown, William, MD [facility] None  06/20/2018 10:00 AM Lucky CowboyMcKeown, William, MD [facility] None    ----------------------------------------------------------------------------------------------------------------------  HPI 66 y.o. male  presents for 3 month follow up on hypertension,  cholesterol, prediabetes, obesity, depression, GERD and vitamin D deficiency. He is also c/o some R ear pressure for the last 2 days, worse with chewing.    he has a diagnosis of depression and was prescribed celexa 20 mg - he reports he never took, reports symptoms are well controlled, and stress levels have improved. 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    BMI is Body mass index is 35.01 kg/m., he has not been working on diet and exercise. Wt Readings from Last 3 Encounters:  08/24/17 244 lb (110.7 kg)  06/27/17 239 lb (108.4 kg)  05/13/17 242 lb 9.6 oz (110 kg)   he has a diagnosis of GERD which is currently managed by ranitidine 150 mg he reports symptoms is currently well controlled, and denies breakthrough reflux, burning in chest, hoarseness or cough.    His blood pressure has been controlled at home, today their BP is BP: 122/72  He does not workout. He denies chest pain, shortness of breath, dizziness.   He is on cholesterol medication and denies myalgias. His cholesterol is at goal. The cholesterol last visit was:   Lab Results  Component Value Date   CHOL 150 05/13/2017   HDL 48 05/13/2017   LDLCALC 87 01/25/2017   TRIG 54 05/13/2017   CHOLHDL 3.1 05/13/2017    He has been working on diet for prediabetes, and denies increased appetite, nausea, paresthesia of the feet, polydipsia, polyuria, visual disturbances and vomiting. Last A1C in the office was:  Lab Results  Component Value Date   HGBA1C 5.9 (H) 05/13/2017   Patient is on Vitamin D supplement and at goal:   Lab Results  Component Value Date   VD25OH 88 05/13/2017      Current Medications:  Current Outpatient Medications on  File Prior to Visit  Medication Sig  . aspirin EC 81 MG tablet Take 81 mg by mouth daily.  . B Complex-C (SUPER B COMPLEX PO) Take 1 tablet by mouth daily.  . bisoprolol-hydrochlorothiazide (ZIAC) 5-6.25 MG tablet Take 1 tablet daily for BP   . Cholecalciferol (VITAMIN D-3) 5000 UNITS TABS Take 10,000 Units by mouth daily.  . fluticasone (FLONASE) 50 MCG/ACT nasal spray Place 2 sprays into both nostrils daily. (Patient taking differently: Place 2 sprays into both nostrils as needed. )  . loratadine (CLARITIN) 10 MG tablet Take 10 mg by mouth daily.  . ranitidine (ZANTAC) 150 MG tablet Take 1 tablet (150 mg total) by mouth 2 (two) times daily.  . citalopram (CELEXA) 20 MG tablet TAKE 1 TABLET DAILY FOR MOOD   No current facility-administered medications on file prior to visit.      Allergies:  Allergies  Allergen Reactions  . Penicillins     REACTION: unknown     Medical History:  Past Medical History:  Diagnosis Date  . Allergic rhinitis   . BPH (benign prostatic hyperplasia)   . GERD (gastroesophageal reflux disease)   . Hyperlipidemia   . Hypertension   . Hypogonadism, male   . Kidney stones   . Morbid obesity (HCC)   . Pre-diabetes   . Vitamin D deficiency    Family history- Reviewed and unchanged Social history- Reviewed and unchanged   Review of Systems:  Review of Systems  Constitutional: Negative for malaise/fatigue and weight loss.  HENT: Negative for hearing loss and tinnitus.   Eyes: Negative for blurred vision and double vision.  Respiratory: Negative for cough, shortness of breath and wheezing.   Cardiovascular: Negative for chest pain, palpitations, orthopnea, claudication and leg swelling.  Gastrointestinal: Negative for abdominal pain, blood in stool, constipation, diarrhea, heartburn, melena, nausea and vomiting.  Genitourinary: Negative.   Musculoskeletal: Negative for joint pain and myalgias.  Skin: Negative for rash.  Neurological: Negative for dizziness, tingling, sensory change, weakness and headaches.  Endo/Heme/Allergies: Negative for polydipsia.  Psychiatric/Behavioral: Negative.   All other systems reviewed and are negative.     Physical Exam: BP 122/72   Pulse 67   Temp  (!) 97.5 F (36.4 C)   Ht 5\' 10"  (1.778 m)   Wt 244 lb (110.7 kg)   SpO2 97%   BMI 35.01 kg/m  Wt Readings from Last 3 Encounters:  08/24/17 244 lb (110.7 kg)  06/27/17 239 lb (108.4 kg)  05/13/17 242 lb 9.6 oz (110 kg)   General Appearance: Well nourished, in no apparent distress. Eyes: PERRLA, EOMs, conjunctiva no swelling or erythema Sinuses: No Frontal/maxillary tenderness ENT/Mouth: Ext aud canals clear, TMs without erythema, bulging. No erythema, swelling, or exudate on post pharynx.  Tonsils not swollen or erythematous. Hearing normal.  Neck: Supple, thyroid normal.  Respiratory: Respiratory effort normal, BS equal bilaterally without rales, rhonchi, wheezing or stridor.  Cardio: RRR with no MRGs. Brisk peripheral pulses without edema.  Abdomen: Soft, + BS.  Non tender, no guarding, rebound, hernias, masses. Lymphatics: Non tender without lymphadenopathy.  Musculoskeletal: Full ROM, 5/5 strength, Normal gait Skin: Warm, dry without rashes, lesions, ecchymosis.  Neuro: Cranial nerves intact. No cerebellar symptoms.  Psych: Awake and oriented X 3, normal affect, Insight and Judgment appropriate.  PHQ-2: 0   Dan MakerAshley C Aizlyn Schifano, NP 10:37 AM [facility]

## 2017-08-24 ENCOUNTER — Encounter: Payer: Self-pay | Admitting: Adult Health

## 2017-08-24 ENCOUNTER — Ambulatory Visit (INDEPENDENT_AMBULATORY_CARE_PROVIDER_SITE_OTHER): Payer: BLUE CROSS/BLUE SHIELD | Admitting: Adult Health

## 2017-08-24 VITALS — BP 122/72 | HR 67 | Temp 97.5°F | Ht 70.0 in | Wt 244.0 lb

## 2017-08-24 DIAGNOSIS — I10 Essential (primary) hypertension: Secondary | ICD-10-CM

## 2017-08-24 DIAGNOSIS — E669 Obesity, unspecified: Secondary | ICD-10-CM | POA: Diagnosis not present

## 2017-08-24 DIAGNOSIS — E782 Mixed hyperlipidemia: Secondary | ICD-10-CM | POA: Diagnosis not present

## 2017-08-24 DIAGNOSIS — Z79899 Other long term (current) drug therapy: Secondary | ICD-10-CM

## 2017-08-24 DIAGNOSIS — E559 Vitamin D deficiency, unspecified: Secondary | ICD-10-CM | POA: Diagnosis not present

## 2017-08-24 DIAGNOSIS — R7303 Prediabetes: Secondary | ICD-10-CM | POA: Diagnosis not present

## 2017-08-24 DIAGNOSIS — K219 Gastro-esophageal reflux disease without esophagitis: Secondary | ICD-10-CM | POA: Diagnosis not present

## 2017-08-24 DIAGNOSIS — H938X1 Other specified disorders of right ear: Secondary | ICD-10-CM

## 2017-08-24 DIAGNOSIS — F329 Major depressive disorder, single episode, unspecified: Secondary | ICD-10-CM

## 2017-08-24 DIAGNOSIS — F32A Depression, unspecified: Secondary | ICD-10-CM

## 2017-08-24 NOTE — Patient Instructions (Signed)
We'd love to see very slow, gradual weight loss -   Please pick THREE things a month to change. Once it is a habit check off the item. Then pick another three items off the list to become habits.  If you are already doing a habit on the list GREAT!  Cross that item off! o Don't drink your calories. Ie, alcohol, soda, fruit juice, and sweet tea.  o Drink more water. Drink a glass when you feel hungry or before each meal.  o Eat breakfast - Complex carb and protein (likeDannon light and fit yogurt, oatmeal, fruit, eggs, [place]urkey bacon). o Measure your cereal.  Eat no more than one cup a day. (ie [place]ashi) o Eat an Avey a day. o Add a vegetable a day. o Try a new vegetable a month. o Use Pam! Stop using oil or butter to cook. o Don't finish your plate or use smaller plates. o Share your dessert. o Eat sugar free Jello for dessert or frozen grapes. o Don't eat 2-3 hours before bed. o Switch to whole wheat bread, pasta, and brown rice. o Make healthier choices when you eat out. No fries! o Pick baked chicken, NOT fried. o Don't forget to SLOW DOWN when you eat. It is not going anywhere.  o Take the stairs. o Park far away in the parking lot o [employer] Farm[employer] soup cans (or weights) for 10 minutes while watching TV. o Walk at work for 10 minutes during break. o Walk outside 1 time a week with your friend, kids, dog, or significant other. o Start a walking group at church. o Walk the mall as much as you can tolerate.  o Keep a food diary. o Weigh yourself daily. o Walk for 15 minutes 3 days per week. o Cook at home more often and eat out less.  If life happens and you go back to old habits, it is okay.  Just start over. You can do it!   If you experience chest pain, get short of breath, or tired during the exercise, please stop immediately and inform your doctor.

## 2017-08-25 LAB — VITAMIN D 25 HYDROXY (VIT D DEFICIENCY, FRACTURES): VIT D 25 HYDROXY: 93 ng/mL (ref 30–100)

## 2017-08-25 LAB — CBC WITH DIFFERENTIAL/PLATELET
BASOS ABS: 30 {cells}/uL (ref 0–200)
Basophils Relative: 0.5 %
EOS PCT: 2.3 %
Eosinophils Absolute: 138 cells/uL (ref 15–500)
HCT: 45.5 % (ref 38.5–50.0)
Hemoglobin: 16 g/dL (ref 13.2–17.1)
Lymphs Abs: 1680 cells/uL (ref 850–3900)
MCH: 31.7 pg (ref 27.0–33.0)
MCHC: 35.2 g/dL (ref 32.0–36.0)
MCV: 90.1 fL (ref 80.0–100.0)
MONOS PCT: 7.3 %
MPV: 10.4 fL (ref 7.5–12.5)
NEUTROS PCT: 61.9 %
Neutro Abs: 3714 cells/uL (ref 1500–7800)
PLATELETS: 206 10*3/uL (ref 140–400)
RBC: 5.05 10*6/uL (ref 4.20–5.80)
RDW: 12.3 % (ref 11.0–15.0)
Total Lymphocyte: 28 %
WBC: 6 10*3/uL (ref 3.8–10.8)
WBCMIX: 438 {cells}/uL (ref 200–950)

## 2017-08-25 LAB — BASIC METABOLIC PANEL WITH GFR
BUN/Creatinine Ratio: 14 (calc) (ref 6–22)
BUN: 18 mg/dL (ref 7–25)
CALCIUM: 9.3 mg/dL (ref 8.6–10.3)
CO2: 24 mmol/L (ref 20–32)
Chloride: 106 mmol/L (ref 98–110)
Creat: 1.26 mg/dL — ABNORMAL HIGH (ref 0.70–1.25)
GFR, Est African American: 68 mL/min/{1.73_m2} (ref >=60)
GFR, Est Non African American: 59 mL/min/{1.73_m2} — ABNORMAL LOW (ref >=60)
Glucose, Bld: 153 mg/dL — ABNORMAL HIGH (ref 65–99)
POTASSIUM: 4 mmol/L (ref 3.5–5.3)
Sodium: 139 mmol/L (ref 135–146)

## 2017-08-25 LAB — LIPID PANEL
CHOLESTEROL: 162 mg/dL (ref <200)
HDL: 49 mg/dL (ref >40)
LDL CHOLESTEROL (CALC): 94 mg/dL
Non-HDL Cholesterol (Calc): 113 mg/dL (calc) (ref <130)
TRIGLYCERIDES: 99 mg/dL (ref <150)
Total CHOL/HDL Ratio: 3.3 (calc) (ref <5.0)

## 2017-08-25 LAB — HEPATIC FUNCTION PANEL
AG Ratio: 1.6 (calc) (ref 1.0–2.5)
ALBUMIN MSPROF: 4.1 g/dL (ref 3.6–5.1)
ALKALINE PHOSPHATASE (APISO): 57 U/L (ref 40–115)
ALT: 14 U/L (ref 9–46)
AST: 15 U/L (ref 10–35)
BILIRUBIN DIRECT: 0.1 mg/dL (ref 0.0–0.2)
Globulin: 2.6 g/dL (calc) (ref 1.9–3.7)
Indirect Bilirubin: 0.7 mg/dL (calc) (ref 0.2–1.2)
Total Bilirubin: 0.8 mg/dL (ref 0.2–1.2)
Total Protein: 6.7 g/dL (ref 6.1–8.1)

## 2017-08-25 LAB — HEMOGLOBIN A1C
EAG (MMOL/L): 6.8 (calc)
HEMOGLOBIN A1C: 5.9 %{Hb} — AB (ref <5.7)
MEAN PLASMA GLUCOSE: 123 (calc)

## 2017-08-25 LAB — TSH: TSH: 1.16 m[IU]/L (ref 0.40–4.50)

## 2017-09-28 DIAGNOSIS — H66001 Acute suppurative otitis media without spontaneous rupture of ear drum, right ear: Secondary | ICD-10-CM | POA: Diagnosis not present

## 2017-09-28 DIAGNOSIS — H60391 Other infective otitis externa, right ear: Secondary | ICD-10-CM | POA: Diagnosis not present

## 2017-09-29 ENCOUNTER — Encounter: Payer: Self-pay | Admitting: Internal Medicine

## 2017-11-07 ENCOUNTER — Other Ambulatory Visit: Payer: Self-pay | Admitting: Internal Medicine

## 2017-11-07 DIAGNOSIS — I10 Essential (primary) hypertension: Secondary | ICD-10-CM

## 2017-11-23 DIAGNOSIS — I10 Essential (primary) hypertension: Secondary | ICD-10-CM | POA: Diagnosis not present

## 2017-11-23 DIAGNOSIS — Z0001 Encounter for general adult medical examination with abnormal findings: Secondary | ICD-10-CM | POA: Diagnosis not present

## 2017-11-30 ENCOUNTER — Encounter (INDEPENDENT_AMBULATORY_CARE_PROVIDER_SITE_OTHER): Payer: Self-pay

## 2017-11-30 ENCOUNTER — Ambulatory Visit: Payer: BLUE CROSS/BLUE SHIELD | Admitting: Internal Medicine

## 2017-11-30 ENCOUNTER — Encounter: Payer: Self-pay | Admitting: Internal Medicine

## 2017-11-30 VITALS — BP 118/82 | HR 68 | Temp 97.3°F | Resp 18 | Ht 70.0 in | Wt 240.2 lb

## 2017-11-30 DIAGNOSIS — I10 Essential (primary) hypertension: Secondary | ICD-10-CM

## 2017-11-30 DIAGNOSIS — R7303 Prediabetes: Secondary | ICD-10-CM | POA: Diagnosis not present

## 2017-11-30 DIAGNOSIS — J329 Chronic sinusitis, unspecified: Secondary | ICD-10-CM | POA: Diagnosis not present

## 2017-11-30 DIAGNOSIS — E559 Vitamin D deficiency, unspecified: Secondary | ICD-10-CM

## 2017-11-30 DIAGNOSIS — Z79899 Other long term (current) drug therapy: Secondary | ICD-10-CM | POA: Diagnosis not present

## 2017-11-30 DIAGNOSIS — E782 Mixed hyperlipidemia: Secondary | ICD-10-CM | POA: Diagnosis not present

## 2017-11-30 DIAGNOSIS — R7309 Other abnormal glucose: Secondary | ICD-10-CM

## 2017-11-30 MED ORDER — AZITHROMYCIN 250 MG PO TABS: ORAL_TABLET | ORAL | 1 refills | Status: DC

## 2017-11-30 MED ORDER — PREDNISONE 20 MG PO TABS: ORAL_TABLET | ORAL | 0 refills | Status: DC

## 2017-11-30 MED ORDER — PROMETHAZINE-DM 6.25-15 MG/5ML PO SYRP: ORAL_SOLUTION | ORAL | 1 refills | Status: DC

## 2017-11-30 NOTE — Progress Notes (Signed)
This very nice 67 y.o. MWM  presents for 6 month follow up with HTN, HLD, Pre-Diabetes and Vitamin D Deficiency.  Patient's GERD is controlled on his diet & meds. Also, he's c/o sinus congestion & pressure for several days.      Patient is treated for HTN (2001) & BP has been controlled at home. Today's BP is at goal - 118/82. Patient has had no complaints of any cardiac type chest pain, palpitations, dyspnea / orthopnea / PND, dizziness, claudication, or dependent edema.     Hyperlipidemia is controlled with diet & meds. Patient denies myalgias or other med SE's. Last Lipids were  Lab Results  Component Value Date   CHOL 162 08/24/2017   HDL 49 08/24/2017   LDLCALC 87 01/25/2017   TRIG 99 08/24/2017   CHOLHDL 3.3 08/24/2017      Also, the patient has history of Morbid Obesity (BMI 34+) and PreDiabetes  (A1c 6.2%/2011) and has had no symptoms of reactive hypoglycemia, diabetic polys, paresthesias or visual blurring.  Last A1c was still not at goal: Lab Results  Component Value Date   HGBA1C 5.9 (H) 08/24/2017      Further, the patient also has history of Vitamin D Deficiency ("16"/2008)  and supplements vitamin D without any suspected side-effects. Last vitamin D was at goal:  Lab Results  Component Value Date   VD25OH 93 08/24/2017   Current Outpatient Medications on File Prior to Visit  Medication Sig  . aspirin EC 81 MG tablet Take 81 mg by mouth daily.  . B Complex-C (SUPER B COMPLEX PO) Take 1 tablet by mouth daily.  . bisoprolol-hydrochlorothiazide (ZIAC) 5-6.25 MG tablet TAKE 1 TABLET DAILY FOR BLOOD PRESSURE  . Cholecalciferol (VITAMIN D-3) 5000 UNITS TABS Take 10,000 Units by mouth daily.  . fluticasone (FLONASE) 50 MCG/ACT nasal spray Place 2 sprays into both nostrils daily. (Patient taking differently: Place 2 sprays into both nostrils as needed. )  . loratadine (CLARITIN) 10 MG tablet Take 10 mg by mouth daily.  . ranitidine (ZANTAC) 150 MG tablet Take 1 tablet (150  mg total) by mouth 2 (two) times daily.   No current facility-administered medications on file prior to visit.    Allergies  Allergen Reactions  . Penicillins     REACTION: unknown   PMHx:   Past Medical History:  Diagnosis Date  . Allergic rhinitis   . BPH (benign prostatic hyperplasia)   . GERD (gastroesophageal reflux disease)   . Hyperlipidemia   . Hypertension   . Hypogonadism, male   . Kidney stones   . Morbid obesity (HCC)   . Pre-diabetes   . Vitamin D deficiency    Immunization History  Administered Date(s) Administered  . DTaP 01/26/2000  . Influenza Whole 06/22/2013  . Influenza, High Dose Seasonal PF 06/27/2015, 06/27/2017  . Influenza-Unspecified 07/13/2014, 06/26/2016  . PPD Test 05/29/2014, 07/30/2015  . Pneumococcal Conjugate-13 05/13/2017  . Tdap 09/06/2014  . Zoster 04/22/2013   Past Surgical History:  Procedure Laterality Date  . FLEXIBLE SIGMOIDOSCOPY  1992  . KNEE SURGERY Right 1977  . TONSILLECTOMY AND ADENOIDECTOMY  1960   FHx:    Reviewed / unchanged  SHx:    Reviewed / unchanged  Systems Review:  Constitutional: Denies fever, chills, wt changes, headaches, insomnia, fatigue, night sweats, change in appetite. Eyes: Denies redness, blurred vision, diplopia, discharge, itchy, watery eyes.  ENT: Denies discharge, congestion, post nasal drip, epistaxis, sore throat, earache, hearing loss, dental pain,  tinnitus, vertigo, sinus pain, snoring.  CV: Denies chest pain, palpitations, irregular heartbeat, syncope, dyspnea, diaphoresis, orthopnea, PND, claudication or edema. Respiratory: denies cough, dyspnea, DOE, pleurisy, hoarseness, laryngitis, wheezing.  Gastrointestinal: Denies dysphagia, odynophagia, heartburn, reflux, water brash, abdominal pain or cramps, nausea, vomiting, bloating, diarrhea, constipation, hematemesis, melena, hematochezia  or hemorrhoids. Genitourinary: Denies dysuria, frequency, urgency, nocturia, hesitancy, discharge,  hematuria or flank pain. Musculoskeletal: Denies arthralgias, myalgias, stiffness, jt. swelling, pain, limping or strain/sprain.  Skin: Denies pruritus, rash, hives, warts, acne, eczema or change in skin lesion(s). Neuro: No weakness, tremor, incoordination, spasms, paresthesia or pain. Psychiatric: Denies confusion, memory loss or sensory loss. Endo: Denies change in weight, skin or hair change.  Heme/Lymph: No excessive bleeding, bruising or enlarged lymph nodes.  Physical Exam  BP 118/82   Pulse 68   Temp (!) 97.3 F (36.3 C)   Resp 18   Ht 5\' 10"  (1.778 m)   Wt 240 lb 3.2 oz (109 kg)   BMI 34.47 kg/m   Appears  well nourished, well groomed  and in no distress.  Eyes: PERRLA, EOMs, conjunctiva no swelling or erythema. Sinuses:   (+) frontal/maxillary tenderness ENT/Mouth: EAC's clear, TM's nl w/o erythema, bulging. Nares clear w/o erythema, swelling, exudates. Oropharynx clear without erythema or exudates. Oral hygiene is good. Tongue normal, non obstructing. Hearing intact.  Neck: Supple. Thyroid not palpable. Car 2+/2+ without bruits, nodes or JVD. Chest: Respirations nl with BS clear & equal w/o rales, rhonchi, wheezing or stridor.  Cor: Heart sounds normal w/ regular rate and rhythm without sig. murmurs, gallops, clicks or rubs. Peripheral pulses normal and equal  without edema.  Abdomen: Soft & bowel sounds normal. Non-tender w/o guarding, rebound, hernias, masses or organomegaly.  Lymphatics: Unremarkable.  Musculoskeletal: Full ROM all peripheral extremities, joint stability, 5/5 strength and normal gait.  Skin: Warm, dry without exposed rashes, lesions or ecchymosis apparent.  Neuro: Cranial nerves intact, reflexes equal bilaterally. Sensory-motor testing grossly intact. Tendon reflexes grossly intact.  Pysch: Alert & oriented x 3.  Insight and judgement nl & appropriate. No ideations.  Assessment and Plan:  1. Essential hypertension  - Continue medication, monitor  blood pressure at home.  - Continue DASH diet. Reminder to go to the ER if any CP,  SOB, nausea, dizziness, severe HA, changes vision/speech.  - CBC with Differential/Platelet - BASIC METABOLIC PANEL WITH GFR - Magnesium - TSH  2. Hyperlipidemia, mixed  - Continue diet/meds, exercise,& lifestyle modifications.  - Continue monitor periodic cholesterol/liver & renal functions   - Hepatic function panel - Lipid panel - TSH  3. Abnormal glucose  - Hemoglobin A1c - Insulin, random  4. Vitamin D deficiency  - Continue diet, exercise, lifestyle modifications.  - Monitor appropriate labs. - Continue supplementation.  - VITAMIN D 25 Hydroxy (Vit-D Deficiency, Fractures)  5. Prediabetes  - Hemoglobin A1c - Insulin, random  6. Medication management  - CBC with Differential/Platelet - BASIC METABOLIC PANEL WITH GFR - Hepatic function panel - Magnesium - Lipid panel - TSH - Hemoglobin A1c - Insulin, random - VITAMIN D 25 Hydroxyl  7. Sinusitis  - predniSONE (DELTASONE) 20 MG tablet; 1 tab 3 x day for 3 days, then 1 tab 2 x day for 3 days, then 1 tab 1 x day for 5 days  Dispense: 20 tablet; Refill: 0  - azithromycin (ZITHROMAX) 250 MG tablet; Take 2 tablets (500 mg) on  Day 1,  followed by 1 tablet (250 mg) once daily on Days 2 through  5.  Dispense: 6 each; Refill: 1  - promethazine-dextromethorphan (PROMETHAZINE-DM) 6.25-15 MG/5ML syrup; Take 1 to 2 tsp enery 4 hours if needed for cough  Dispense: 360 mL; Refill: 1       Discussed  regular exercise, BP monitoring, weight control to achieve/maintain BMI less than 25 and discussed med and SE's. Recommended labs to assess and monitor clinical status with further disposition pending results of labs. Over 30 minutes of exam, counseling, chart review was performed.

## 2017-11-30 NOTE — Patient Instructions (Signed)

## 2017-12-01 LAB — LIPID PANEL
Cholesterol: 146 mg/dL (ref <200)
HDL: 38 mg/dL — ABNORMAL LOW (ref >40)
LDL Cholesterol (Calc): 89 mg/dL (calc)
NON-HDL CHOLESTEROL (CALC): 108 mg/dL (ref <130)
TRIGLYCERIDES: 91 mg/dL (ref <150)
Total CHOL/HDL Ratio: 3.8 (calc) (ref <5.0)

## 2017-12-01 LAB — BASIC METABOLIC PANEL WITH GFR
BUN / CREAT RATIO: 13 (calc) (ref 6–22)
BUN: 18 mg/dL (ref 7–25)
CO2: 29 mmol/L (ref 20–32)
Calcium: 9.4 mg/dL (ref 8.6–10.3)
Chloride: 103 mmol/L (ref 98–110)
Creat: 1.35 mg/dL — ABNORMAL HIGH (ref 0.70–1.25)
GFR, EST AFRICAN AMERICAN: 63 mL/min/{1.73_m2} (ref >=60)
GFR, EST NON AFRICAN AMERICAN: 54 mL/min/{1.73_m2} — AB (ref >=60)
Glucose, Bld: 113 mg/dL — ABNORMAL HIGH (ref 65–99)
POTASSIUM: 3.8 mmol/L (ref 3.5–5.3)
SODIUM: 140 mmol/L (ref 135–146)

## 2017-12-01 LAB — HEPATIC FUNCTION PANEL
AG Ratio: 1.7 (calc) (ref 1.0–2.5)
ALBUMIN MSPROF: 4.3 g/dL (ref 3.6–5.1)
ALKALINE PHOSPHATASE (APISO): 56 U/L (ref 40–115)
ALT: 16 U/L (ref 9–46)
AST: 18 U/L (ref 10–35)
BILIRUBIN DIRECT: 0.2 mg/dL (ref 0.0–0.2)
BILIRUBIN TOTAL: 1.2 mg/dL (ref 0.2–1.2)
Globulin: 2.5 g/dL (calc) (ref 1.9–3.7)
Indirect Bilirubin: 1 mg/dL (calc) (ref 0.2–1.2)
Total Protein: 6.8 g/dL (ref 6.1–8.1)

## 2017-12-01 LAB — INSULIN, RANDOM: INSULIN: 10.5 u[IU]/mL (ref 2.0–19.6)

## 2017-12-01 LAB — MAGNESIUM: Magnesium: 2.1 mg/dL (ref 1.5–2.5)

## 2017-12-01 LAB — CBC WITH DIFFERENTIAL/PLATELET
BASOS PCT: 0.4 %
Basophils Absolute: 27 cells/uL (ref 0–200)
EOS PCT: 2.8 %
Eosinophils Absolute: 188 cells/uL (ref 15–500)
HCT: 46.2 % (ref 38.5–50.0)
HEMOGLOBIN: 16.2 g/dL (ref 13.2–17.1)
Lymphs Abs: 1213 cells/uL (ref 850–3900)
MCH: 31.4 pg (ref 27.0–33.0)
MCHC: 35.1 g/dL (ref 32.0–36.0)
MCV: 89.5 fL (ref 80.0–100.0)
MPV: 10.4 fL (ref 7.5–12.5)
Monocytes Relative: 9.1 %
NEUTROS ABS: 4663 {cells}/uL (ref 1500–7800)
Neutrophils Relative %: 69.6 %
Platelets: 198 10*3/uL (ref 140–400)
RBC: 5.16 10*6/uL (ref 4.20–5.80)
RDW: 12.3 % (ref 11.0–15.0)
Total Lymphocyte: 18.1 %
WBC mixed population: 610 cells/uL (ref 200–950)
WBC: 6.7 10*3/uL (ref 3.8–10.8)

## 2017-12-01 LAB — HEMOGLOBIN A1C
HEMOGLOBIN A1C: 5.8 %{Hb} — AB (ref <5.7)
MEAN PLASMA GLUCOSE: 120 (calc)
eAG (mmol/L): 6.6 (calc)

## 2017-12-01 LAB — TSH: TSH: 1.26 m[IU]/L (ref 0.40–4.50)

## 2017-12-01 LAB — VITAMIN D 25 HYDROXY (VIT D DEFICIENCY, FRACTURES): Vit D, 25-Hydroxy: 95 ng/mL (ref 30–100)

## 2017-12-28 DIAGNOSIS — R7303 Prediabetes: Secondary | ICD-10-CM | POA: Diagnosis not present

## 2017-12-28 DIAGNOSIS — E782 Mixed hyperlipidemia: Secondary | ICD-10-CM | POA: Diagnosis not present

## 2017-12-28 DIAGNOSIS — E559 Vitamin D deficiency, unspecified: Secondary | ICD-10-CM | POA: Diagnosis not present

## 2017-12-28 DIAGNOSIS — I10 Essential (primary) hypertension: Secondary | ICD-10-CM | POA: Diagnosis not present

## 2017-12-28 DIAGNOSIS — E349 Endocrine disorder, unspecified: Secondary | ICD-10-CM | POA: Diagnosis not present

## 2017-12-28 DIAGNOSIS — Z79899 Other long term (current) drug therapy: Secondary | ICD-10-CM | POA: Diagnosis not present

## 2018-01-19 ENCOUNTER — Ambulatory Visit: Payer: BLUE CROSS/BLUE SHIELD | Admitting: Internal Medicine

## 2018-01-19 ENCOUNTER — Other Ambulatory Visit: Payer: Self-pay | Admitting: *Deleted

## 2018-01-19 VITALS — BP 120/84 | HR 72 | Temp 97.8°F | Resp 18 | Ht 70.0 in | Wt 241.6 lb

## 2018-01-19 DIAGNOSIS — N342 Other urethritis: Secondary | ICD-10-CM | POA: Diagnosis not present

## 2018-01-19 DIAGNOSIS — H60501 Unspecified acute noninfective otitis externa, right ear: Secondary | ICD-10-CM | POA: Diagnosis not present

## 2018-01-19 MED ORDER — FLUTICASONE PROPIONATE 50 MCG/ACT NA SUSP: 2.0000 | Freq: Every day | NASAL | 3 refills | Status: DC

## 2018-01-19 MED ORDER — NEOMYCIN-POLYMYXIN-HC 3.5-10000-1 OT SOLN: OTIC | 1 refills | Status: DC

## 2018-01-19 MED ORDER — DOXYCYCLINE HYCLATE 100 MG PO CAPS: ORAL_CAPSULE | ORAL | 0 refills | Status: DC

## 2018-01-19 NOTE — Progress Notes (Signed)
  Subjective:    Patient ID: Alex Lewis E Larimer, male    DOB: 12/17/1950, 67 y.o.   MRN: [id number]  HPI This nice 67 yo MWM showed up this am with c/o Rt ear discomfort & decreased hearing. Has tried "drops" at home. He also report occasional urinary burning.   sMedication Sig  . aspirin EC 81 MG tablet Take 81 mg by mouth daily.  . B Complex-C (SUPER B COMPLEX PO) Take 1 tablet by mouth daily.  . bisoprolol-hydrochlorothiazide (ZIAC) 5-6.25 MG tablet TAKE 1 TABLET DAILY FOR BLOOD PRESSURE  . Cholecalciferol (VITAMIN D-3) 5000 UNITS TABS Take 10,000 Units by mouth daily.  Marland Kitchen. loratadine (CLARITIN) 10 MG tablet Take 10 mg by mouth daily.  . ranitidine (ZANTAC) 150 MG tablet Take 1 tablet (150 mg total) by mouth 2 (two) times daily.  . fluticasone (FLONASE) 50 MCG/ACT nasal spray Place 2 sprays into both nostrils daily. (Patient taking differently: Place 2 sprays into both nostrils as needed. )  . azithromycin (ZITHROMAX) 250 MG tablet Take 2 tablets (500 mg) on  Day 1,  followed by 1 tablet (250 mg) once daily on Days 2 through 5.  . predniSONE (DELTASONE) 20 MG tablet 1 tab 3 x day for 3 days, then 1 tab 2 x day for 3 days, then 1 tab 1 x day for 5 days  . promethazine-dextromethorphan (PROMETHAZINE-DM) 6.25-15 MG/5ML syrup Take 1 to 2 tsp enery 4 hours if needed for cough   No facility-administered medications prior to visit.    Allergies  Allergen Reactions  . Penicillins     REACTION: unknown   Past Medical History:  Diagnosis Date  . Allergic rhinitis   . BPH (benign prostatic hyperplasia)   . GERD (gastroesophageal reflux disease)   . Hyperlipidemia   . Hypertension   . Hypogonadism, male   . Kidney stones   . Morbid obesity (HCC)   . Pre-diabetes   . Vitamin D deficiency    Review of Systems  10 point systems review negative except as above.   Objective:   Physical Exam  BP 120/84   Pulse 72   Temp 97.8 F (36.6 C)   Resp 18   Ht 5\' 10"  (1.778 m)   Wt 241 lb 9.6 oz  (109.6 kg)   BMI 34.67 kg/m   HEENT - Lt EAC & TM - Nl. Rt EAC inflammed 2-3 (+) and irrigated of  "goopy"detritis. N/O/P - clear. Neck - supple.  Chest - Clear equal BS. Cor - Nl HS. RRR w/o sig MGR. PP 1(+). No edema. MS- FROM w/o deformities.  Gait Nl. Neuro -  Nl w/o focal abnormalities.   Assessment & Plan:   1. Acute otitis externa of right ear, unspecified type  - neomycin-polymyxin-hydrocortisone (CORTISPORIN) OTIC solution; 5 to 6 drops to the affected ear 3 to 4 x / day  Dispense: 10 mL; Refill: 1  - doxycycline (VIBRAMYCIN) 100 MG capsule; Take 1 capsule 2 x / day for 5 days and then 1 x / day for 10 days. Take with FOOD  Dispense: 20 capsule; Refill: 0  2. Urethritis  - doxycycline (VIBRAMYCIN) 100 MG capsule; Take 1 capsule 2 x / day for 5 days and then 1 x / day for 10 days. Take with FOOD  Dispense: 20 capsule; Refill: 0

## 2018-03-09 NOTE — Progress Notes (Signed)
FOLLOW UP  Assessment and Plan:   Hypertension Well controlled with current medications  Monitor blood pressure at home; patient to call if consistently greater than 130/80 Continue DASH diet.   Reminder to go to the ER if any CP, SOB, nausea, dizziness, severe HA, changes vision/speech, left arm numbness and tingling and jaw pain.  Cholesterol Continue medication  Continue low cholesterol diet and exercise.  Check lipid panel.   Prediabetes Continue diet and exercise.  Perform daily foot/skin check, notify office of any concerning changes.  Check A1C  Obesity with co morbidities Long discussion about weight loss, diet, and exercise Discussed ideal weight for height and initial weight goal (240lb)  Patient will work on portion control, eating dinner earlier, making small good decisions daily Will follow up in 3 months  Vitamin D Def/ osteoporosis prevention At goal at recent check; continue to recommend supplementation for goal of 70-100 Defer vitamin D level  GERD Well managed by lifestyle and ranitidine Discussed diet, avoiding triggers and other lifestyle changes - eat dinner earlier  Depression/anxiety Doing well off of medications Lifestyle discussed: diet/exerise, sleep hygiene, stress management, hydration   Continue diet and meds as discussed. Further disposition pending results of labs. Discussed med's effects and SE's.   Over 30 minutes of exam, counseling, chart review, and critical decision making was performed.   Future Appointments  Date Time Provider Department Center  06/20/2018 10:00 AM [name], MD [id number] None    ----------------------------------------------------------------------------------------------------------------------  HPI 67 y.o. male  presents for 3 month follow up on hypertension, cholesterol, prediabetes, obesity, depression, GERD and vitamin D deficiency.   he has a diagnosis of depression and was prescribed celexa 20  mg - he reports he never took, reports symptoms are well controlled, and stress levels have improved.   BMI is Body mass index is 35.3 kg/m., he has been working on diet - eating sandwiches rather than fast food, increasing water, walking as much as possible. Wt Readings from Last 3 Encounters:  03/10/18 246 lb (111.6 kg)  01/19/18 241 lb 9.6 oz (109.6 kg)  11/30/17 240 lb 3.2 oz (109 kg)   he has a diagnosis of GERD which is currently managed by ranitidine 150 mg he reports symptoms is currently well controlled, and denies breakthrough reflux, burning in chest, hoarseness or cough.    His blood pressure has been controlled at home, today their BP is BP: 130/74  He does not workout. He denies chest pain, shortness of breath, dizziness.   He is on cholesterol medication and denies myalgias. His cholesterol is at goal. The cholesterol last visit was:   Lab Results  Component Value Date   CHOL 146 11/30/2017   HDL 38 (L) 11/30/2017   LDLCALC 89 11/30/2017   TRIG 91 11/30/2017   CHOLHDL 3.8 11/30/2017    He has been working on diet for prediabetes, and denies increased appetite, nausea, paresthesia of the feet, polydipsia, polyuria, visual disturbances and vomiting. Last A1C in the office was:  Lab Results  Component Value Date   HGBA1C 5.8 (H) 11/30/2017   Patient is on Vitamin D supplement and at goal:   Lab Results  Component Value Date   VD25OH 95 11/30/2017      Current Medications:  Current Outpatient Medications on File Prior to Visit  Medication Sig  . aspirin EC 81 MG tablet Take 81 mg by mouth daily.  . B Complex-C (SUPER B COMPLEX PO) Take 1 tablet by mouth daily.  . bisoprolol-hydrochlorothiazide (  ZIAC) 5-6.25 MG tablet TAKE 1 TABLET DAILY FOR BLOOD PRESSURE  . Cholecalciferol (VITAMIN D-3) 5000 UNITS TABS Take 10,000 Units by mouth daily.  . fluticasone (FLONASE) 50 MCG/ACT nasal spray Place 2 sprays into both nostrils daily.  Marland Kitchen. loratadine (CLARITIN) 10 MG tablet  Take 10 mg by mouth daily.  Marland Kitchen. neomycin-polymyxin-hydrocortisone (CORTISPORIN) OTIC solution 5 to 6 drops to the affected ear 3 to 4 x / day  . ranitidine (ZANTAC) 150 MG tablet Take 1 tablet (150 mg total) by mouth 2 (two) times daily.  Marland Kitchen. doxycycline (VIBRAMYCIN) 100 MG capsule Take 1 capsule 2 x / day for 5 days and then 1 x / day for 10 days. Take with FOOD   No current facility-administered medications on file prior to visit.      Allergies:  Allergies  Allergen Reactions  . Penicillins     REACTION: unknown     Medical History:  Past Medical History:  Diagnosis Date  . Allergic rhinitis   . BPH (benign prostatic hyperplasia)   . GERD (gastroesophageal reflux disease)   . Hyperlipidemia   . Hypertension   . Hypogonadism, male   . Kidney stones   . Morbid obesity (HCC)   . Pre-diabetes   . Vitamin D deficiency    Family history- Reviewed and unchanged Social history- Reviewed and unchanged   Review of Systems:  Review of Systems  Constitutional: Negative for malaise/fatigue and weight loss.  HENT: Negative for hearing loss and tinnitus.   Eyes: Negative for blurred vision and double vision.  Respiratory: Negative for cough, shortness of breath and wheezing.   Cardiovascular: Negative for chest pain, palpitations, orthopnea, claudication and leg swelling.  Gastrointestinal: Negative for abdominal pain, blood in stool, constipation, diarrhea, heartburn, melena, nausea and vomiting.  Genitourinary: Negative.   Musculoskeletal: Negative for joint pain and myalgias.  Skin: Negative for rash.  Neurological: Negative for dizziness, tingling, sensory change, weakness and headaches.  Endo/Heme/Allergies: Negative for polydipsia.  Psychiatric/Behavioral: Negative.   All other systems reviewed and are negative.   Physical Exam: BP 130/74   Pulse 60   Temp 97.7 F (36.5 C)   Ht 5\' 10"  (1.778 m)   Wt 246 lb (111.6 kg)   SpO2 96%   BMI 35.30 kg/m  Wt Readings from  Last 3 Encounters:  03/10/18 246 lb (111.6 kg)  01/19/18 241 lb 9.6 oz (109.6 kg)  11/30/17 240 lb 3.2 oz (109 kg)   General Appearance: Well nourished, in no apparent distress. Eyes: PERRLA, EOMs, conjunctiva no swelling or erythema Sinuses: No Frontal/maxillary tenderness ENT/Mouth: Ext aud canals clear, TMs without erythema, bulging. No erythema, swelling, or exudate on post pharynx.  Tonsils not swollen or erythematous. Hearing normal.  Neck: Supple, thyroid normal.  Respiratory: Respiratory effort normal, BS equal bilaterally without rales, rhonchi, wheezing or stridor.  Cardio: RRR with no MRGs. Brisk peripheral pulses without edema.  Abdomen: Soft, + BS.  Non tender, no guarding, rebound, hernias, masses. Lymphatics: Non tender without lymphadenopathy.  Musculoskeletal: Full ROM, 5/5 strength, Normal gait Skin: Warm, dry without rashes, lesions, ecchymosis.  Neuro: Cranial nerves intact. No cerebellar symptoms.  Psych: Awake and oriented X 3, normal affect, Insight and Judgment appropriate.  PHQ-2: 0   Dan MakerAshley C Caylan Chenard, NP 9:22 AM [facility]

## 2018-03-10 ENCOUNTER — Ambulatory Visit: Payer: BLUE CROSS/BLUE SHIELD | Admitting: Adult Health

## 2018-03-10 ENCOUNTER — Encounter: Payer: Self-pay | Admitting: Adult Health

## 2018-03-10 VITALS — BP 130/74 | HR 60 | Temp 97.7°F | Ht 70.0 in | Wt 246.0 lb

## 2018-03-10 DIAGNOSIS — K219 Gastro-esophageal reflux disease without esophagitis: Secondary | ICD-10-CM | POA: Diagnosis not present

## 2018-03-10 DIAGNOSIS — F329 Major depressive disorder, single episode, unspecified: Secondary | ICD-10-CM | POA: Diagnosis not present

## 2018-03-10 DIAGNOSIS — R7303 Prediabetes: Secondary | ICD-10-CM | POA: Diagnosis not present

## 2018-03-10 DIAGNOSIS — E559 Vitamin D deficiency, unspecified: Secondary | ICD-10-CM

## 2018-03-10 DIAGNOSIS — Z79899 Other long term (current) drug therapy: Secondary | ICD-10-CM | POA: Diagnosis not present

## 2018-03-10 DIAGNOSIS — E669 Obesity, unspecified: Secondary | ICD-10-CM

## 2018-03-10 DIAGNOSIS — I10 Essential (primary) hypertension: Secondary | ICD-10-CM | POA: Diagnosis not present

## 2018-03-10 DIAGNOSIS — E782 Mixed hyperlipidemia: Secondary | ICD-10-CM

## 2018-03-10 DIAGNOSIS — F32A Depression, unspecified: Secondary | ICD-10-CM

## 2018-03-10 NOTE — Patient Instructions (Signed)
Aim for 7+ servings of fruits and vegetables daily  80+ fluid ounces of water or unsweet tea for healthy kidneys  Limit alcohol intake, avoid smoking  Limit animal fats in diet for cholesterol and heart health - choose grass fed whenever available  Aim for low stress - take time to unwind and care for your mental health  Aim for 150 min of moderate intensity exercise weekly for heart health, and weights twice weekly for bone health  Aim for 7-9 hours of sleep daily      When it comes to diets, agreement about the perfect plan isn't easy to find, even among the experts. Experts at the [employer] developed an idea known as the Healthy Eating Plate. Just imagine a plate divided into logical, healthy portions.  The emphasis is on diet quality:  Load up on vegetables and fruits - one-half of your plate: Aim for color and variety, and remember that potatoes don't count.  Go for whole grains - one-quarter of your plate: Whole wheat, barley, wheat berries, quinoa, oats, brown rice, and foods made with them. If you want pasta, go with whole wheat pasta.  Protein power - one-quarter of your plate: Fish, chicken, beans, and nuts are all healthy, versatile protein sources. Limit red meat.  The diet, however, does go beyond the plate, offering a few other suggestions.  Use healthy plant oils, such as olive, canola, soy, corn, sunflower and peanut. Check the labels, and avoid partially hydrogenated oil, which have unhealthy trans fats.  If you're thirsty, drink water. Coffee and tea are good in moderation, but skip sugary drinks and limit milk and dairy products to one or two daily servings.  The type of carbohydrate in the diet is more important than the amount. Some sources of carbohydrates, such as vegetables, fruits, whole grains, and beans-are healthier than others.  Finally, stay active.   

## 2018-03-11 LAB — CBC WITH DIFFERENTIAL/PLATELET
BASOS ABS: 19 {cells}/uL (ref 0–200)
Basophils Relative: 0.3 %
Eosinophils Absolute: 143 cells/uL (ref 15–500)
Eosinophils Relative: 2.3 %
HEMATOCRIT: 45.5 % (ref 38.5–50.0)
Hemoglobin: 15.9 g/dL (ref 13.2–17.1)
LYMPHS ABS: 1736 {cells}/uL (ref 850–3900)
MCH: 31.4 pg (ref 27.0–33.0)
MCHC: 34.9 g/dL (ref 32.0–36.0)
MCV: 89.9 fL (ref 80.0–100.0)
MPV: 10.5 fL (ref 7.5–12.5)
Monocytes Relative: 9 %
NEUTROS PCT: 60.4 %
Neutro Abs: 3745 cells/uL (ref 1500–7800)
PLATELETS: 229 10*3/uL (ref 140–400)
RBC: 5.06 10*6/uL (ref 4.20–5.80)
RDW: 12.1 % (ref 11.0–15.0)
TOTAL LYMPHOCYTE: 28 %
WBC: 6.2 10*3/uL (ref 3.8–10.8)
WBCMIX: 558 {cells}/uL (ref 200–950)

## 2018-03-11 LAB — COMPLETE METABOLIC PANEL WITH GFR
AG RATIO: 2.1 (calc) (ref 1.0–2.5)
ALKALINE PHOSPHATASE (APISO): 62 U/L (ref 40–115)
ALT: 16 U/L (ref 9–46)
AST: 16 U/L (ref 10–35)
Albumin: 4.4 g/dL (ref 3.6–5.1)
BILIRUBIN TOTAL: 0.7 mg/dL (ref 0.2–1.2)
BUN/Creatinine Ratio: 12 (calc) (ref 6–22)
BUN: 15 mg/dL (ref 7–25)
CHLORIDE: 106 mmol/L (ref 98–110)
CO2: 29 mmol/L (ref 20–32)
Calcium: 9.6 mg/dL (ref 8.6–10.3)
Creat: 1.3 mg/dL — ABNORMAL HIGH (ref 0.70–1.25)
GFR, EST NON AFRICAN AMERICAN: 57 mL/min/{1.73_m2} — AB (ref >=60)
GFR, Est African American: 66 mL/min/{1.73_m2} (ref >=60)
Globulin: 2.1 g/dL (calc) (ref 1.9–3.7)
Glucose, Bld: 116 mg/dL — ABNORMAL HIGH (ref 65–99)
POTASSIUM: 4.4 mmol/L (ref 3.5–5.3)
Sodium: 141 mmol/L (ref 135–146)
Total Protein: 6.5 g/dL (ref 6.1–8.1)

## 2018-03-11 LAB — HEMOGLOBIN A1C
Hgb A1c MFr Bld: 5.7 % of total Hgb — ABNORMAL HIGH (ref <5.7)
Mean Plasma Glucose: 117 (calc)
eAG (mmol/L): 6.5 (calc)

## 2018-03-11 LAB — LIPID PANEL
CHOLESTEROL: 142 mg/dL (ref <200)
HDL: 46 mg/dL (ref >40)
LDL Cholesterol (Calc): 83 mg/dL (calc)
NON-HDL CHOLESTEROL (CALC): 96 mg/dL (ref <130)
TRIGLYCERIDES: 57 mg/dL (ref <150)
Total CHOL/HDL Ratio: 3.1 (calc) (ref <5.0)

## 2018-03-11 LAB — TSH: TSH: 1.7 mIU/L (ref 0.40–4.50)

## 2018-04-02 DIAGNOSIS — H60391 Other infective otitis externa, right ear: Secondary | ICD-10-CM | POA: Diagnosis not present

## 2018-05-08 ENCOUNTER — Other Ambulatory Visit: Payer: Self-pay | Admitting: Internal Medicine

## 2018-05-08 DIAGNOSIS — I10 Essential (primary) hypertension: Secondary | ICD-10-CM

## 2018-06-20 ENCOUNTER — Encounter: Payer: Self-pay | Admitting: Internal Medicine

## 2018-06-20 ENCOUNTER — Ambulatory Visit: Payer: BLUE CROSS/BLUE SHIELD | Admitting: Internal Medicine

## 2018-06-20 VITALS — BP 124/72 | HR 56 | Temp 97.3°F | Resp 18 | Ht 70.0 in | Wt 237.8 lb

## 2018-06-20 DIAGNOSIS — Z0001 Encounter for general adult medical examination with abnormal findings: Secondary | ICD-10-CM

## 2018-06-20 DIAGNOSIS — E559 Vitamin D deficiency, unspecified: Secondary | ICD-10-CM

## 2018-06-20 DIAGNOSIS — K219 Gastro-esophageal reflux disease without esophagitis: Secondary | ICD-10-CM

## 2018-06-20 DIAGNOSIS — Z125 Encounter for screening for malignant neoplasm of prostate: Secondary | ICD-10-CM | POA: Diagnosis not present

## 2018-06-20 DIAGNOSIS — H6521 Chronic serous otitis media, right ear: Secondary | ICD-10-CM

## 2018-06-20 DIAGNOSIS — Z23 Encounter for immunization: Secondary | ICD-10-CM

## 2018-06-20 DIAGNOSIS — Z1329 Encounter for screening for other suspected endocrine disorder: Secondary | ICD-10-CM | POA: Diagnosis not present

## 2018-06-20 DIAGNOSIS — I10 Essential (primary) hypertension: Secondary | ICD-10-CM

## 2018-06-20 DIAGNOSIS — Z131 Encounter for screening for diabetes mellitus: Secondary | ICD-10-CM | POA: Diagnosis not present

## 2018-06-20 DIAGNOSIS — Z136 Encounter for screening for cardiovascular disorders: Secondary | ICD-10-CM | POA: Diagnosis not present

## 2018-06-20 DIAGNOSIS — Z1389 Encounter for screening for other disorder: Secondary | ICD-10-CM

## 2018-06-20 DIAGNOSIS — R350 Frequency of micturition: Secondary | ICD-10-CM | POA: Diagnosis not present

## 2018-06-20 DIAGNOSIS — N401 Enlarged prostate with lower urinary tract symptoms: Secondary | ICD-10-CM

## 2018-06-20 DIAGNOSIS — N138 Other obstructive and reflux uropathy: Secondary | ICD-10-CM

## 2018-06-20 DIAGNOSIS — Z87891 Personal history of nicotine dependence: Secondary | ICD-10-CM

## 2018-06-20 DIAGNOSIS — Z13220 Encounter for screening for lipoid disorders: Secondary | ICD-10-CM | POA: Diagnosis not present

## 2018-06-20 DIAGNOSIS — Z130 Encounter for screening for diseases of the blood and blood-forming organs and certain disorders involving the immune mechanism: Secondary | ICD-10-CM | POA: Diagnosis not present

## 2018-06-20 DIAGNOSIS — R7303 Prediabetes: Secondary | ICD-10-CM

## 2018-06-20 DIAGNOSIS — E782 Mixed hyperlipidemia: Secondary | ICD-10-CM

## 2018-06-20 DIAGNOSIS — Z0000 Encounter for general adult medical examination without abnormal findings: Secondary | ICD-10-CM

## 2018-06-20 DIAGNOSIS — R7309 Other abnormal glucose: Secondary | ICD-10-CM

## 2018-06-20 DIAGNOSIS — Z1212 Encounter for screening for malignant neoplasm of rectum: Secondary | ICD-10-CM

## 2018-06-20 DIAGNOSIS — Z1211 Encounter for screening for malignant neoplasm of colon: Secondary | ICD-10-CM

## 2018-06-20 DIAGNOSIS — Z8249 Family history of ischemic heart disease and other diseases of the circulatory system: Secondary | ICD-10-CM

## 2018-06-20 DIAGNOSIS — R5383 Other fatigue: Secondary | ICD-10-CM

## 2018-06-20 DIAGNOSIS — Z79899 Other long term (current) drug therapy: Secondary | ICD-10-CM | POA: Diagnosis not present

## 2018-06-20 MED ORDER — TOPIRAMATE 50 MG PO TABS: ORAL_TABLET | ORAL | 1 refills | Status: DC

## 2018-06-20 NOTE — Progress Notes (Signed)
[place] [facility]   [name], M.D.     Dyanne Carrel. Steffanie Dunn, P.A.-C Judd Gaudier, DNP [facility]                [address]                [place], [place]. [address] Telephone [phone] Telefax [phone] Annual  Screening/Preventative Visit  & Comprehensive Evaluation & Examination     This very nice 67 y.o. MWM presents for a Screening /Preventative Visit & comprehensive evaluation and management of multiple medical co-morbidities.  Patient has been followed for HTN, HLD, Prediabetes and Vitamin D Deficiency. Patient's Genella Rife is controlled on his meds      HTN predates since 2001. Patient's BP has been controlled at home.  Today's BP is at goal - 124/72. Patient denies any cardiac symptoms as chest pain, palpitations, shortness of breath, dizziness or ankle swelling.     Patient's hyperlipidemia is controlled with diet and medications. Patient denies myalgias or other medication SE's. Last lipids were at goal:  Lab Results  Component Value Date   CHOL 142 03/10/2018   HDL 46 03/10/2018   LDLCALC 83 03/10/2018   TRIG 57 03/10/2018   CHOLHDL 3.1 03/10/2018      Patient has hx/o  Morbid Obesity (BMI 34+) and prediabetes  (A1c 6.2%/2011) and patient denies reactive hypoglycemic symptoms, visual blurring, diabetic polys or paresthesias. Last A1c was near goal:  Lab Results  Component Value Date   HGBA1C 5.7 (H) 03/10/2018       Finally, patient has history of Vitamin D Deficiency  ("16"/2008)  and last vitamin D was at goal: Lab Results  Component Value Date   VD25OH 95 11/30/2017   Current Outpatient Medications on File Prior to Visit  Medication Sig  . aspirin EC 81 MG tablet Take 81 mg by mouth daily.  . B Complex-C (SUPER B COMPLEX PO) Take 1 tablet by mouth daily.  . bisoprolol-hydrochlorothiazide (ZIAC) 5-6.25 MG tablet TAKE 1 TABLET DAILY FOR BLOOD PRESSURE  . Cholecalciferol (VITAMIN D-3) 5000 UNITS  TABS Take 10,000 Units by mouth daily.  . fluticasone (FLONASE) 50 MCG/ACT nasal spray Place 2 sprays into both nostrils daily.  Marland Kitchen loratadine (CLARITIN) 10 MG tablet Take 10 mg by mouth daily.  . ranitidine (ZANTAC) 150 MG tablet Take 1 tablet (150 mg total) by mouth 2 (two) times daily.   No current facility-administered medications on file prior to visit.    Allergies  Allergen Reactions  . Penicillins     REACTION: unknown   Past Medical History:  Diagnosis Date  . Allergic rhinitis   . BPH (benign prostatic hyperplasia)   . GERD (gastroesophageal reflux disease)   . Hyperlipidemia   . Hypertension   . Hypogonadism, male   . Kidney stones   . Morbid obesity (HCC)   . Pre-diabetes   . Vitamin D deficiency    Health Maintenance  Topic Date Due  . Hepatitis C Screening  11-Apr-1951  . COLONOSCOPY  02/15/2018  . INFLUENZA VACCINE  04/27/2018  . PNA vac Low Risk Adult (2 of 2 - PPSV23) 05/13/2018  . TETANUS/TDAP  09/06/2024   Immunization History  Administered Date(s) Administered  . DTaP 01/26/2000  . Influenza Whole 06/22/2013  . Influenza, High Dose Seasonal PF 06/27/2015, 06/27/2017  . Influenza-Unspecified 07/13/2014, 06/26/2016  . PPD Test 05/29/2014, 07/30/2015  . Pneumococcal Conjugate-13 05/13/2017  . Tdap 09/06/2014  . Zoster 04/22/2013  Last Colon - 02/16/2008 - Dr Marina Goodell - 10 yr f/u due.   Past Surgical History:  Procedure Laterality Date  . FLEXIBLE SIGMOIDOSCOPY  1992  . KNEE SURGERY Right 1977  . TONSILLECTOMY AND ADENOIDECTOMY  1960   Family History  Problem Relation Age of Onset  . Cancer Father        lung  . Diabetes Father   . Diabetes Brother    Social History   Socioeconomic History  . Marital status: Married    Spouse name: Not on file  . Number of children: Not on file  . Years of education: Not on file  . Highest education level: Not on file  Occupational History  . Not on file  Tobacco Use  . Smoking status: Former Smoker     Last attempt to quit: 09/28/1975    Years since quitting: 42.7  . Smokeless tobacco: Never Used  Substance and Sexual Activity  . Alcohol use: Yes    Comment: occasional  . Drug use: No  . Sexual activity: Not on file    ROS Constitutional: Denies fever, chills, weight loss/gain, headaches, insomnia,  night sweats or change in appetite. Does c/o fatigue. Eyes: Denies redness, blurred vision, diplopia, discharge, itchy or watery eyes.  ENT: Denies discharge, congestion, post nasal drip, epistaxis, sore throat, earache, hearing loss, dental pain, Tinnitus, Vertigo, Sinus pain or snoring.  Cardio: Denies chest pain, palpitations, irregular heartbeat, syncope, dyspnea, diaphoresis, orthopnea, PND, claudication or edema Respiratory: denies cough, dyspnea, DOE, pleurisy, hoarseness, laryngitis or wheezing.  Gastrointestinal: Denies dysphagia, heartburn, reflux, water brash, pain, cramps, nausea, vomiting, bloating, diarrhea, constipation, hematemesis, melena, hematochezia, jaundice or hemorrhoids Genitourinary: Denies dysuria, frequency, urgency, nocturia, hesitancy, discharge, hematuria or flank pain Musculoskeletal: Denies arthralgia, myalgia, stiffness, Jt. Swelling, pain, limp or strain/sprain. Denies Falls. Skin: Denies puritis, rash, hives, warts, acne, eczema or change in skin lesion Neuro: No weakness, tremor, incoordination, spasms, paresthesia or pain Psychiatric: Denies confusion, memory loss or sensory loss. Denies Depression. Endocrine: Denies change in weight, skin, hair change, nocturia, and paresthesia, diabetic polys, visual blurring or hyper / hypo glycemic episodes.  Heme/Lymph: No excessive bleeding, bruising or enlarged lymph nodes.  Physical Exam  BP 124/72   Pulse (!) 56   Temp (!) 97.3 F (36.3 C)   Resp 18   Ht 5\' 10"  (1.778 m)   Wt 237 lb 12.8 oz (107.9 kg)   BMI 34.12 kg/m   General Appearance: Over  nourished and well groomed and in no apparent  distress.  Eyes: PERRLA, EOMs, conjunctiva no swelling or erythema, normal fundi and vessels. Sinuses: No frontal/maxillary tenderness ENT/Mouth: EACs patent / TMs  nl. Nares clear without erythema, swelling, mucoid exudates. Oral hygiene is good. No erythema, swelling, or exudate. Tongue normal, non-obstructing. Tonsils not swollen or erythematous. Hearing normal.  Neck: Supple, thyroid not palpable. No bruits, nodes or JVD. Respiratory: Respiratory effort normal.  BS equal and clear bilateral without rales, rhonci, wheezing or stridor. Cardio: Heart sounds are normal with regular rate and rhythm and no murmurs, rubs or gallops. Peripheral pulses are normal and equal bilaterally without edema. No aortic or femoral bruits. Chest: symmetric with normal excursions and percussion.  Abdomen: Soft, with Nl bowel sounds. Nontender, no guarding, rebound, hernias, masses, or organomegaly.  Lymphatics: Non tender without lymphadenopathy.  Genitourinary: No hernias.Testes nl. DRE - prostate nl for age - smooth & firm w/o nodules. Musculoskeletal: Full ROM all peripheral extremities, joint stability, 5/5 strength, and normal gait. Skin: Warm  and dry without rashes, lesions, cyanosis, clubbing or  ecchymosis.  Neuro: Cranial nerves intact, reflexes equal bilaterally. Normal muscle tone, no cerebellar symptoms. Sensation intact.  Pysch: Alert and oriented X 3 with normal affect, insight and judgment appropriate.   Assessment and Plan  1. Annual Preventative/Screening Exam   1. Encounter for general adult medical examination with abnormal findings   2. Essential hypertension  - EKG 12-Lead - US, RETROPERITNL ABD,  LTD - Urinalysis, Routine w reflex microscopic - Microalbumin / creatinine urine ratio - CBC with Differential/Platelet - COMPLETE METABOLIC PANEL WITH GFR - Magnesium - TSH  3. Hyperlipidemia, mixed  - EKG 12-Lead - US, RETROPERITNL ABD,  LTD - Lipid panel - TSH  4. Abnormal  glucose  - EKG 12-Lead - US, RETROPERITNL ABD,  LTD - Hemoglobin A1c - Insulin, random  5. Vitamin D deficiency  - VITAMIN D 25 Hydroxl  6. Prediabetes  - EKG 12-Lead - US, RETROPERITNL ABD,  LTD - Hemoglobin A1c - Insulin, random  7. Gastroesophageal reflux disease  - CBC with Differential/Platelet  8. Screening for colorectal cancer  - POC Hemoccult Bld/Stl (3-Cd Home Screen); Future  - Ambulatory referral to Gastroenterology  9. BPH with obstruction/lower urinary tract symptoms  - PSA  10. Screening for prostate cancer  - PSA  11. Screening for ischemic heart disease  - EKG 12-Lead  12. Former smoker  - EKG 12-Lead - US, RETROPERITNL ABD,  LTD  13. FH: hypertension  - EKG 12-Lead - US, RETROPERITNL ABD,  LTD  14. Screening for AAA (aortic abdominal aneurysm)  - US, RETROPERITNL ABD,  LTD  15. Fatigue  - Iron,Total/Total Iron Binding Cap - Vitamin B12 - Testosterone - CBC with Differential/Platelet  16. Medication management  - Urinalysis, Routine w reflex microscopic - Microalbumin / creatinine urine ratio - CBC with Differential/Platelet - COMPLETE METABOLIC PANEL WITH GFR - Magnesium - Lipid panel - TSH - Hemoglobin A1c - Insulin, random - VITAMIN D 25 Hydroxyl  17. Morbid obesity (HCC)  - topiramate (TOPAMAX) 50 MG tablet; Take 1 to 2 tablets 1 hour before sleep  Dispense: 180 tablet; Refill: 1  18. Right chronic serous otitis media  - Ambulatory referral to ENT  19. Need for prophylactic vaccination and inoculation against influenza  - Flu vaccine HIGH DOSE PF (Fluzone High dose)  20. Need for prophylactic vaccination against Streptococcus pneumoniae (pneumococcus)  - Pneumococcal polysaccharide vaccine 23-valent greater than or equal to 2yo subcutaneous/IM      Patient was counseled in prudent diet, weight control to achieve/maintain BMI less than 25, BP monitoring, regular exercise and medications as discussed.   Discussed med effects and SE's. Routine screening labs and tests as requested with regular follow-up as recommended. Over 40 minutes of exam, counseling, chart review and high complex critical decision making was performed

## 2018-06-20 NOTE — Patient Instructions (Signed)

## 2018-06-21 LAB — CBC WITH DIFFERENTIAL/PLATELET
BASOS PCT: 0.5 %
Basophils Absolute: 33 cells/uL (ref 0–200)
EOS ABS: 117 {cells}/uL (ref 15–500)
EOS PCT: 1.8 %
HCT: 47.7 % (ref 38.5–50.0)
HEMOGLOBIN: 16.7 g/dL (ref 13.2–17.1)
LYMPHS ABS: 1879 {cells}/uL (ref 850–3900)
MCH: 31.6 pg (ref 27.0–33.0)
MCHC: 35 g/dL (ref 32.0–36.0)
MCV: 90.2 fL (ref 80.0–100.0)
MONOS PCT: 8.3 %
MPV: 10.4 fL (ref 7.5–12.5)
NEUTROS ABS: 3933 {cells}/uL (ref 1500–7800)
Neutrophils Relative %: 60.5 %
Platelets: 219 10*3/uL (ref 140–400)
RBC: 5.29 10*6/uL (ref 4.20–5.80)
RDW: 12.4 % (ref 11.0–15.0)
Total Lymphocyte: 28.9 %
WBC mixed population: 540 cells/uL (ref 200–950)
WBC: 6.5 10*3/uL (ref 3.8–10.8)

## 2018-06-21 LAB — VITAMIN B12: VITAMIN B 12: 592 pg/mL (ref 200–1100)

## 2018-06-21 LAB — URINALYSIS, ROUTINE W REFLEX MICROSCOPIC
Bacteria, UA: NONE SEEN /HPF
Bilirubin Urine: NEGATIVE
Glucose, UA: NEGATIVE
HYALINE CAST: NONE SEEN /LPF
Hgb urine dipstick: NEGATIVE
Ketones, ur: NEGATIVE
Leukocytes, UA: NEGATIVE
Nitrite: NEGATIVE
PH: 6.5 (ref 5.0–8.0)
PROTEIN: NEGATIVE
RBC / HPF: NONE SEEN /HPF (ref 0–2)
SPECIFIC GRAVITY, URINE: 1.01 (ref 1.001–1.03)
Squamous Epithelial / LPF: NONE SEEN /HPF (ref <=5)
WBC, UA: NONE SEEN /HPF (ref 0–5)

## 2018-06-21 LAB — COMPLETE METABOLIC PANEL WITH GFR
AG Ratio: 1.9 (calc) (ref 1.0–2.5)
ALKALINE PHOSPHATASE (APISO): 57 U/L (ref 40–115)
ALT: 21 U/L (ref 9–46)
AST: 18 U/L (ref 10–35)
Albumin: 4.3 g/dL (ref 3.6–5.1)
BILIRUBIN TOTAL: 0.9 mg/dL (ref 0.2–1.2)
BUN: 17 mg/dL (ref 7–25)
CHLORIDE: 103 mmol/L (ref 98–110)
CO2: 27 mmol/L (ref 20–32)
Calcium: 9.7 mg/dL (ref 8.6–10.3)
Creat: 1.24 mg/dL (ref 0.70–1.25)
GFR, Est African American: 69 mL/min/{1.73_m2} (ref >=60)
GFR, Est Non African American: 60 mL/min/{1.73_m2} (ref >=60)
Globulin: 2.3 g/dL (calc) (ref 1.9–3.7)
Glucose, Bld: 113 mg/dL — ABNORMAL HIGH (ref 65–99)
POTASSIUM: 4.1 mmol/L (ref 3.5–5.3)
SODIUM: 138 mmol/L (ref 135–146)
Total Protein: 6.6 g/dL (ref 6.1–8.1)

## 2018-06-21 LAB — MICROALBUMIN / CREATININE URINE RATIO
Creatinine, Urine: 60 mg/dL (ref 20–320)
MICROALB/CREAT RATIO: 3 ug/mg{creat} (ref <30)
Microalb, Ur: 0.2 mg/dL

## 2018-06-21 LAB — HEMOGLOBIN A1C
EAG (MMOL/L): 6.8 (calc)
Hgb A1c MFr Bld: 5.9 % of total Hgb — ABNORMAL HIGH (ref <5.7)
MEAN PLASMA GLUCOSE: 123 (calc)

## 2018-06-21 LAB — IRON, TOTAL/TOTAL IRON BINDING CAP
%SAT: 34 % (ref 20–48)
IRON: 116 ug/dL (ref 50–180)
TIBC: 341 mcg/dL (calc) (ref 250–425)

## 2018-06-21 LAB — LIPID PANEL
CHOLESTEROL: 167 mg/dL (ref <200)
HDL: 43 mg/dL (ref >40)
LDL Cholesterol (Calc): 103 mg/dL (calc) — ABNORMAL HIGH
Non-HDL Cholesterol (Calc): 124 mg/dL (calc) (ref <130)
Total CHOL/HDL Ratio: 3.9 (calc) (ref <5.0)
Triglycerides: 110 mg/dL (ref <150)

## 2018-06-21 LAB — TESTOSTERONE: Testosterone: 525 ng/dL (ref 250–827)

## 2018-06-21 LAB — INSULIN, RANDOM: INSULIN: 13.6 u[IU]/mL (ref 2.0–19.6)

## 2018-06-21 LAB — MAGNESIUM: MAGNESIUM: 2 mg/dL (ref 1.5–2.5)

## 2018-06-21 LAB — PSA: PSA: 0.8 ng/mL (ref <=4.0)

## 2018-06-21 LAB — TSH: TSH: 2.19 mIU/L (ref 0.40–4.50)

## 2018-06-21 LAB — VITAMIN D 25 HYDROXY (VIT D DEFICIENCY, FRACTURES): VIT D 25 HYDROXY: 90 ng/mL (ref 30–100)

## 2018-06-22 DIAGNOSIS — H60333 Swimmer's ear, bilateral: Secondary | ICD-10-CM | POA: Diagnosis not present

## 2018-06-22 DIAGNOSIS — H6123 Impacted cerumen, bilateral: Secondary | ICD-10-CM | POA: Diagnosis not present

## 2018-07-12 ENCOUNTER — Encounter: Payer: Self-pay | Admitting: Internal Medicine

## 2018-10-04 NOTE — Progress Notes (Signed)
FOLLOW UP  Assessment and Plan:   Hypertension Well controlled with current medications  Monitor blood pressure at home; patient to call if consistently greater than 130/80 Continue DASH diet.   Reminder to go to the ER if any CP, SOB, nausea, dizziness, severe HA, changes vision/speech, left arm numbness and tingling and jaw pain.  Cholesterol Controlled by lifestyle modification Continue low cholesterol diet and exercise.  Check lipid panel.   Prediabetes Continue diet and exercise.  Perform daily foot/skin check, notify office of any concerning changes.  Check A1C  Obesity with co morbidities Long discussion about weight loss, diet, and exercise He admits to poor eating over the holidays, but motivated to improve Discussed ideal weight for height and initial weight goal (230lb)  Patient will work on portion control, eating dinner earlier, making small good decisions daily Will follow up in 3 months  Vitamin D Def/ osteoporosis prevention At goal at recent check; continue to recommend supplementation for goal of 70-100 Defer vitamin D level  GERD Well managed by lifestyle, sent in famotidine to take now that ranitidine has been recalled; typically well controlled with H2 inhibitor Discussed diet, avoiding triggers and other lifestyle changes - eat dinner earlier  Depression/anxiety Doing well off of medications Lifestyle discussed: diet/exerise, sleep hygiene, stress management, hydration   Continue diet and meds as discussed. Further disposition pending results of labs. Discussed med's effects and SE's.   Over 30 minutes of exam, counseling, chart review, and critical decision making was performed.   Future Appointments  Date Time Provider Department Center  01/04/2019  9:30 AM [name], MD [facility] None  07/19/2019 10:00 AM [name], MD [facility] None     ----------------------------------------------------------------------------------------------------------------------  HPI 68 y.o. male  presents for 3 month follow up on hypertension, cholesterol, prediabetes, obesity, depression, GERD and vitamin D deficiency.    he has a diagnosis of depression and was prescribed celexa 20 mg - he reports he never took, reports symptoms are well controlled, and stress levels have improved.   BMI is Body mass index is 35.15 kg/m., he has been working on diet, he knows he splurged over the holiday but very motivated to work on this, plans to eat better and get back on elliptical.  Wt Readings from Last 3 Encounters:  10/05/18 245 lb (111.1 kg)  06/20/18 237 lb 12.8 oz (107.9 kg)  03/10/18 246 lb (111.6 kg)   he has a diagnosis of GERD which is currently managed by tums due to ranitidine recall he reports symptoms is not currently well controlled, and denies breakthrough reflux, burning in chest, hoarseness or cough.    His blood pressure has been controlled at home, today their BP is BP: 116/78  He does not workout. He denies chest pain, shortness of breath, dizziness.   He is not on cholesterol medication and denies myalgias. His cholesterol is at goal. The cholesterol last visit was:   Lab Results  Component Value Date   CHOL 167 06/20/2018   HDL 43 06/20/2018   LDLCALC 103 (H) 06/20/2018   TRIG 110 06/20/2018   CHOLHDL 3.9 06/20/2018    He has been working on diet for prediabetes, and denies increased appetite, nausea, paresthesia of the feet, polydipsia, polyuria, visual disturbances and vomiting. Last A1C in the office was:  Lab Results  Component Value Date   HGBA1C 5.9 (H) 06/20/2018   Patient is on Vitamin D supplement and at goal:   Lab Results  Component Value Date   VD25OH  90 06/20/2018      Current Medications:  Current Outpatient Medications on File Prior to Visit  Medication Sig  . aspirin EC 81 MG tablet Take 81 mg by  mouth daily.  . B Complex-C (SUPER B COMPLEX PO) Take 1 tablet by mouth daily.  . bisoprolol-hydrochlorothiazide (ZIAC) 5-6.25 MG tablet TAKE 1 TABLET DAILY FOR BLOOD PRESSURE  . Calcium Carbonate Antacid (TUMS PO) Take by mouth.  . Cholecalciferol (VITAMIN D-3) 5000 UNITS TABS Take 10,000 Units by mouth daily.  . fluticasone (FLONASE) 50 MCG/ACT nasal spray Place 2 sprays into both nostrils daily.  Marland Kitchen loratadine (CLARITIN) 10 MG tablet Take 10 mg by mouth daily.  . ranitidine (ZANTAC) 150 MG tablet Take 1 tablet (150 mg total) by mouth 2 (two) times daily.  Marland Kitchen topiramate (TOPAMAX) 50 MG tablet Take 1 to 2 tablets 1 hour before sleep (Patient not taking: Reported on 10/05/2018)   No current facility-administered medications on file prior to visit.      Allergies:  Allergies  Allergen Reactions  . Penicillins     REACTION: unknown     Medical History:  Past Medical History:  Diagnosis Date  . Allergic rhinitis   . BPH (benign prostatic hyperplasia)   . GERD (gastroesophageal reflux disease)   . Hyperlipidemia   . Hypertension   . Hypogonadism, male   . Kidney stones   . Morbid obesity (HCC)   . Pre-diabetes   . Vitamin D deficiency    Family history- Reviewed and unchanged Social history- Reviewed and unchanged   Review of Systems:  Review of Systems  Constitutional: Negative for malaise/fatigue and weight loss.  HENT: Negative for hearing loss and tinnitus.   Eyes: Negative for blurred vision and double vision.  Respiratory: Negative for cough, shortness of breath and wheezing.   Cardiovascular: Negative for chest pain, palpitations, orthopnea, claudication and leg swelling.  Gastrointestinal: Negative for abdominal pain, blood in stool, constipation, diarrhea, heartburn, melena, nausea and vomiting.  Genitourinary: Negative.   Musculoskeletal: Negative for joint pain and myalgias.  Skin: Negative for rash.  Neurological: Negative for dizziness, tingling, sensory change,  weakness and headaches.  Endo/Heme/Allergies: Negative for polydipsia.  Psychiatric/Behavioral: Negative.   All other systems reviewed and are negative.   Physical Exam: BP 116/78   Pulse 62   Temp (!) 97.5 F (36.4 C)   Ht 5\' 10"  (1.778 m)   Wt 245 lb (111.1 kg)   SpO2 96%   BMI 35.15 kg/m  Wt Readings from Last 3 Encounters:  10/05/18 245 lb (111.1 kg)  06/20/18 237 lb 12.8 oz (107.9 kg)  03/10/18 246 lb (111.6 kg)   General Appearance: Well nourished, in no apparent distress. Eyes: PERRLA, EOMs, conjunctiva no swelling or erythema Sinuses: No Frontal/maxillary tenderness ENT/Mouth: Ext aud canals clear, TMs without erythema, bulging. No erythema, swelling, or exudate on post pharynx.  Tonsils not swollen or erythematous. Hearing normal.  Neck: Supple, thyroid normal.  Respiratory: Respiratory effort normal, BS equal bilaterally without rales, rhonchi, wheezing or stridor.  Cardio: RRR with no MRGs. Brisk peripheral pulses without edema.  Abdomen: Soft, + BS.  Non tender, no guarding, rebound, hernias, masses. Lymphatics: Non tender without lymphadenopathy.  Musculoskeletal: Full ROM, 5/5 strength, Normal gait Skin: Warm, dry without rashes, lesions, ecchymosis.  Neuro: Cranial nerves intact. No cerebellar symptoms.  Psych: Awake and oriented X 3, normal affect, Insight and Judgment appropriate.  PHQ-2: 0   Dan Maker, NP 9:39 AM [facility]

## 2018-10-05 ENCOUNTER — Ambulatory Visit: Payer: BLUE CROSS/BLUE SHIELD | Admitting: Adult Health

## 2018-10-05 ENCOUNTER — Encounter: Payer: Self-pay | Admitting: Adult Health

## 2018-10-05 VITALS — BP 116/78 | HR 62 | Temp 97.5°F | Ht 70.0 in | Wt 245.0 lb

## 2018-10-05 DIAGNOSIS — I10 Essential (primary) hypertension: Secondary | ICD-10-CM | POA: Diagnosis not present

## 2018-10-05 DIAGNOSIS — Z79899 Other long term (current) drug therapy: Secondary | ICD-10-CM

## 2018-10-05 DIAGNOSIS — E559 Vitamin D deficiency, unspecified: Secondary | ICD-10-CM | POA: Diagnosis not present

## 2018-10-05 DIAGNOSIS — K219 Gastro-esophageal reflux disease without esophagitis: Secondary | ICD-10-CM

## 2018-10-05 DIAGNOSIS — E669 Obesity, unspecified: Secondary | ICD-10-CM

## 2018-10-05 DIAGNOSIS — R7303 Prediabetes: Secondary | ICD-10-CM

## 2018-10-05 DIAGNOSIS — E782 Mixed hyperlipidemia: Secondary | ICD-10-CM

## 2018-10-05 MED ORDER — FAMOTIDINE 20 MG PO TABS: 20.0000 mg | ORAL_TABLET | Freq: Two times a day (BID) | ORAL | 1 refills | Status: DC | PRN

## 2018-10-05 NOTE — Patient Instructions (Addendum)
Goals    . Exercise 150 min/wk Moderate Activity    . Weight (lb) < 230 lb (104.3 kg)      Know what a healthy weight is for you (roughly BMI <25) and aim to maintain this  Aim for 7+ servings of fruits and vegetables daily  65-80+ fluid ounces of water or unsweet tea for healthy kidneys  Limit to max 1 drink of alcohol per day; avoid smoking/tobacco  Limit animal fats in diet for cholesterol and heart health - choose grass fed whenever available  Avoid highly processed foods, and foods high in saturated/trans fats  Aim for low stress - take time to unwind and care for your mental health  Aim for 150 min of moderate intensity exercise weekly for heart health, and weights twice weekly for bone health  Aim for 7-9 hours of sleep daily   Drink 1/2 your body weight in fluid ounces of water daily; drink a tall glass of water 30 min before meals  Don't eat until you're stuffed- listen to your stomach and eat until you are 80% full   Try eating off of a salad plate; wait 10 min after finishing before going back for seconds  Start by eating the vegetables on your plate; aim for 94% of your meals to be fruits or vegetables  Then eat your protein - lean meats (grass fed if possible), fish, beans, nuts in moderation  Eat your carbs/starch last ONLY if you still are hungry. If you can, stop before finishing it all  Avoid sugar and flour - the closer it looks to it's original form in nature, typically the better it is for you  Splurge in moderation - "assign" days when you get to splurge and have the "bad stuff" - I like to follow a 80% - 20% plan- "good" choices 80 % of the time, "bad" choices in moderation 20% of the time  Simple equation is: Calories out > calories in = weight loss - even if you eat the bad stuff, if you limit portions, you will still lose weight       When it comes to diets, agreement about the perfect plan isn't easy to find, even among the experts. Experts at  the [facility] of [employer] developed an idea known as the Healthy Eating Plate. Just imagine a plate divided into logical, healthy portions.  The emphasis is on diet quality:  Load up on vegetables and fruits - one-half of your plate: Aim for color and variety, and remember that potatoes don't count.  Go for whole grains - one-quarter of your plate: Whole wheat, barley, wheat berries, quinoa, oats, brown rice, and foods made with them. If you want pasta, go with whole wheat pasta.  Protein power - one-quarter of your plate: Fish, chicken, beans, and nuts are all healthy, versatile protein sources. Limit red meat.  The diet, however, does go beyond the plate, offering a few other suggestions.  Use healthy plant oils, such as olive, canola, soy, corn, sunflower and peanut. Check the labels, and avoid partially hydrogenated oil, which have unhealthy trans fats.  If you're thirsty, drink water. Coffee and tea are good in moderation, but skip sugary drinks and limit milk and dairy products to one or two daily servings.  The type of carbohydrate in the diet is more important than the amount. Some sources of carbohydrates, such as vegetables, fruits, whole grains, and beans-are healthier than others.  Finally, stay active.

## 2018-10-06 LAB — LIPID PANEL
Cholesterol: 162 mg/dL (ref <200)
HDL: 44 mg/dL (ref >40)
LDL Cholesterol (Calc): 99 mg/dL (calc)
Non-HDL Cholesterol (Calc): 118 mg/dL (calc) (ref <130)
Total CHOL/HDL Ratio: 3.7 (calc) (ref <5.0)
Triglycerides: 93 mg/dL (ref <150)

## 2018-10-06 LAB — CBC WITH DIFFERENTIAL/PLATELET
ABSOLUTE MONOCYTES: 494 {cells}/uL (ref 200–950)
Basophils Absolute: 18 cells/uL (ref 0–200)
Basophils Relative: 0.3 %
Eosinophils Absolute: 146 cells/uL (ref 15–500)
Eosinophils Relative: 2.4 %
HCT: 48 % (ref 38.5–50.0)
Hemoglobin: 16.7 g/dL (ref 13.2–17.1)
Lymphs Abs: 1641 cells/uL (ref 850–3900)
MCH: 31.6 pg (ref 27.0–33.0)
MCHC: 34.8 g/dL (ref 32.0–36.0)
MCV: 90.9 fL (ref 80.0–100.0)
MPV: 10.5 fL (ref 7.5–12.5)
Monocytes Relative: 8.1 %
Neutro Abs: 3800 cells/uL (ref 1500–7800)
Neutrophils Relative %: 62.3 %
Platelets: 220 10*3/uL (ref 140–400)
RBC: 5.28 10*6/uL (ref 4.20–5.80)
RDW: 12.4 % (ref 11.0–15.0)
Total Lymphocyte: 26.9 %
WBC: 6.1 10*3/uL (ref 3.8–10.8)

## 2018-10-06 LAB — COMPLETE METABOLIC PANEL WITH GFR
AG Ratio: 1.6 (calc) (ref 1.0–2.5)
ALT: 19 U/L (ref 9–46)
AST: 15 U/L (ref 10–35)
Albumin: 4.2 g/dL (ref 3.6–5.1)
Alkaline phosphatase (APISO): 58 U/L (ref 40–115)
BILIRUBIN TOTAL: 0.6 mg/dL (ref 0.2–1.2)
BUN/Creatinine Ratio: 14 (calc) (ref 6–22)
BUN: 19 mg/dL (ref 7–25)
CO2: 31 mmol/L (ref 20–32)
Calcium: 10.5 mg/dL — ABNORMAL HIGH (ref 8.6–10.3)
Chloride: 105 mmol/L (ref 98–110)
Creat: 1.32 mg/dL — ABNORMAL HIGH (ref 0.70–1.25)
GFR, Est African American: 64 mL/min/{1.73_m2} (ref >=60)
GFR, Est Non African American: 55 mL/min/{1.73_m2} — ABNORMAL LOW (ref >=60)
GLUCOSE: 117 mg/dL — AB (ref 65–99)
Globulin: 2.6 g/dL (calc) (ref 1.9–3.7)
Potassium: 4.7 mmol/L (ref 3.5–5.3)
Sodium: 143 mmol/L (ref 135–146)
Total Protein: 6.8 g/dL (ref 6.1–8.1)

## 2018-10-06 LAB — TSH: TSH: 1.55 mIU/L (ref 0.40–4.50)

## 2018-10-06 LAB — HEMOGLOBIN A1C
HEMOGLOBIN A1C: 6 %{Hb} — AB (ref <5.7)
Mean Plasma Glucose: 126 (calc)
eAG (mmol/L): 7 (calc)

## 2018-11-07 ENCOUNTER — Other Ambulatory Visit: Payer: Self-pay | Admitting: Adult Health

## 2018-11-07 DIAGNOSIS — I10 Essential (primary) hypertension: Secondary | ICD-10-CM

## 2019-01-03 ENCOUNTER — Encounter: Payer: Self-pay | Admitting: Internal Medicine

## 2019-01-03 NOTE — Patient Instructions (Signed)

## 2019-01-03 NOTE — Progress Notes (Signed)
THIS ENCOUNTER IS A VIRTUAL VISIT DUE TO COVID-19 - PATIENT WAS NOT SEEN IN THE OFFICE.  PATIENT HAS CONSENTED TO VIRTUAL VISIT / TELEMEDICINE VISIT  Virtual Visit via telephone Note  I connected with Alex Lewis on 01/04/19  by telephone.  I verified that I am speaking with the correct person using two identifiers.        I discussed the limitations of evaluation and management by telemedicine and the availability of in person appointments. The patient expressed understanding and agreed to proceed.  History of Present Illness:      This very nice 68 y.o. MWM presents for 6 month follow up with HTN, HLD, Pre-Diabetes and Vitamin D Deficiency. Patient has been off of his Pepcid & he has had a flare up of his GERD sx's with dietary indiscretions. Also he reports recent increased allergy sx's with all of the Spring pollens      Patient is treated for HTN (2001) & BP has been controlled at home. Todays BP is at goal - 115/68. Patient has had no complaints of any cardiac type chest pain, palpitations, dyspnea / orthopnea / PND, dizziness, claudication, or dependent edema.      Hyperlipidemia is controlled with diet & meds. Patient denies myalgias or other med SEs. Last Lipids were at goal: Lab Results  Component Value Date   CHOL 162 10/05/2018   HDL 44 10/05/2018   LDLCALC 99 10/05/2018   TRIG 93 10/05/2018   CHOLHDL 3.7 10/05/2018        Also, the patient has Morbid Obesity (BMI 35+) and  history of PreDiabetes (A1c 6.2% / 2011)  and has had no symptoms of reactive hypoglycemia, diabetic polys, paresthesias or visual blurring.  Last A1c was not at goal: Lab Results  Component Value Date   HGBA1C 6.0 (H) 10/05/2018   Wt Readings from Last 3 Encounters:  01/04/19 239 lb 3.2 oz (108.5 kg)  10/05/18 245 lb (111.1 kg)  06/20/18 237 lb 12.8 oz (107.9 kg)     Further, the patient also has history of Vitamin D Deficiency ("16" / 2008) and supplements vitamin D without any suspected  side-effects. Last vitamin D was at goal: Lab Results  Component Value Date   VD25OH 90 06/20/2018   Current Outpatient Medications on File Prior to Visit  Medication Sig   aspirin EC 81 MG tablet Take 81 mg by mouth daily.   B Complex-C (SUPER B COMPLEX PO) Take 1 tablet by mouth daily.   bisoprolol-hydrochlorothiazide (ZIAC) 5-6.25 MG tablet TAKE 1 TABLET BY MOUTH EVERY DAY FOR BLOOD PRESSURE   Calcium Carbonate Antacid (TUMS PO) Take by mouth.   Cholecalciferol (VITAMIN D-3) 5000 UNITS TABS Take 10,000 Units by mouth daily.   fluticasone (FLONASE) 50 MCG/ACT nasal spray Place 2 sprays into both nostrils daily.   loratadine (CLARITIN) 10 MG tablet Take 10 mg by mouth daily.   No current facility-administered medications on file prior to visit.    Allergies  Allergen Reactions   Penicillins     REACTION: unknown   PMHx:   Past Medical History:  Diagnosis Date   Allergic rhinitis    BPH (benign prostatic hyperplasia)    GERD (gastroesophageal reflux disease)    Hyperlipidemia    Hypertension    Hypogonadism, male    Kidney stones    Morbid obesity (HCC)    Pre-diabetes    Vitamin D deficiency    Immunization History  Administered Date(s) Administered   DTaP 01/26/2000  Influenza Whole 06/22/2013   Influenza, High Dose Seasonal PF 06/27/2015, 06/27/2017, 06/20/2018   Influenza-Unspecified 07/13/2014, 06/26/2016   PPD Test 05/29/2014, 07/30/2015   Pneumococcal Conjugate-13 05/13/2017   Pneumococcal Polysaccharide-23 06/20/2018   Tdap 09/06/2014   Zoster 04/22/2013   Past Surgical History:  Procedure Laterality Date   FLEXIBLE SIGMOIDOSCOPY  1992   KNEE SURGERY Right 1977   TONSILLECTOMY AND ADENOIDECTOMY  1960   FHx:    Reviewed / unchanged  SHx:    Reviewed / unchanged   Systems Review:  Constitutional: Denies fever, chills, wt changes, headaches, insomnia, fatigue, night sweats, change in appetite. Eyes: Denies redness,  blurred vision, diplopia, discharge, itchy, watery eyes.  ENT: Denies discharge, congestion, post nasal drip, epistaxis, sore throat, earache, hearing loss, dental pain, tinnitus, vertigo, sinus pain, snoring.  CV: Denies chest pain, palpitations, irregular heartbeat, syncope, dyspnea, diaphoresis, orthopnea, PND, claudication or edema. Respiratory: denies cough, dyspnea, DOE, pleurisy, hoarseness, laryngitis, wheezing.  Gastrointestinal: Denies dysphagia, odynophagia, heartburn, reflux, water brash, abdominal pain or cramps, nausea, vomiting, bloating, diarrhea, constipation, hematemesis, melena, hematochezia  or hemorrhoids. Genitourinary: Denies dysuria, frequency, urgency, nocturia, hesitancy, discharge, hematuria or flank pain. Musculoskeletal: Denies arthralgias, myalgias, stiffness, jt. swelling, pain, limping or strain/sprain.  Skin: Denies pruritus, rash, hives, warts, acne, eczema or change in skin lesion(s). Neuro: No weakness, tremor, incoordination, spasms, paresthesia or pain. Psychiatric: Denies confusion, memory loss or sensory loss. Endo: Denies change in weight, skin or hair change.  Heme/Lymph: No excessive bleeding, bruising or enlarged lymph nodes.  Physical Exam  BP 115/68    Pulse 61    Temp (!) 97.3 F (36.3 C)    Wt 239 lb 3.2 oz (108.5 kg)    BMI 34.32 kg/m    General : Well sounding patient in no apparent distress HEENT: no hoarseness, no cough for duration of visit Lungs: speaks in complete sentences, no audible wheezing, no apparent distress Neurological: alert, oriented x 3 Psychiatric: pleasant, judgement appropriate   Assessment and Plan:  1. Essential hypertension  - Continue medication, monitor blood pressure at home.  - Continue DASH diet.  Reminder to go to the ER if any CP,  SOB, nausea, dizziness, severe HA, changes vision/speech.  - CBC with Differential/Platelet; Future - COMPLETE METABOLIC PANEL WITH GFR; Future - Magnesium; Future - TSH;  Future  2. Hyperlipidemia, mixed  - Continue diet/meds, exercise,& lifestyle modifications.  - Continue monitor periodic cholesterol/liver & renal functions   - Lipid panel; Future - TSH; Future  3. Abnormal glucose  - Continue diet, exercise  - Lifestyle modifications.  - Monitor appropriate labs.  - Hemoglobin A1c; Future - Insulin, random; Future  4. Vitamin D deficiency  - Continue supplementation.  - VITAMIN D 25 Hydroxyl  5. Gastroesophageal reflux disease  - CBC with Differential/Platelet; Future  6. Prediabetes  - Hemoglobin A1c; Future - Insulin, random; Future  7. Medication management  - CBC with Differential/Platelet; Future - COMPLETE METABOLIC PANEL WITH GFR; Future - Magnesium; Future - Lipid panel; Future - TSH; Future - Hemoglobin A1c; Future - Insulin, random; Future - VITAMIN D 25 Hydroxyl; Future       Discussed  regular exercise, BP monitoring, weight control to achieve/maintain BMI less than 25 and discussed med and SE's. Recommended labs to assess and monitor clinical status with further disposition pending results of labs (patient to schedule labs draw later today). I discussed the assessment and treatment plan with the patient. The patient was provided an opportunity to ask questions and  all were answered. The patient agreed with the plan and demonstrated an understanding of the instructions.  Long discussion with patient  re:his Morbid Obesity and he is agreement tpo restart his Phentermine 37.5 mg qam and his Topiramate 50 g 1/2 -1 tab bid Suppertime & Bedtime       The patient was advised to call back or seek an in-person evaluation if the symptoms worsen or if the condition fails to improve as anticipated.       I provided  26 minutes of non-face-to-face time during this encounter and over 40 minutes of exam, counseling, chart review and  complex critical decision making was performed  Marinus Maw, MD

## 2019-01-04 ENCOUNTER — Other Ambulatory Visit: Payer: BC Managed Care – PPO

## 2019-01-04 ENCOUNTER — Ambulatory Visit: Payer: BLUE CROSS/BLUE SHIELD | Admitting: Internal Medicine

## 2019-01-04 ENCOUNTER — Other Ambulatory Visit: Payer: Self-pay

## 2019-01-04 ENCOUNTER — Encounter: Payer: Self-pay | Admitting: Internal Medicine

## 2019-01-04 VITALS — BP 115/68 | HR 61 | Temp 97.3°F | Wt 239.2 lb

## 2019-01-04 DIAGNOSIS — Z6835 Body mass index (BMI) 35.0-35.9, adult: Secondary | ICD-10-CM

## 2019-01-04 DIAGNOSIS — Z79899 Other long term (current) drug therapy: Secondary | ICD-10-CM | POA: Diagnosis not present

## 2019-01-04 DIAGNOSIS — E6609 Other obesity due to excess calories: Secondary | ICD-10-CM

## 2019-01-04 DIAGNOSIS — Z9109 Other allergy status, other than to drugs and biological substances: Secondary | ICD-10-CM

## 2019-01-04 DIAGNOSIS — E559 Vitamin D deficiency, unspecified: Secondary | ICD-10-CM | POA: Diagnosis not present

## 2019-01-04 DIAGNOSIS — R7303 Prediabetes: Secondary | ICD-10-CM

## 2019-01-04 DIAGNOSIS — E782 Mixed hyperlipidemia: Secondary | ICD-10-CM | POA: Diagnosis not present

## 2019-01-04 DIAGNOSIS — I10 Essential (primary) hypertension: Secondary | ICD-10-CM

## 2019-01-04 DIAGNOSIS — R7309 Other abnormal glucose: Secondary | ICD-10-CM

## 2019-01-04 DIAGNOSIS — K219 Gastro-esophageal reflux disease without esophagitis: Secondary | ICD-10-CM

## 2019-01-04 MED ORDER — PHENTERMINE HCL 37.5 MG PO TABS: ORAL_TABLET | ORAL | 1 refills | Status: DC

## 2019-01-04 MED ORDER — TOPIRAMATE 50 MG PO TABS: ORAL_TABLET | ORAL | 1 refills | Status: DC

## 2019-01-04 MED ORDER — MONTELUKAST SODIUM 10 MG PO TABS: ORAL_TABLET | ORAL | 3 refills | Status: DC

## 2019-01-04 MED ORDER — FAMOTIDINE 20 MG PO TABS: ORAL_TABLET | ORAL | 3 refills | Status: DC

## 2019-01-05 LAB — HEMOGLOBIN A1C
Hgb A1c MFr Bld: 6 % of total Hgb — ABNORMAL HIGH (ref <5.7)
Mean Plasma Glucose: 126 (calc)
eAG (mmol/L): 7 (calc)

## 2019-01-05 LAB — CBC WITH DIFFERENTIAL/PLATELET
Absolute Monocytes: 481 cells/uL (ref 200–950)
Basophils Absolute: 39 cells/uL (ref 0–200)
Basophils Relative: 0.6 %
Eosinophils Absolute: 241 cells/uL (ref 15–500)
Eosinophils Relative: 3.7 %
HCT: 45.2 % (ref 38.5–50.0)
Hemoglobin: 15.8 g/dL (ref 13.2–17.1)
Lymphs Abs: 1892 cells/uL (ref 850–3900)
MCH: 32.1 pg (ref 27.0–33.0)
MCHC: 35 g/dL (ref 32.0–36.0)
MCV: 91.9 fL (ref 80.0–100.0)
MPV: 10.4 fL (ref 7.5–12.5)
Monocytes Relative: 7.4 %
Neutro Abs: 3848 cells/uL (ref 1500–7800)
Neutrophils Relative %: 59.2 %
Platelets: 203 10*3/uL (ref 140–400)
RBC: 4.92 10*6/uL (ref 4.20–5.80)
RDW: 12.2 % (ref 11.0–15.0)
Total Lymphocyte: 29.1 %
WBC: 6.5 10*3/uL (ref 3.8–10.8)

## 2019-01-05 LAB — LIPID PANEL
Cholesterol: 143 mg/dL (ref <200)
HDL: 41 mg/dL (ref >=40)
LDL Cholesterol (Calc): 84 mg/dL (calc)
Non-HDL Cholesterol (Calc): 102 mg/dL (calc) (ref <130)
Total CHOL/HDL Ratio: 3.5 (calc) (ref <5.0)
Triglycerides: 85 mg/dL (ref <150)

## 2019-01-05 LAB — COMPLETE METABOLIC PANEL WITH GFR
AG Ratio: 2 (calc) (ref 1.0–2.5)
ALT: 20 U/L (ref 9–46)
AST: 16 U/L (ref 10–35)
Albumin: 4.3 g/dL (ref 3.6–5.1)
Alkaline phosphatase (APISO): 54 U/L (ref 35–144)
BUN: 13 mg/dL (ref 7–25)
CO2: 27 mmol/L (ref 20–32)
Calcium: 9.5 mg/dL (ref 8.6–10.3)
Chloride: 104 mmol/L (ref 98–110)
Creat: 1.15 mg/dL (ref 0.70–1.25)
GFR, Est African American: 76 mL/min/{1.73_m2} (ref >=60)
GFR, Est Non African American: 65 mL/min/{1.73_m2} (ref >=60)
Globulin: 2.1 g/dL (calc) (ref 1.9–3.7)
Glucose, Bld: 121 mg/dL — ABNORMAL HIGH (ref 65–99)
Potassium: 4.1 mmol/L (ref 3.5–5.3)
Sodium: 140 mmol/L (ref 135–146)
Total Bilirubin: 0.7 mg/dL (ref 0.2–1.2)
Total Protein: 6.4 g/dL (ref 6.1–8.1)

## 2019-01-05 LAB — TSH: TSH: 1.75 mIU/L (ref 0.40–4.50)

## 2019-01-05 LAB — MAGNESIUM: Magnesium: 1.9 mg/dL (ref 1.5–2.5)

## 2019-01-05 LAB — VITAMIN D 25 HYDROXY (VIT D DEFICIENCY, FRACTURES): Vit D, 25-Hydroxy: 75 ng/mL (ref 30–100)

## 2019-01-05 LAB — INSULIN, RANDOM: Insulin: 7.5 u[IU]/mL

## 2019-02-14 ENCOUNTER — Other Ambulatory Visit: Payer: Self-pay | Admitting: Internal Medicine

## 2019-03-05 ENCOUNTER — Other Ambulatory Visit: Payer: Self-pay | Admitting: Internal Medicine

## 2019-03-05 DIAGNOSIS — E6609 Other obesity due to excess calories: Secondary | ICD-10-CM

## 2019-03-15 ENCOUNTER — Ambulatory Visit: Payer: BLUE CROSS/BLUE SHIELD | Admitting: Adult Health

## 2019-03-15 ENCOUNTER — Other Ambulatory Visit: Payer: Self-pay

## 2019-03-15 ENCOUNTER — Encounter: Payer: Self-pay | Admitting: Adult Health

## 2019-03-15 VITALS — BP 110/70 | HR 65 | Temp 97.3°F | Ht 70.0 in | Wt 236.0 lb

## 2019-03-15 DIAGNOSIS — R600 Localized edema: Secondary | ICD-10-CM | POA: Diagnosis not present

## 2019-03-15 MED ORDER — FUROSEMIDE 40 MG PO TABS: ORAL_TABLET | ORAL | 0 refills | Status: DC

## 2019-03-15 NOTE — Progress Notes (Signed)
Assessment and Plan:  Alex Lewis was seen today for leg swelling and numbness.  Diagnoses and all orders for this visit:  Unilateral edema of lower extremity Cannot r/o DVT though without expected pain, Homan's negative Check basic labs, screening vasculitis labs, D dimer to screen for clot, he declines vascular ultrasound for now Per Dr. Oneta RackMckeown suggestion with try lasix over the weekend Elevate extremity Advised to present the ER if any chest pain, shortness of breath, nausea, dizziness, severe HA, changes vision/speech Patient in agreement with above plan of care, understands risks assosciated with DVT; continue ASA; will plan to initiate anticoag and proceed with vascular US if d dimer significantly elevated -     CBC with Differential/Platelet -     Sedimentation rate -     C-reactive protein -     D-dimer, quantitative (not at [facility]) -     furosemide (LASIX) 40 MG tablet; Take 1 tab daily as needed for edema.  Further disposition pending results of labs. Discussed med's effects and SE's.   Over 15 minutes of exam, counseling, chart review, and critical decision making was performed.   Future Appointments  Date Time Provider Department Center  04/05/2019  8:45 AM Quentin Mullingollier, Amanda, PA-C [facility] None  07/19/2019 10:00 AM Lucky CowboyMcKeown, William, MD [facility] None    ------------------------------------------------------------------------------------------------------------------   HPI BP 110/70   Pulse 65   Temp (!) 97.3 F (36.3 C)   Ht 5\' 10"  (1.778 m)   Wt 236 lb (107 kg)   SpO2 97%   BMI 33.86 kg/m   68 y.o.male remote former smoker (quit in 1977), htn, hyperlipidemia, morbid obesity presents for evaluation of LE edema; he reports he noted subtle swelling of R ankle yesterday, awoke this AM and extremity remained edematous and presents for evaluation. Denies pain, but endorses vague generalized dull "numb" sensation. Denies recent travel, but admits he sits 8+ hours daily at  work.   No previous hx of unilateral edema, no personal or family hx of clots or clotting disorders. He is on daily aASA.   BMI is Body mass index is 33.86 kg/m. Wt Readings from Last 3 Encounters:  03/15/19 236 lb (107 kg)  01/04/19 239 lb 3.2 oz (108.5 kg)  10/05/18 245 lb (111.1 kg)     Past Medical History:  Diagnosis Date  . Allergic rhinitis   . BPH (benign prostatic hyperplasia)   . GERD (gastroesophageal reflux disease)   . Hyperlipidemia   . Hypertension   . Hypogonadism, male   . Kidney stones   . Morbid obesity (HCC)   . Pre-diabetes   . Vitamin D deficiency      Allergies  Allergen Reactions  . Penicillins     REACTION: unknown    Current Outpatient Medications on File Prior to Visit  Medication Sig  . aspirin EC 81 MG tablet Take 81 mg by mouth daily.  . B Complex-C (SUPER B COMPLEX PO) Take 1 tablet by mouth daily.  . bisoprolol-hydrochlorothiazide (ZIAC) 5-6.25 MG tablet TAKE 1 TABLET BY MOUTH EVERY DAY FOR BLOOD PRESSURE  . Calcium Carbonate Antacid (TUMS PO) Take by mouth.  . Cholecalciferol (VITAMIN D-3) 5000 UNITS TABS Take 10,000 Units by mouth daily.  . famotidine (PEPCID) 20 MG tablet Take 1 tablet 2 x /day with meals for Acid Indigestion & Reflux  . fluticasone (FLONASE) 50 MCG/ACT nasal spray SPRAY 2 SPRAYS INTO EACH NOSTRIL EVERY DAY  . loratadine (CLARITIN) 10 MG tablet Take 10 mg by mouth daily.  .Marland Kitchen  montelukast (SINGULAIR) 10 MG tablet Take 1 tablet daily for Allergies  . phentermine (ADIPEX-P) 37.5 MG tablet Take 1/2 to 1 tablet every Morning for Dieting & Weight Loss (Patient not taking: Reported on 03/15/2019)  . topiramate (TOPAMAX) 50 MG tablet Take 1/2 to 1 tablet 2 x /day at Suppertime & Bedtime for Dieting & Weight Loss (Patient not taking: Reported on 03/15/2019)   No current facility-administered medications on file prior to visit.     ROS: all negative except above.   Physical Exam:  BP 110/70   Pulse 65   Temp (!) 97.3 F  (36.3 C)   Ht 5\' 10"  (1.778 m)   Wt 236 lb (107 kg)   SpO2 97%   BMI 33.86 kg/m   General Appearance: Well nourished, in no apparent distress. Eyes: PERRLA, conjunctiva no swelling or erythema ENT/Mouth: No erythema, swelling, or exudate on post pharynx.  Tonsils not swollen or erythematous. Hearing normal.  Neck: Supple Respiratory: Respiratory effort normal, BS equal bilaterally without rales, rhonchi, wheezing or stridor.  Cardio: RRR with no MRGs. Brisk peripheral pulses with unilateral scant pitting edema through R foot, ankle, lower leg up to calf, heat symmetrical, not cool or hot, subtly pink, blanches quickly throughout, he does have superficial varicose veins of RLE per patient consistent with his baseline of several years, non-tender, homan's negative Abdomen: Soft, + BS.  Non tender, no guarding, rebound, hernias, masses. Lymphatics: Non tender without lymphadenopathy.  Musculoskeletal: Full ROM, normal gait. Nontender throughout, no palpable abnormalities Skin: Warm, dry without rashes, lesions, ecchymosis or wounds.  Neuro: Sensation intact.  Psych: Awake and oriented X 3, normal affect, Insight and Judgment appropriate.     Izora Ribas, NP 12:42 PM [facility]

## 2019-03-15 NOTE — Patient Instructions (Addendum)
Start lasix 40 mg once daily for the next 3 days  Call to report progress on Monday  I am checking a d dimer today (lab to check for possible clot) and inflammation labs  Go to the ER if any chest pain, shortness of breath, nausea, dizziness, severe HA, changes vision/speech    Peripheral Edema  Peripheral edema is swelling that is caused by a buildup of fluid. Peripheral edema most often affects the lower legs, ankles, and feet. It can also develop in the arms, hands, and face. The area of the body that has peripheral edema will look swollen. It may also feel heavy or warm. Your clothes may start to feel tight. Pressing on the area may make a temporary dent in your skin. You may not be able to move your arm or leg as much as usual. There are many causes of peripheral edema. It can be a complication of other diseases, such as congestive heart failure, kidney disease, or a problem with your blood circulation. It also can be a side effect of certain medicines. It often happens to women during pregnancy. Sometimes, the cause is not known. Treating the underlying condition is often the only treatment for peripheral edema. Follow these instructions at home: Pay attention to any changes in your symptoms. Take these actions to help with your discomfort:  Raise (elevate) your legs while you are sitting or lying down.  Move around often to prevent stiffness and to lessen swelling. Do not sit or stand for long periods of time.  Wear support stockings as told by your health care provider.  Follow instructions from your health care provider about limiting salt (sodium) in your diet. Sometimes eating less salt can reduce swelling.  Take over-the-counter and prescription medicines only as told by your health care provider. Your health care provider may prescribe medicine to help your body get rid of excess water (diuretic).  Keep all follow-up visits as told by your health care provider. This is  important. Contact a health care provider if:  You have a fever.  Your edema starts suddenly or is getting worse, especially if you are pregnant or have a medical condition.  You have swelling in only one leg.  You have increased swelling and pain in your legs. Get help right away if:  You develop shortness of breath, especially when you are lying down.  You have pain in your chest or abdomen.  You feel weak.  You faint. This information is not intended to replace advice given to you by your health care provider. Make sure you discuss any questions you have with your health care provider. Document Released: 10/21/2004 Document Revised: 02/16/2016 Document Reviewed: 03/26/2015 Elsevier Interactive Patient Education  2019 [employer]vier Inc.      Deep Vein Thrombosis  Deep vein thrombosis (DVT) is a condition in which a blood clot forms in a deep vein, such as a lower leg, thigh, or arm vein. A clot is blood that has thickened into a gel or solid. This condition is dangerous. It can lead to serious and even life-threatening complications if the clot travels to the lungs and causes a blockage (pulmonary embolism). It can also damage veins in the leg. This can result in leg pain, swelling, discoloration, and sores (post-thrombotic syndrome). What are the causes? This condition may be caused by:  A slowdown of blood flow.  Damage to a vein.  A condition that causes blood to clot more easily, such as an inherited clotting disorder.  What increases the risk? The following factors may make you more likely to develop this condition:  Being overweight.  Being older, especially over age 52.  Sitting or lying down for more than four hours.  Being in the hospital.  Lack of physical activity (sedentary lifestyle).  Pregnancy, being in childbirth, or having recently given birth.  Taking medicines that contain estrogen, such as medicines to prevent pregnancy.  Smoking.  A history  of any of the following: ? Blood clots or a blood clotting disease. ? Peripheral vascular disease. ? Inflammatory bowel disease. ? Cancer. ? Heart disease. ? Genetic conditions that affect how your blood clots, such as Factor V Leiden mutation. ? Neurological diseases that affect your legs (leg paresis). ? A recent injury, such as a car accident. ? Major or lengthy surgery. ? A central line placed inside a large vein. What are the signs or symptoms? Symptoms of this condition include:  Swelling, pain, or tenderness in an arm or leg.  Warmth, redness, or discoloration in an arm or leg. If the clot is in your leg, symptoms may be more noticeable or worse when you stand or walk. Some people may not develop any symptoms. How is this diagnosed? This condition is diagnosed with:  A medical history and physical exam.  Tests, such as: ? Blood tests. These are done to check how well your blood clots. ? Ultrasound. This is done to check for clots. ? Venogram. For this test, contrast dye is injected into a vein and X-rays are taken to check for any clots. How is this treated? Treatment for this condition depends on:  The cause of your DVT.  Your risk for bleeding or developing more clots.  Any other medical conditions that you have. Treatment may include:  Taking a blood thinner (anticoagulant). This type of medicine prevents clots from forming. It may be taken by mouth, injected under the skin, or injected through an IV (catheter).  Injecting clot-dissolving medicines into the affected vein (catheter-directed thrombolysis).  Having surgery. Surgery may be done to: ? Remove the clot. ? Place a filter in a large vein to catch blood clots before they reach the lungs. Some treatments may be continued for up to six months. Follow these instructions at home: If you are taking blood thinners:  Take the medicine exactly as told by your health care provider. Some blood thinners need to  be taken at the same time every day. Do not skip a dose.  Talk with your health care provider before you take any medicines that contain aspirin or NSAIDs. These medicines increase your risk for dangerous bleeding.  Ask your health care provider about foods and drugs that could change the way the medicine works (may interact). Avoid those things if your health care provider tells you to do so.  Blood thinners can cause easy bruising and may make it difficult to stop bleeding. Because of this: ? Be very careful when using knives, scissors, or other sharp objects. ? Use an electric razor instead of a blade. ? Avoid activities that could cause injury or bruising, and follow instructions about how to prevent falls.  Wear a medical alert bracelet or carry a card that lists what medicines you take. General instructions  Take over-the-counter and prescription medicines only as told by your health care provider.  Return to your normal activities as told by your health care provider. Ask your health care provider what activities are safe for you.  Wear compression stockings  if recommended by your health care provider.  Keep all follow-up visits as told by your health care provider. This is important. How is this prevented? To lower your risk of developing this condition again:  For 30 or more minutes every day, do an activity that: ? Involves moving your arms and legs. ? Increases your heart rate.  When traveling for longer than four hours: ? Exercise your arms and legs every hour. ? Drink plenty of water. ? Avoid drinking alcohol.  Avoid sitting or lying for a long time without moving your legs.  If you have surgery or you are hospitalized, ask about ways to prevent blood clots. These may include taking frequent walks or using anticoagulants.  Stay at a healthy weight.  If you are a woman who is older than age 68, avoid unnecessary use of medicines that contain estrogen, such as some  birth control pills.  Do not use any products that contain nicotine or tobacco, such as cigarettes and e-cigarettes. This is especially important if you take estrogen medicines. If you need help quitting, ask your health care provider. Contact a health care provider if:  You miss a dose of your blood thinner.  Your menstrual period is heavier than usual.  You have unusual bruising. Get help right away if:  You have: ? New or increased pain, swelling, or redness in an arm or leg. ? Numbness or tingling in an arm or leg. ? Shortness of breath. ? Chest pain. ? A rapid or irregular heartbeat. ? A severe headache or confusion. ? A cut that will not stop bleeding.  There is blood in your vomit, stool, or urine.  You have a serious fall or accident, or you hit your head.  You feel light-headed or dizzy.  You cough up blood. These symptoms may represent a serious problem that is an emergency. Do not wait to see if the symptoms will go away. Get medical help right away. Call your local emergency services (911 in the [place] Do not drive yourself to the hospital. Summary  Deep vein thrombosis (DVT) is a condition in which a blood clot forms in a deep vein, such as a lower leg, thigh, or arm vein.  Symptoms can include swelling, warmth, pain, and redness in your leg or arm.  This condition may be treated with a blood thinner (anticoagulant medicine), medicine that is injected to dissolve blood clots,compression stockings, or surgery.  If you are prescribed blood thinners, take them exactly as told. This information is not intended to replace advice given to you by your health care provider. Make sure you discuss any questions you have with your health care provider. Document Released: 09/13/2005 Document Revised: 02/11/2017 Document Reviewed: 02/11/2017 Elsevier Interactive Patient Education  2019 [employer]

## 2019-03-16 ENCOUNTER — Other Ambulatory Visit: Payer: Self-pay | Admitting: Adult Health

## 2019-03-16 DIAGNOSIS — R7989 Other specified abnormal findings of blood chemistry: Secondary | ICD-10-CM

## 2019-03-16 DIAGNOSIS — R0989 Other specified symptoms and signs involving the circulatory and respiratory systems: Secondary | ICD-10-CM

## 2019-03-16 LAB — CBC WITH DIFFERENTIAL/PLATELET
Absolute Monocytes: 738 cells/uL (ref 200–950)
Basophils Absolute: 33 cells/uL (ref 0–200)
Basophils Relative: 0.4 %
Eosinophils Absolute: 230 cells/uL (ref 15–500)
Eosinophils Relative: 2.8 %
HCT: 45 % (ref 38.5–50.0)
Hemoglobin: 15.5 g/dL (ref 13.2–17.1)
Lymphs Abs: 1738 cells/uL (ref 850–3900)
MCH: 32 pg (ref 27.0–33.0)
MCHC: 34.4 g/dL (ref 32.0–36.0)
MCV: 93 fL (ref 80.0–100.0)
MPV: 10.4 fL (ref 7.5–12.5)
Monocytes Relative: 9 %
Neutro Abs: 5461 cells/uL (ref 1500–7800)
Neutrophils Relative %: 66.6 %
Platelets: 213 10*3/uL (ref 140–400)
RBC: 4.84 10*6/uL (ref 4.20–5.80)
RDW: 12.5 % (ref 11.0–15.0)
Total Lymphocyte: 21.2 %
WBC: 8.2 10*3/uL (ref 3.8–10.8)

## 2019-03-16 LAB — D-DIMER, QUANTITATIVE: D-Dimer, Quant: 10.9 mcg/mL FEU — ABNORMAL HIGH (ref <0.50)

## 2019-03-16 LAB — SEDIMENTATION RATE: Sed Rate: 11 mm/h (ref 0–20)

## 2019-03-16 LAB — C-REACTIVE PROTEIN: CRP: 19.7 mg/L — ABNORMAL HIGH (ref <8.0)

## 2019-03-16 MED ORDER — XARELTO VTE STARTER PACK 15 & 20 MG PO TBPK: ORAL_TABLET | ORAL | 0 refills | Status: DC

## 2019-03-19 ENCOUNTER — Encounter (HOSPITAL_COMMUNITY): Payer: Self-pay

## 2019-03-19 ENCOUNTER — Ambulatory Visit (HOSPITAL_COMMUNITY)
Admission: RE | Admit: 2019-03-19 | Discharge: 2019-03-19 | Disposition: A | Discharge status: Home / Self Care | Payer: BC Managed Care – PPO | Source: Ambulatory Visit | Attending: Cardiovascular Disease | Admitting: Cardiovascular Disease

## 2019-03-19 ENCOUNTER — Other Ambulatory Visit: Payer: Self-pay

## 2019-03-19 ENCOUNTER — Other Ambulatory Visit: Payer: Self-pay | Admitting: Adult Health

## 2019-03-19 DIAGNOSIS — R0989 Other specified symptoms and signs involving the circulatory and respiratory systems: Secondary | ICD-10-CM | POA: Diagnosis not present

## 2019-03-19 DIAGNOSIS — R7989 Other specified abnormal findings of blood chemistry: Secondary | ICD-10-CM | POA: Diagnosis not present

## 2019-03-19 DIAGNOSIS — I82401 Acute embolism and thrombosis of unspecified deep veins of right lower extremity: Secondary | ICD-10-CM | POA: Insufficient documentation

## 2019-03-19 DIAGNOSIS — I824Y1 Acute embolism and thrombosis of unspecified deep veins of right proximal lower extremity: Secondary | ICD-10-CM

## 2019-03-19 MED ORDER — RIVAROXABAN 20 MG PO TABS: ORAL_TABLET | ORAL | 1 refills | Status: DC

## 2019-03-19 NOTE — Progress Notes (Unsigned)
Bilateral venous duplex exam completed.  See results under Chart Review- CV Proc.  Spoke with Lenna Sciara, CMA at [place] office.  Patient was told to wait in our waiting room until he received a phone call from [place] office.   Cira Servant, RVT, RDCS, RDMS  [facility] Dept at [facility]

## 2019-04-03 NOTE — Progress Notes (Signed)
FOLLOW UP  Assessment and Plan:   Essential hypertension - continue medications, DASH diet, exercise and monitor at home. Call if greater than 130/80.  -     CBC with Differential/Platelet -     COMPLETE METABOLIC PANEL WITH GFR -     TSH  Hyperlipidemia check lipids decrease fatty foods increase activity.  -     Lipid panel  Abnormal glucose Discussed disease progression and risks Discussed diet/exercise, weight management and risk modification -     Hemoglobin A1c  Medication management -     Magnesium  Vitamin D deficiency -     VITAMIN D 25 Hydroxy (Vit-D Deficiency, Fractures)  Morbid obesity ([place]) - follow up 3 months for progress monitoring - increase veggies, decrease carbs- continue weight loss - long discussion about weight loss, diet, and exercise- increase exercise.   Depression, major, recurrent, in partial remission (Alex Lewis) - continue medications, stress management techniques discussed, increase water, good sleep hygiene discussed, increase exercise, and increase veggies.   Acute deep vein thrombosis (DVT) of proximal vein of right lower extremity (HCC) -     rivaroxaban (XARELTO) 20 MG TABS tablet; Initiate after completion of initial starter pack, take for total of 3 months. Do not take with aspirin. X 3 months - get colonoscopy, get CXR, last PSA normal No family history Discussed likely related to obesity and inactivity he is working on this  Cough -     DG Chest 2 View; Future Get on PPI Continue weight loss  Screening, anemia, deficiency, iron -     Iron,Total/Total Iron Binding Cap -     Vitamin B12    Continue diet and meds as discussed. Further disposition pending results of labs. Discussed med's effects and SE's.   Over 30 minutes of exam, counseling, chart review, and critical decision making was performed.   Future Appointments  Date Time Provider Man  07/19/2019 10:00 AM Unk Pinto, MD [facility] None     ----------------------------------------------------------------------------------------------------------------------  HPI 68 y.o. male  presents for 3 month follow up on hypertension, cholesterol, prediabetes, obesity, depression, GERD and vitamin D deficiency.   He was diagnosed with blood clot right leg, has finished the xarelto starter pack, will send in continuation. He is obese, he has been immobile with work and we think that may be the factor that contributed. He does not have a family history. He is overdue for colonoscopy, last one was 2009, reminded to call. Lab Results  Component Value Date   PSA 0.8 06/20/2018   PSA 0.6 05/13/2017   PSA 0.67 07/30/2015     he has a diagnosis of depression and was prescribed celexa 20 mg - he reports he never took, reports symptoms are well controlled, and stress levels have improved.   BMI is Body mass index is 33.17 kg/m., he has been working on diet, he states that he could not tolerate the phentermine and topamax, he was not told the side effect and states he got dizzy, nausea, palpations and has stopped both medications.  Wt Readings from Last 3 Encounters:  04/05/19 231 lb 3.2 oz (104.9 kg)  03/15/19 236 lb (107 kg)  01/04/19 239 lb 3.2 oz (108.5 kg)   he has a diagnosis of GERD, not on a medication at this time. He complains of sporadic dry cough.  he reports symptoms is not currently well controlled, and denies breakthrough reflux, burning in chest, hoarseness    His blood pressure has been controlled at home, today  their BP is BP: 124/76  He does not workout. He denies chest pain, shortness of breath, dizziness.   He is not on cholesterol medication and denies myalgias. His cholesterol is at goal. The cholesterol last visit was:   Lab Results  Component Value Date   CHOL 143 01/04/2019   HDL 41 01/04/2019   LDLCALC 84 01/04/2019   TRIG 85 01/04/2019   CHOLHDL 3.5 01/04/2019    He has been working on diet for  prediabetes, and denies increased appetite, nausea, paresthesia of the feet, polydipsia, polyuria, visual disturbances and vomiting. Last A1C in the office was:  Lab Results  Component Value Date   HGBA1C 6.0 (H) 01/04/2019   Patient is on Vitamin D supplement and at goal:   Lab Results  Component Value Date   VD25OH 7275 01/04/2019      Current Medications:  Current Outpatient Medications on File Prior to Visit  Medication Sig  . B Complex-C (SUPER B COMPLEX PO) Take 1 tablet by mouth daily.  . bisoprolol-hydrochlorothiazide (ZIAC) 5-6.25 MG tablet TAKE 1 TABLET BY MOUTH EVERY DAY FOR BLOOD PRESSURE  . Calcium Carbonate Antacid (TUMS PO) Take by mouth.  . Cholecalciferol (VITAMIN D-3) 5000 UNITS TABS Take 10,000 Units by mouth daily.  . famotidine (PEPCID) 20 MG tablet Take 1 tablet 2 x /day with meals for Acid Indigestion & Reflux  . fluticasone (FLONASE) 50 MCG/ACT nasal spray SPRAY 2 SPRAYS INTO EACH NOSTRIL EVERY DAY  . furosemide (LASIX) 40 MG tablet Take 1 tab daily as needed for edema.  Marland Kitchen. loratadine (CLARITIN) 10 MG tablet Take 10 mg by mouth daily.  . montelukast (SINGULAIR) 10 MG tablet Take 1 tablet daily for Allergies   No current facility-administered medications on file prior to visit.      Allergies:  Allergies  Allergen Reactions  . Penicillins     REACTION: unknown     Medical History:  Past Medical History:  Diagnosis Date  . Allergic rhinitis   . BPH (benign prostatic hyperplasia)   . GERD (gastroesophageal reflux disease)   . Hyperlipidemia   . Hypertension   . Hypogonadism, male   . Kidney stones   . Morbid obesity (HCC)   . Pre-diabetes   . Vitamin D deficiency    Family history- Reviewed and unchanged Social history- Reviewed and unchanged   Review of Systems:  Review of Systems  Constitutional: Negative for malaise/fatigue and weight loss.  HENT: Negative for hearing loss and tinnitus.   Eyes: Negative for blurred vision and double  vision.  Respiratory: Positive for cough. Negative for hemoptysis, sputum production, shortness of breath and wheezing.   Cardiovascular: Positive for leg swelling. Negative for chest pain, palpitations, orthopnea and claudication.  Gastrointestinal: Negative for abdominal pain, blood in stool, constipation, diarrhea, heartburn, melena, nausea and vomiting.  Genitourinary: Negative.   Musculoskeletal: Negative for joint pain and myalgias.  Skin: Negative for rash.  Neurological: Negative for dizziness, tingling, sensory change, weakness and headaches.  Endo/Heme/Allergies: Negative for polydipsia.  Psychiatric/Behavioral: Negative.   All other systems reviewed and are negative.   Physical Exam: BP 124/76   Pulse 70   Temp (!) 97.2 F (36.2 C)   Ht 5\' 10"  (1.778 m)   Wt 231 lb 3.2 oz (104.9 kg)   SpO2 99%   BMI 33.17 kg/m  Wt Readings from Last 3 Encounters:  04/05/19 231 lb 3.2 oz (104.9 kg)  03/15/19 236 lb (107 kg)  01/04/19 239 lb 3.2 oz (108.5  kg)   General Appearance: Well nourished, in no apparent distress. Eyes: PERRLA, EOMs, conjunctiva no swelling or erythema Sinuses: No Frontal/maxillary tenderness ENT/Mouth: Ext aud canals clear, TMs without erythema, bulging. No erythema, swelling, or exudate on post pharynx.  Tonsils not swollen or erythematous. Hearing normal.  Neck: Supple, thyroid normal.  Respiratory: Respiratory effort normal, BS equal bilaterally without rales, rhonchi, wheezing or stridor.  Cardio: RRR with no MRGs. Brisk peripheral pulses with mild edema right ankle, some varicose veins in right leg, no warmth, tenderness, erythema.  Abdomen: Soft, + BS.  Non tender, no guarding, rebound, hernias, masses. Lymphatics: Non tender without lymphadenopathy.  Musculoskeletal: Full ROM, 5/5 strength, Normal gait Skin: Warm, dry without rashes, lesions, ecchymosis.  Neuro: Cranial nerves intact. No cerebellar symptoms.  Psych: Awake and oriented X 3, normal  affect, Insight and Judgment appropriate.    Alex MullingAmanda Taher Vannote, PA-C 11:24 AM [facility]

## 2019-04-05 ENCOUNTER — Ambulatory Visit (INDEPENDENT_AMBULATORY_CARE_PROVIDER_SITE_OTHER): Payer: BLUE CROSS/BLUE SHIELD | Admitting: Physician Assistant

## 2019-04-05 ENCOUNTER — Other Ambulatory Visit: Payer: Self-pay

## 2019-04-05 ENCOUNTER — Encounter: Payer: Self-pay | Admitting: Physician Assistant

## 2019-04-05 VITALS — BP 124/76 | HR 70 | Temp 97.2°F | Ht 70.0 in | Wt 231.2 lb

## 2019-04-05 DIAGNOSIS — I10 Essential (primary) hypertension: Secondary | ICD-10-CM | POA: Diagnosis not present

## 2019-04-05 DIAGNOSIS — E782 Mixed hyperlipidemia: Secondary | ICD-10-CM | POA: Diagnosis not present

## 2019-04-05 DIAGNOSIS — R7309 Other abnormal glucose: Secondary | ICD-10-CM | POA: Diagnosis not present

## 2019-04-05 DIAGNOSIS — Z79899 Other long term (current) drug therapy: Secondary | ICD-10-CM

## 2019-04-05 DIAGNOSIS — R059 Cough, unspecified: Secondary | ICD-10-CM

## 2019-04-05 DIAGNOSIS — I824Y1 Acute embolism and thrombosis of unspecified deep veins of right proximal lower extremity: Secondary | ICD-10-CM

## 2019-04-05 DIAGNOSIS — R05 Cough: Secondary | ICD-10-CM

## 2019-04-05 DIAGNOSIS — F3341 Major depressive disorder, recurrent, in partial remission: Secondary | ICD-10-CM

## 2019-04-05 DIAGNOSIS — Z130 Encounter for screening for diseases of the blood and blood-forming organs and certain disorders involving the immune mechanism: Secondary | ICD-10-CM | POA: Diagnosis not present

## 2019-04-05 DIAGNOSIS — E559 Vitamin D deficiency, unspecified: Secondary | ICD-10-CM

## 2019-04-05 MED ORDER — RIVAROXABAN 20 MG PO TABS: ORAL_TABLET | ORAL | 2 refills | Status: DC

## 2019-04-05 NOTE — Patient Instructions (Addendum)
Can do over the counter or prescription of voltern gel, this is a topical antiinflammatory Can do biofreeze If need to can take ibuprofen but do this very rarely Increases risk of bleeding and of GI bleeds.   MAXIMUM AMOUNT OF TYLENOL IN A DAY  You can take tylenol (500mg ) or tylenol arthritis (650mg ) with the meloxicam/antiinflammatories. The max you can take of tylenol a day is 3000mg  daily, this is a max of 6 pills a day of the regular tyelnol (500mg ) or a max of 4 a day of the tylenol arthritis (650mg ) as long as no other medications you are taking contain tylenol.   INFORMATION ABOUT YOUR XRAY  Can walk into [address] for an Personal assistantxray. They will have the order and take you back. You do not any paper work, I should get the result back today or tomorrow. This order is good for a year.  Can call [phone] to schedule an appointment if you wish.    Silent reflux: Not all heartburn burns...Marland Kitchen.Marland Kitchen.Marland Kitchen.  What is LPR? Laryngopharyngeal reflux (LPR) or silent reflux is a condition in which acid that is made in the stomach travels up the esophagus (swallowing tube) and gets to the throat. Not everyone with reflux has a lot of heartburn or indigestion. In fact, many people with LPR never have heartburn. This is why LPR is called SILENT REFLUX, and the terms "Silent reflux" and "LPR" are often used interchangeably. Because LPR is silent, it is sometimes difficult to diagnose.  How can you tell if you have LPR?  Marland Kitchen. Chronic hoarseness- Some people have hoarseness that comes and goes . throat clearing  . Cough . It can cause shortness of breath and cause asthma like symptoms. Marland Kitchen. a feeling of a lump in the throat  . difficulty swallowing . a problem with too much nose and throat drainage.  . Some people will feel their esophagus spasm which feels like their heart beating hard and fast, this will usually be after a meal, at rest, or lying down at night.    How do I treat this? Treatment for  LPR should be individualized, and your doctor will suggest the best treatment for you. Generally there are several treatments for LPR: . changing habits and diet to reduce reflux,  . medications to reduce stomach acid, and  . surgery to prevent reflux. Most people with LPR need to modify how and when they eat, as well as take some medication, to get well. Sometimes, nonprescription liquid antacids, such as Maalox, Gelucil and Mylanta are recommended. When used, these antacids should be taken four times each day - one tablespoon one hour after each meal and before bedtime. Dietary and lifestyle changes alone are not often enough to control LPR - medications that reduce stomach acid are also usually needed. These must be prescribed by our doctor.   TIPS FOR REDUCING REFLUX AND LPR Control your LIFE-STYLE and your DIET! Marland Kitchen. If you use tobacco, QUIT.  Marland Kitchen. Smoking makes you reflux. After every cigarette you have some LPR.  . Don't wear clothing that is too tight, especially around the waist (trousers, corsets, belts).  . Do not lie down just after eating...in fact, do not eat within three hours of bedtime.  . You should be on a low-fat diet.  . Limit your intake of red meat.  . Limit your intake of butter.  Marland Kitchen. Avoid fried foods.  . Avoid chocolate  . Avoid cheese.  Marland Kitchen. Avoid eggs. Marland Kitchen. Specifically  avoid caffeine (especially coffee and tea), soda pop (especially cola) and mints.  . Avoid alcoholic beverages, particularly in the evening.  INFORMATION ABOUT TOPICAL TOE FUNGUS TREATMENT  Nail fungus is very common and very difficult to treat. Sometime if caught early topical treatment can work. You can try terbinafine over the counter, apply twice daily for 1-3 months to your toenails. If this does not work there is a once a day oral medicine that you have to take for 3 months. Sometime this medication may have to be repeated. It is processed through your liver so we often ask patients to come back for a  liver function test and to report any AB pain, yellowing of the skin, or fluid retention. If the topical treatment does not work for you we can start you on the pill.      When it comes to diets, agreement about the perfect plan isn't easy to find, even among the experts. Experts at the [employer] developed an idea known as the Healthy Eating Plate. Just imagine a plate divided into logical, healthy portions.  The emphasis is on diet quality:  Load up on vegetables and fruits - one-half of your plate: Aim for color and variety, and remember that potatoes don't count.  Go for whole grains - one-quarter of your plate: Whole wheat, barley, wheat berries, quinoa, oats, brown rice, and foods made with them. If you want pasta, go with whole wheat pasta.  Protein power - one-quarter of your plate: Fish, chicken, beans, and nuts are all healthy, versatile protein sources. Limit red meat.  The diet, however, does go beyond the plate, offering a few other suggestions.  Use healthy plant oils, such as olive, canola, soy, corn, sunflower and peanut. Check the labels, and avoid partially hydrogenated oil, which have unhealthy trans fats.  If you're thirsty, drink water. Coffee and tea are good in moderation, but skip sugary drinks and limit milk and dairy products to one or two daily servings.  The type of carbohydrate in the diet is more important than the amount. Some sources of carbohydrates, such as vegetables, fruits, whole grains, and beans-are healthier than others.  Finally, stay active.

## 2019-04-06 ENCOUNTER — Other Ambulatory Visit: Payer: Self-pay | Admitting: Adult Health

## 2019-04-06 DIAGNOSIS — R600 Localized edema: Secondary | ICD-10-CM

## 2019-04-06 LAB — HEMOGLOBIN A1C
Hgb A1c MFr Bld: 5.7 % of total Hgb — ABNORMAL HIGH (ref <5.7)
Mean Plasma Glucose: 117 (calc)
eAG (mmol/L): 6.5 (calc)

## 2019-04-06 LAB — COMPLETE METABOLIC PANEL WITH GFR
AG Ratio: 1.8 (calc) (ref 1.0–2.5)
ALT: 27 U/L (ref 9–46)
AST: 22 U/L (ref 10–35)
Albumin: 4.2 g/dL (ref 3.6–5.1)
Alkaline phosphatase (APISO): 55 U/L (ref 35–144)
BUN: 21 mg/dL (ref 7–25)
CO2: 27 mmol/L (ref 20–32)
Calcium: 9.6 mg/dL (ref 8.6–10.3)
Chloride: 104 mmol/L (ref 98–110)
Creat: 1.1 mg/dL (ref 0.70–1.25)
GFR, Est African American: 80 mL/min/{1.73_m2} (ref >=60)
GFR, Est Non African American: 69 mL/min/{1.73_m2} (ref >=60)
Globulin: 2.4 g/dL (calc) (ref 1.9–3.7)
Glucose, Bld: 116 mg/dL — ABNORMAL HIGH (ref 65–99)
Potassium: 4.2 mmol/L (ref 3.5–5.3)
Sodium: 139 mmol/L (ref 135–146)
Total Bilirubin: 0.7 mg/dL (ref 0.2–1.2)
Total Protein: 6.6 g/dL (ref 6.1–8.1)

## 2019-04-06 LAB — CBC WITH DIFFERENTIAL/PLATELET
Absolute Monocytes: 500 cells/uL (ref 200–950)
Basophils Absolute: 31 cells/uL (ref 0–200)
Basophils Relative: 0.5 %
Eosinophils Absolute: 201 cells/uL (ref 15–500)
Eosinophils Relative: 3.3 %
HCT: 46 % (ref 38.5–50.0)
Hemoglobin: 15.9 g/dL (ref 13.2–17.1)
Lymphs Abs: 1653 cells/uL (ref 850–3900)
MCH: 31.9 pg (ref 27.0–33.0)
MCHC: 34.6 g/dL (ref 32.0–36.0)
MCV: 92.2 fL (ref 80.0–100.0)
MPV: 10.5 fL (ref 7.5–12.5)
Monocytes Relative: 8.2 %
Neutro Abs: 3715 cells/uL (ref 1500–7800)
Neutrophils Relative %: 60.9 %
Platelets: 209 10*3/uL (ref 140–400)
RBC: 4.99 10*6/uL (ref 4.20–5.80)
RDW: 12.6 % (ref 11.0–15.0)
Total Lymphocyte: 27.1 %
WBC: 6.1 10*3/uL (ref 3.8–10.8)

## 2019-04-06 LAB — IRON, TOTAL/TOTAL IRON BINDING CAP
%SAT: 27 % (calc) (ref 20–48)
Iron: 87 ug/dL (ref 50–180)
TIBC: 320 mcg/dL (calc) (ref 250–425)

## 2019-04-06 LAB — LIPID PANEL
Cholesterol: 157 mg/dL (ref <200)
HDL: 44 mg/dL (ref >=40)
LDL Cholesterol (Calc): 94 mg/dL (calc)
Non-HDL Cholesterol (Calc): 113 mg/dL (calc) (ref <130)
Total CHOL/HDL Ratio: 3.6 (calc) (ref <5.0)
Triglycerides: 94 mg/dL (ref <150)

## 2019-04-06 LAB — VITAMIN B12: Vitamin B-12: 650 pg/mL (ref 200–1100)

## 2019-04-06 LAB — TSH: TSH: 2.96 mIU/L (ref 0.40–4.50)

## 2019-04-06 LAB — MAGNESIUM: Magnesium: 2.1 mg/dL (ref 1.5–2.5)

## 2019-04-06 LAB — VITAMIN D 25 HYDROXY (VIT D DEFICIENCY, FRACTURES): Vit D, 25-Hydroxy: 86 ng/mL (ref 30–100)

## 2019-05-06 ENCOUNTER — Other Ambulatory Visit: Payer: Self-pay | Admitting: Adult Health Nurse Practitioner

## 2019-05-06 DIAGNOSIS — I10 Essential (primary) hypertension: Secondary | ICD-10-CM

## 2019-05-07 ENCOUNTER — Other Ambulatory Visit: Payer: Self-pay

## 2019-05-07 DIAGNOSIS — I824Y1 Acute embolism and thrombosis of unspecified deep veins of right proximal lower extremity: Secondary | ICD-10-CM

## 2019-05-07 MED ORDER — RIVAROXABAN 20 MG PO TABS: ORAL_TABLET | ORAL | 0 refills | Status: DC

## 2019-05-07 NOTE — Telephone Encounter (Signed)
Patient's insurance has requested XARELTO be filled for 65mths instead of 50mth.

## 2019-05-10 ENCOUNTER — Other Ambulatory Visit: Payer: Self-pay | Admitting: Adult Health

## 2019-07-11 NOTE — Progress Notes (Signed)
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 STOP aspirin therapy Start Xarelto 15mg  BID, then 20mg  daily for 21 days then 20mg  once daily for 22 days. No strenuous activity Chest X-ray 2view -Never went after initial diagnosis.  Addendum: clear chest xray   Follow up in once month for CPE. Consider coagulation work up related to unprovoked DVT & reoccurrence.   Further disposition pending results of labs. Discussed med's effects and SE's.   Over 30 minutes of interview exam, counseling, chart review, and critical decision making was performed.   Future Appointments  Date Time Provider Department Center  07/12/2019 11:15 AM , NP [facility] None  07/19/2019 10:00 AM , MD [facility] None  08/01/2020 10:00 AM Elder Negus, MD [facility] None    ------------------------------------------------------------------------------------------------------------------   HPI 68 y.o.male presents for evaluation of Right leg.  He had DVT back on 03/19/19 and completed three month course of xeralto 20mg  daily.  He is having swelling and pain in calf and this is now starting to impair his walking.  It is red, warm to touch and he has noticed his right calf is larger than the left.  He reports that he does a lot of standin and walking at work.  He has not been exercising as much as he has in the past.  Reports his diet has been worse than normal including increase in fast foods.  Reports he drinks liquids all during the day but not quite 80oz  daily.  He mainly drinks water and tea.   Past Medical History:  Diagnosis Date  . Allergic rhinitis   . BPH (benign prostatic hyperplasia)   . GERD (gastroesophageal reflux disease)   . Hyperlipidemia   . Hypertension   . Hypogonadism, male   . Kidney stones   . Morbid obesity (HCC)   . Pre-diabetes   . Vitamin D deficiency      Allergies  Allergen Reactions  . Penicillins     REACTION: unknown    Current Outpatient Medications on File Prior to Visit  Medication Sig  . B Complex-C (SUPER B COMPLEX PO) Take 1 tablet by mouth daily.  . bisoprolol-hydrochlorothiazide (ZIAC) 5-6.25 MG tablet Take 1 tablet Daily for BP  . Calcium Carbonate Antacid (TUMS PO) Take by mouth.  . Cholecalciferol (VITAMIN D-3) 5000 UNITS TABS Take 10,000 Units by mouth daily.  . famotidine (PEPCID) 20 MG tablet Take 1 tablet 2 x /day with meals for Acid Indigestion & Reflux  . fluticasone (FLONASE) 50 MCG/ACT nasal spray SPRAY 2 SPRAYS INTO EACH NOSTRIL EVERY DAY  . furosemide (LASIX) 40 MG tablet Take 1 tab daily as needed for edema.  [name] loratadine (CLARITIN) 10 MG tablet Take 10 mg by mouth daily.  . montelukast (SINGULAIR) 10 MG tablet Take 1 tablet daily for Allergies  . rivaroxaban (XARELTO) 20 MG TABS tablet Initiate after completion of initial starter pack, take for total of 3 months. Do not take with aspirin.   No current facility-administered medications on file prior to visit.     ROS: all negative except above.   Physical Exam:  There were no vitals taken for this  visit.  General Appearance: Well nourished, in no apparent distress. Eyes: PERRLA, EOMs, conjunctiva no swelling or erythema Sinuses: No Frontal/maxillary tenderness ENT/Mouth: Ext aud canals clear, TMs without erythema, bulging. No erythema, swelling, or exudate on post pharynx.  Tonsils not swollen or erythematous. Hearing normal.  Neck: Supple, thyroid normal.  Respiratory: Respiratory effort normal, BS equal bilaterally  without rales, rhonchi, wheezing or stridor.  Cardio: RRR with no MRGs. Brisk peripheral pulses without edema.  Abdomen: Soft, + BS.  Non tender, no guarding, rebound, hernias, masses. Lymphatics: Non tender without lymphadenopathy.  Musculoskeletal: Full ROM, 5/5 strength, antalgic gait right.  Right calf posterior erythema, tender to light touch and warm. Positive Homan's sign. Skin: Warm, dry without rashes, lesions, ecchymosis.  Neuro: Cranial nerves intact. Normal muscle tone, no cerebellar symptoms. Sensation intact.  Psych: Awake and oriented X 3, normal affect, Insight and Judgment appropriate.     Garnet Sierras, NP 8:09 PM [facility]

## 2019-07-12 ENCOUNTER — Encounter: Payer: Self-pay | Admitting: Adult Health Nurse Practitioner

## 2019-07-12 ENCOUNTER — Ambulatory Visit
Admission: RE | Admit: 2019-07-12 | Discharge: 2019-07-12 | Disposition: A | Discharge status: Home / Self Care | Payer: BC Managed Care – PPO | Source: Ambulatory Visit | Attending: Adult Health Nurse Practitioner | Admitting: Adult Health Nurse Practitioner

## 2019-07-12 ENCOUNTER — Ambulatory Visit: Payer: BC Managed Care – PPO | Admitting: Adult Health Nurse Practitioner

## 2019-07-12 ENCOUNTER — Other Ambulatory Visit: Payer: Self-pay

## 2019-07-12 VITALS — BP 122/78 | HR 73 | Temp 98.1°F | Wt 249.0 lb

## 2019-07-12 DIAGNOSIS — R0602 Shortness of breath: Secondary | ICD-10-CM | POA: Diagnosis not present

## 2019-07-12 DIAGNOSIS — I10 Essential (primary) hypertension: Secondary | ICD-10-CM

## 2019-07-12 DIAGNOSIS — E782 Mixed hyperlipidemia: Secondary | ICD-10-CM

## 2019-07-12 DIAGNOSIS — I824Y1 Acute embolism and thrombosis of unspecified deep veins of right proximal lower extremity: Secondary | ICD-10-CM

## 2019-07-12 DIAGNOSIS — R7309 Other abnormal glucose: Secondary | ICD-10-CM | POA: Diagnosis not present

## 2019-07-12 NOTE — Patient Instructions (Addendum)
Go for Chest x-ray today!  STOP baby aspirin today.  RESTART Xarelto 15mg  tablets, one tablet BID by mouth  30 days.  THEN take 20mg  once a day for 60days.  No strenuous activity or standing for excessive periods of time.    Any shortness of breath, chest pains, blurry vision headaches not relived by over the counter medication please seek immediate attention.    Deep Vein Thrombosis  Deep vein thrombosis (DVT) is a condition in which a blood clot forms in a deep vein, such as a lower leg, thigh, or arm vein. A clot is blood that has thickened into a gel or solid. This condition is dangerous. It can lead to serious and even life-threatening complications if the clot travels to the lungs and causes a blockage (pulmonary embolism). It can also damage veins in the leg. This can result in leg pain, swelling, discoloration, and sores (post-thrombotic syndrome). What are the causes? This condition may be caused by:  A slowdown of blood flow.  Damage to a vein.  A condition that causes blood to clot more easily, such as an inherited clotting disorder. What increases the risk? The following factors may make you more likely to develop this condition:  Being overweight.  Being older, especially over age 19.  Sitting or lying down for more than four hours.  Being in the hospital.  Lack of physical activity (sedentary lifestyle).  Pregnancy, being in childbirth, or having recently given birth.  Taking medicines that contain estrogen, such as medicines to prevent pregnancy.  Smoking.  A history of any of the following: ? Blood clots or a blood clotting disease. ? Peripheral vascular disease. ? Inflammatory bowel disease. ? Cancer. ? Heart disease. ? Genetic conditions that affect how your blood clots, such as Factor V Leiden mutation. ? Neurological diseases that affect your legs (leg paresis). ? A recent injury, such as a car accident. ? Major or lengthy surgery. ? A central  line placed inside a large vein. What are the signs or symptoms? Symptoms of this condition include:  Swelling, pain, or tenderness in an arm or leg.  Warmth, redness, or discoloration in an arm or leg. If the clot is in your leg, symptoms may be more noticeable or worse when you stand or walk. Some people may not develop any symptoms. How is this diagnosed? This condition is diagnosed with:  A medical history and physical exam.  Tests, such as: ? Blood tests. These are done to check how well your blood clots. ? Ultrasound. This is done to check for clots. ? Venogram. For this test, contrast dye is injected into a vein and X-rays are taken to check for any clots. How is this treated? Treatment for this condition depends on:  The cause of your DVT.  Your risk for bleeding or developing more clots.  Any other medical conditions that you have. Treatment may include:  Taking a blood thinner (anticoagulant). This type of medicine prevents clots from forming. It may be taken by mouth, injected under the skin, or injected through an IV (catheter).  Injecting clot-dissolving medicines into the affected vein (catheter-directed thrombolysis).  Having surgery. Surgery may be done to: ? Remove the clot. ? Place a filter in a large vein to catch blood clots before they reach the lungs. Some treatments may be continued for up to six months. Follow these instructions at home: If you are taking blood thinners:  Take the medicine exactly as told by your health care  provider. Some blood thinners need to be taken at the same time every day. Do not skip a dose.  Talk with your health care provider before you take any medicines that contain aspirin or NSAIDs. These medicines increase your risk for dangerous bleeding.  Ask your health care provider about foods and drugs that could change the way the medicine works (may interact). Avoid those things if your health care provider tells you to do so.   Blood thinners can cause easy bruising and may make it difficult to stop bleeding. Because of this: ? Be very careful when using knives, scissors, or other sharp objects. ? Use an electric razor instead of a blade. ? Avoid activities that could cause injury or bruising, and follow instructions about how to prevent falls.  Wear a medical alert bracelet or carry a card that lists what medicines you take. General instructions  Take over-the-counter and prescription medicines only as told by your health care provider.  Return to your normal activities as told by your health care provider. Ask your health care provider what activities are safe for you.  Wear compression stockings if recommended by your health care provider.  Keep all follow-up visits as told by your health care provider. This is important. How is this prevented? To lower your risk of developing this condition again:  For 30 or more minutes every day, do an activity that: ? Involves moving your arms and legs. ? Increases your heart rate.  When traveling for longer than four hours: ? Exercise your arms and legs every hour. ? Drink plenty of water. ? Avoid drinking alcohol.  Avoid sitting or lying for a long time without moving your legs.  If you have surgery or you are hospitalized, ask about ways to prevent blood clots. These may include taking frequent walks or using anticoagulants.  Stay at a healthy weight.  If you are a woman who is older than age 68, avoid unnecessary use of medicines that contain estrogen, such as some birth control pills.  Do not use any products that contain nicotine or tobacco, such as cigarettes and e-cigarettes. This is especially important if you take estrogen medicines. If you need help quitting, ask your health care provider. Contact a health care provider if:  You miss a dose of your blood thinner.  Your menstrual period is heavier than usual.  You have unusual bruising. Get  help right away if:  You have: ? New or increased pain, swelling, or redness in an arm or leg. ? Numbness or tingling in an arm or leg. ? Shortness of breath. ? Chest pain. ? A rapid or irregular heartbeat. ? A severe headache or confusion. ? A cut that will not stop bleeding.  There is blood in your vomit, stool, or urine.  You have a serious fall or accident, or you hit your head.  You feel light-headed or dizzy.  You cough up blood. These symptoms may represent a serious problem that is an emergency. Do not wait to see if the symptoms will go away. Get medical help right away. Call your local emergency services (911 in the [place] Do not drive yourself to the hospital. Summary  Deep vein thrombosis (DVT) is a condition in which a blood clot forms in a deep vein, such as a lower leg, thigh, or arm vein.  Symptoms can include swelling, warmth, pain, and redness in your leg or arm.  This condition may be treated with a blood thinner (anticoagulant medicine),  medicine that is injected to dissolve blood clots,compression stockings, or surgery.  If you are prescribed blood thinners, take them exactly as told. This information is not intended to replace advice given to you by your health care provider. Make sure you discuss any questions you have with your health care provider. Document Released: 09/13/2005 Document Revised: 08/26/2017 Document Reviewed: 02/11/2017 Elsevier Patient Education  2020 [employer].

## 2019-07-16 ENCOUNTER — Encounter: Payer: Self-pay | Admitting: Adult Health Nurse Practitioner

## 2019-07-18 ENCOUNTER — Encounter: Payer: Self-pay | Admitting: Internal Medicine

## 2019-07-18 NOTE — Progress Notes (Signed)
Annual  Screening/Preventative Visit  & Comprehensive Evaluation & Examination     This very nice 68 y.o. MWM  presents for a Screening /Preventative Visit & comprehensive evaluation and management of multiple medical co-morbidities.  Patient has been followed for HTN, HLD, Prediabetes and Vitamin D Deficiency.      Patient was treated for a RLE DVT in June 2020 on Xarelto x 3 months thru Sept and re-presented 10/15 with recurrent erythema , RLE swelling & (+) Rt Homans sign & was restarted on Xarelto.      HTN predates since     . Patient's BP has been controlled at home.  Today's BP is at goal - 116/84. Patient denies any cardiac symptoms as chest pain, palpitations, shortness of breath, dizziness or ankle swelling.     Patient's hyperlipidemia is controlled with diet and medications. Patient denies myalgias or other medication SE's. Last lipids were at goal:  Lab Results  Component Value Date   CHOL 157 04/05/2019   HDL 44 04/05/2019   LDLCALC 94 04/05/2019   TRIG 94 04/05/2019   CHOLHDL 3.6 04/05/2019      Patient has hx/o prediabetes since    and patient denies reactive hypoglycemic symptoms, visual blurring, diabetic polys or paresthesias. Last A1c was near goal:  Lab Results  Component Value Date   HGBA1C 5.7 (H) 04/05/2019       Finally, patient has history of Vitamin D Deficiency of    and last vitamin D was at goal:  Lab Results  Component Value Date   VD25OH 86 04/05/2019   Current Outpatient Medications on File Prior to Visit  Medication Sig   B Complex-C (SUPER B COMPLEX PO) Take 1 tablet by mouth daily.   bisoprolol-hydrochlorothiazide (ZIAC) 5-6.25 MG tablet Take 1 tablet Daily for BP   Calcium Carbonate Antacid (TUMS PO) Take by mouth.   Cholecalciferol (VITAMIN D-3) 5000 UNITS TABS Take 10,000 Units by mouth daily.   famotidine (PEPCID) 20 MG tablet Take 1 tablet 2 x /day with meals for Acid Indigestion & Reflux   fluticasone (FLONASE) 50 MCG/ACT  nasal spray SPRAY 2 SPRAYS INTO EACH NOSTRIL EVERY DAY   furosemide (LASIX) 40 MG tablet Take 1 tab daily as needed for edema.   loratadine (CLARITIN) 10 MG tablet Take 10 mg by mouth daily.   montelukast (SINGULAIR) 10 MG tablet Take 1 tablet daily for Allergies   rivaroxaban (XARELTO) 20 MG TABS tablet Initiate after completion of initial starter pack, take for total of 3 months. Do not take with aspirin.   No current facility-administered medications on file prior to visit.    Allergies  Allergen Reactions   Penicillins     REACTION: unknown   Past Medical History:  Diagnosis Date   Allergic rhinitis    BPH (benign prostatic hyperplasia)    GERD (gastroesophageal reflux disease)    Hyperlipidemia    Hypertension    Hypogonadism, male    Kidney stones    Morbid obesity (HCC)    Pre-diabetes    Vitamin D deficiency    Health Maintenance  Topic Date Due   Hepatitis C Screening  07/14/1951   COLONOSCOPY  02/15/2018   TETANUS/TDAP  09/06/2024   INFLUENZA VACCINE  Completed   PNA vac Low Risk Adult  Completed   Immunization History  Administered Date(s) Administered   DTaP 01/26/2000   Influenza Whole 06/22/2013   Influenza, High Dose Seasonal PF 06/27/2015, 06/27/2017, 06/20/2018   Influenza-Unspecified 07/13/2014, 06/26/2016, 05/13/2019  PPD Test 05/29/2014, 07/30/2015   Pneumococcal Conjugate-13 05/13/2017   Pneumococcal Polysaccharide-23 06/20/2018   Tdap 09/06/2014   Zoster 04/22/2013   Last Colon -  02/16/2008 - Dr Henrene Pastor - 80 yr f/u overdue May 2019   Past Surgical History:  Procedure Laterality Date   FLEXIBLE SIGMOIDOSCOPY  1992   KNEE SURGERY Right 1977   TONSILLECTOMY AND ADENOIDECTOMY  1960   Family History  Problem Relation Age of Onset   Cancer Father        lung   Diabetes Father    Diabetes Brother    Social History   Socioeconomic History   Marital status: Married    Spouse name: Not on file   Number  of children: 2  Occupational History   Not on file  Tobacco Use   Smoking status: Former Smoker    Quit date: 09/28/1975    Years since quitting: 43.8   Smokeless tobacco: Never Used  Substance and Sexual Activity   Alcohol use: Yes    Comment: occasional   Drug use: No   Sexual activity: Not on file    ROS Constitutional: Denies fever, chills, weight loss/gain, headaches, insomnia,  night sweats or change in appetite. Does c/o fatigue. Eyes: Denies redness, blurred vision, diplopia, discharge, itchy or watery eyes.  ENT: Denies discharge, congestion, post nasal drip, epistaxis, sore throat, earache, hearing loss, dental pain, Tinnitus, Vertigo, Sinus pain or snoring.  Cardio: Denies chest pain, palpitations, irregular heartbeat, syncope, dyspnea, diaphoresis, orthopnea, PND, claudication or edema Respiratory: denies cough, dyspnea, DOE, pleurisy, hoarseness, laryngitis or wheezing.  Gastrointestinal: Denies dysphagia, heartburn, reflux, water brash, pain, cramps, nausea, vomiting, bloating, diarrhea, constipation, hematemesis, melena, hematochezia, jaundice or hemorrhoids Genitourinary: Denies dysuria, frequency, urgency, nocturia, hesitancy, discharge, hematuria or flank pain Musculoskeletal: Denies arthralgia, myalgia, stiffness, Jt. Swelling, pain, limp or strain/sprain. Denies Falls. Skin: Denies puritis, rash, hives, warts, acne, eczema or change in skin lesion Neuro: No weakness, tremor, incoordination, spasms, paresthesia or pain Psychiatric: Denies confusion, memory loss or sensory loss. Denies Depression. Endocrine: Denies change in weight, skin, hair change, nocturia, and paresthesia, diabetic polys, visual blurring or hyper / hypo glycemic episodes.  Heme/Lymph: No excessive bleeding, bruising or enlarged lymph nodes.  Physical Exam  BP 116/84    Pulse (!) 56    Temp (!) 97 F (36.1 C)    Resp 16    Ht 5\' 10"  (1.778 m)    Wt 248 lb (112.5 kg)    BMI 35.58 kg/m    General Appearance: Well nourished and well groomed and in no apparent distress.  Eyes: PERRLA, EOMs, conjunctiva no swelling or erythema, normal fundi and vessels. Sinuses: No frontal/maxillary tenderness ENT/Mouth: EACs patent / TMs  nl. Nares clear without erythema, swelling, mucoid exudates. Oral hygiene is good. No erythema, swelling, or exudate. Tongue normal, non-obstructing. Tonsils not swollen or erythematous. Hearing normal.  Neck: Supple, thyroid not palpable. No bruits, nodes or JVD. Respiratory: Respiratory effort normal.  BS equal and clear bilateral without rales, rhonci, wheezing or stridor. Cardio: Heart sounds are normal with regular rate and rhythm and no murmurs, rubs or gallops. Peripheral pulses are normal and equal bilaterally without edema. No aortic or femoral bruits. Chest: symmetric with normal excursions and percussion.  Abdomen: Soft, with Nl bowel sounds. Nontender, no guarding, rebound, hernias, masses, or organomegaly.  Lymphatics: Non tender without lymphadenopathy.  Musculoskeletal: Full ROM all peripheral extremities, joint stability, 5/5 strength, and normal gait. Skin: Warm and dry without rashes, lesions, cyanosis,  clubbing or  ecchymosis.  Neuro: Cranial nerves intact, reflexes equal bilaterally. Normal muscle tone, no cerebellar symptoms. Sensation intact.  Pysch: Alert and oriented X 3 with normal affect, insight and judgment appropriate.   Assessment and Plan  1. Annual Preventative/Screening Exam        Patient was counseled in prudent diet, weight control to achieve/maintain BMI less than 25, BP monitoring, regular exercise and medications as discussed.  Discussed med effects and SE's. Routine screening labs and tests as requested with regular follow-up as recommended. Over 40 minutes of exam, counseling, chart review and high complex critical decision making was performed   Alex MawWilliam D Jabe Jeanbaptiste, MD

## 2019-07-18 NOTE — Patient Instructions (Signed)
Vit D  & Vit C 1,000 mg   are recommended to help protect  against the Covid-19 and other Corona viruses.    Also it's recommended  to take  Zinc 50 mg  to help  protect against the Covid-19   and best place to get  is also on [employer]  and don't pay more than 6-8 cents /pill !  ================================ Coronavirus (COVID-19) Are you at risk?  Are you at risk for the Coronavirus (COVID-19)?  To be considered HIGH RISK for Coronavirus (COVID-19), you have to meet the following criteria:  . Traveled to [place], [place], [place], [place] or [place]; or in the [place] to [place], [place], [place]  . or [place]; and have fever, cough, and shortness of breath within the last 2 weeks of travel OR . Been in close contact with a person diagnosed with COVID-19 within the last 2 weeks and have  . fever, cough,and shortness of breath .  . IF YOU DO NOT MEET THESE CRITERIA, YOU ARE CONSIDERED LOW RISK FOR COVID-19.  What to do if you are HIGH RISK for COVID-19?  . If you are having a medical emergency, call 911. . Seek medical care right away. Before you go to a doctor's office, urgent care or emergency department, .  call ahead and tell them about your recent travel, contact with someone diagnosed with COVID-19  .  and your symptoms.  . You should receive instructions from your physician's office regarding next steps of care.  . When you arrive at healthcare provider, tell the healthcare staff immediately you have returned from  . visiting [place], [place], [place], [place] or [place]; or traveled in the [place] to [place], [place],  . [place] or [place] in the last two weeks or you have been in close contact with a person diagnosed with  . COVID-19 in the last 2 weeks.   . Tell the health care staff about your symptoms: fever, cough and shortness of breath. . After you have been seen by a medical provider, you will be either: o Tested for (COVID-19) and  discharged home on quarantine except to seek medical care if  o symptoms worsen, and asked to  - Stay home and avoid contact with others until you get your results (4-5 days)  - Avoid travel on public transportation if possible (such as bus, train, or airplane) or o Sent to the Emergency Department by EMS for evaluation, COVID-19 testing  and  o possible admission depending on your condition and test results.  What to do if you are LOW RISK for COVID-19?  Reduce your risk of any infection by using the same precautions used for avoiding the common cold or flu:  . Wash your hands often with soap and warm water for at least 20 seconds.  If soap and water are not readily available,  . use an alcohol-based hand sanitizer with at least 60% alcohol.  . If coughing or sneezing, cover your mouth and nose by coughing or sneezing into the elbow areas of your shirt or coat, .  into a tissue or into your sleeve (not your hands). . Avoid shaking hands with others and consider head nods or verbal greetings only. . Avoid touching your eyes, nose, or mouth with unwashed hands.  . Avoid close contact with people who are sick. . Avoid places or events with large numbers of people in one location, like concerts or sporting events. .   Carefully consider travel plans you have or are making. . If you are planning any travel outside or inside the [place], visit the CDC's Travelers' Health webpage for the latest health notices. . If you have some symptoms but not all symptoms, continue to monitor at home and seek medical attention  . if your symptoms worsen. . If you are having a medical emergency, call 911. >>>>>>>>>>>>>>>>>>>>>>>>>>>>>>>>>>>>>>>>>>>>>>>>>>>>>>> We Do NOT Approve of  [facility] [place] Soliciting Our Patients  To Do Home Visits  & We Do NOT Approve of [employer] > > > > > > > > > > > > > > > > > > > > > > > > > > > > > > > > > > >  > > > >   Preventive Care for Adults  A  healthy lifestyle and preventive care can promote health and wellness. Preventive health guidelines for men include the following key practices:  A routine yearly physical is a good way to check with your health care provider about your health and preventative screening. It is a chance to share any concerns and updates on your health and to receive a thorough exam.  Visit your dentist for a routine exam and preventative care every 6 months. Brush your teeth twice a day and floss once a day. Good oral hygiene prevents tooth decay and gum disease.  The frequency of eye exams is based on your age, health, family medical history, use of contact lenses, and other factors. Follow your health care provider's recommendations for frequency of eye exams.  Eat a healthy diet. Foods such as vegetables, fruits, whole grains, low-fat dairy products, and lean protein foods contain the nutrients you need without too many calories. Decrease your intake of foods high in solid fats, added sugars, and salt. Eat the right amount of calories for you. Get information about a proper diet from your health care provider, if necessary.  Regular physical exercise is one of the most important things you can do for your health. Most adults should get at least 150 minutes of moderate-intensity exercise (any activity that increases your heart rate and causes you to sweat) each week. In addition, most adults need muscle-strengthening exercises on 2 or more days a week.  Maintain a healthy weight. The body mass index (BMI) is a screening tool to identify possible weight problems. It provides an estimate of body fat based on height and weight. Your health care provider can find your BMI and can help you achieve or maintain a healthy weight. For adults 20 years and older:  A BMI below 18.5 is considered underweight.  A BMI of 18.5 to 24.9 is normal.  A BMI of 25 to 29.9 is considered overweight.  A BMI of 30 and above is considered  obese.  Maintain normal blood lipids and cholesterol levels by exercising and minimizing your intake of saturated fat. Eat a balanced diet with plenty of fruit and vegetables. Blood tests for lipids and cholesterol should begin at age 20 and be repeated every 5 years. If your lipid or cholesterol levels are high, you are over 50, or you are at high risk for heart disease, you may need your cholesterol levels checked more frequently. Ongoing high lipid and cholesterol levels should be treated with medicines if diet and exercise are not working.  If you smoke, find out from your health care provider how to quit. If you do not use tobacco, do not start.  Lung cancer   screening is recommended for adults aged 55-80 years who are at high risk for developing lung cancer because of a history of smoking. A yearly low-dose CT scan of the lungs is recommended for people who have at least a 30-pack-year history of smoking and are a current smoker or have quit within the past 15 years. A pack year of smoking is smoking an average of 1 pack of cigarettes a day for 1 year (for example: 1 pack a day for 30 years or 2 packs a day for 15 years). Yearly screening should continue until the smoker has stopped smoking for at least 15 years. Yearly screening should be stopped for people who develop a health problem that would prevent them from having lung cancer treatment.  If you choose to drink alcohol, do not have more than 2 drinks per day. One drink is considered to be 12 ounces (355 mL) of beer, 5 ounces (148 mL) of wine, or 1.5 ounces (44 mL) of liquor.  Avoid use of street drugs. Do not share needles with anyone. Ask for help if you need support or instructions about stopping the use of drugs.  High blood pressure causes heart disease and increases the risk of stroke. Your blood pressure should be checked at least every 1-2 years. Ongoing high blood pressure should be treated with medicines, if weight loss and exercise  are not effective.  If you are 45-79 years old, ask your health care provider if you should take aspirin to prevent heart disease.  Diabetes screening involves taking a blood sample to check your fasting blood sugar level. Testing should be considered at a younger age or be carried out more frequently if you are overweight and have at least 1 risk factor for diabetes.  Colorectal cancer can be detected and often prevented. Most routine colorectal cancer screening begins at the age of 50 and continues through age 75. However, your health care provider may recommend screening at an earlier age if you have risk factors for colon cancer. On a yearly basis, your health care provider may provide home test kits to check for hidden blood in the stool. Use of a small camera at the end of a tube to directly examine the colon (sigmoidoscopy or colonoscopy) can detect the earliest forms of colorectal cancer. Talk to your health care provider about this at age 50, when routine screening begins. Direct exam of the colon should be repeated every 5-10 years through age 75, unless early forms of precancerous polyps or small growths are found.  Hepatitis C blood testing is recommended for all people born from 1945 through 1965 and any individual with known risks for hepatitis C.  Screening for abdominal aortic aneurysm (AAA)  by ultrasound is recommended for people who have history of high blood pressure or who are current or former smokers.  Healthy men should  receive prostate-specific antigen (PSA) blood tests as part of routine cancer screening. Talk with your health care provider about prostate cancer screening.  Testicular cancer screening is  recommended for adult males. Screening includes self-exam, a health care provider exam, and other screening tests. Consult with your health care provider about any symptoms you have or any concerns you have about testicular cancer.  Use sunscreen. Apply sunscreen liberally  and repeatedly throughout the day. You should seek shade when your shadow is shorter than you. Protect yourself by wearing long sleeves, pants, a wide-brimmed hat, and sunglasses year round, whenever you are outdoors.  Once a month,   do a whole-body skin exam, using a mirror to look at the skin on your back. Tell your health care provider about new moles, moles that have irregular borders, moles that are larger than a pencil eraser, or moles that have changed in shape or color.  Stay current with required vaccines (immunizations).  Influenza vaccine. All adults should be immunized every year.  Tetanus, diphtheria, and acellular pertussis (Td, Tdap) vaccine. An adult who has not previously received Tdap or who does not know his vaccine status should receive 1 dose of Tdap. This initial dose should be followed by tetanus and diphtheria toxoids (Td) booster doses every 10 years. Adults with an unknown or incomplete history of completing a 3-dose immunization series with Td-containing vaccines should begin or complete a primary immunization series including a Tdap dose. Adults should receive a Td booster every 10 years.  Zoster vaccine. One dose is recommended for adults aged 60 years or older unless certain conditions are present.    PREVNAR - Pneumococcal 13-valent conjugate (PCV13) vaccine. When indicated, a person who is uncertain of his immunization history and has no record of immunization should receive the PCV13 vaccine. An adult aged 19 years or older who has certain medical conditions and has not been previously immunized should receive 1 dose of PCV13 vaccine. This PCV13 should be followed with a dose of pneumococcal polysaccharide (PPSV23) vaccine. The PPSV23 vaccine dose should be obtained 1 or more year(s)after the dose of PCV13 vaccine. An adult aged 19 years or older who has certain medical conditions and previously received 1 or more doses of PPSV23 vaccine should receive 1 dose of PCV13.  The PCV13 vaccine dose should be obtained 1 or more years after the last PPSV23 vaccine dose.    PNEUMOVAX - Pneumococcal polysaccharide (PPSV23) vaccine. When PCV13 is also indicated, PCV13 should be obtained first. All adults aged 65 years and older should be immunized. An adult younger than age 65 years who has certain medical conditions should be immunized. Any person who resides in a nursing home or long-term care facility should be immunized. An adult smoker should be immunized. People with an immunocompromised condition and certain other conditions should receive both PCV13 and PPSV23 vaccines. People with human immunodeficiency virus (HIV) infection should be immunized as soon as possible after diagnosis. Immunization during chemotherapy or radiation therapy should be avoided. Routine use of PPSV23 vaccine is not recommended for American Indians, [place] Natives, or people younger than 65 years unless there are medical conditions that require PPSV23 vaccine. When indicated, people who have unknown immunization and have no record of immunization should receive PPSV23 vaccine. One-time revaccination 5 years after the first dose of PPSV23 is recommended for people aged 19-64 years who have chronic kidney failure, nephrotic syndrome, asplenia, or immunocompromised conditions. People who received 1-2 doses of PPSV23 before age 65 years should receive another dose of PPSV23 vaccine at age 65 years or later if at least 5 years have passed since the previous dose. Doses of PPSV23 are not needed for people immunized with PPSV23 at or after age 65 years.    Hepatitis A vaccine. Adults who wish to be protected from this disease, have certain high-risk conditions, work with hepatitis A-infected animals, work in hepatitis A research labs, or travel to or work in countries with a high rate of hepatitis A should be immunized. Adults who were previously unvaccinated and who anticipate close contact with an  international adoptee during the first 60 days after arrival in   the [place] from a country with a high rate of hepatitis A should be immunized.    Hepatitis B vaccine. Adults should be immunized if they wish to be protected from this disease, have certain high-risk conditions, may be exposed to blood or other infectious body fluids, are household contacts or sex partners of hepatitis B positive people, are clients or workers in certain care facilities, or travel to or work in countries with a high rate of hepatitis B.   Preventive Service / Frequency   Ages 65 and over  Blood pressure check.  Lipid and cholesterol check.  Lung cancer screening. / Every year if you are aged 55-80 years and have a 30-pack-year history of smoking and currently smoke or have quit within the past 15 years. Yearly screening is stopped once you have quit smoking for at least 15 years or develop a health problem that would prevent you from having lung cancer treatment.  Fecal occult blood test (FOBT) of stool. You may not have to do this test if you get a colonoscopy every 10 years.  Flexible sigmoidoscopy** or colonoscopy.** / Every 5 years for a flexible sigmoidoscopy or every 10 years for a colonoscopy beginning at age 50 and continuing until age 75.  Hepatitis C blood test.** / For all people born from 1945 through 1965 and any individual with known risks for hepatitis C.  Abdominal aortic aneurysm (AAA) screening./ Screening current or former smokers or have Hypertension.  Skin self-exam. / Monthly.  Influenza vaccine. / Every year.  Tetanus, diphtheria, and acellular pertussis (Tdap/Td) vaccine.** / 1 dose of Td every 10 years.   Zoster vaccine.** / 1 dose for adults aged 60 years or older.         Pneumococcal 13-valent conjugate (PCV13) vaccine.    Pneumococcal polysaccharide (PPSV23) vaccine.     Hepatitis A vaccine.** / Consult your health care provider.  Hepatitis B vaccine.** /  Consult your health care provider. Screening for abdominal aortic aneurysm (AAA)  by ultrasound is recommended for people who have history of high blood pressure or who are current or former smokers. ++++++++++ Recommend Adult Low Dose Aspirin or  coated  Aspirin 81 mg daily  To reduce risk of Colon Cancer 40 %,  Skin Cancer 26 % ,  Malignant Melanoma 46%  and  Pancreatic cancer 60% ++++++++++++++++++++++ Vitamin D goal  is between 70-100.  Please make sure that you are taking your Vitamin D as directed.  It is very important as a natural anti-inflammatory  helping hair, skin, and nails, as well as reducing stroke and heart attack risk.  It helps your bones and helps with mood. It also decreases numerous cancer risks so please take it as directed.  Low Vit D is associated with a 200-300% higher risk for CANCER  and 200-300% higher risk for HEART   ATTACK  &  STROKE.   ...................................... It is also associated with higher death rate at younger ages,  autoimmune diseases like Rheumatoid arthritis, Lupus, Multiple Sclerosis.    Also many other serious conditions, like depression, Alzheimer's Dementia, infertility, muscle aches, fatigue, fibromyalgia - just to name a few. ++++++++++++++++++++++ Recommend the book "The END of DIETING" by Dr Joel Fuhrman  & the book "The END of DIABETES " by Dr Joel Fuhrman At [employer] - get book & Audio CD's    Being diabetic has a  300% increased risk for heart attack, stroke, cancer, and alzheimer- type vascular dementia. It is very important   that you work harder with diet by avoiding all foods that are white. Avoid white rice (brown & wild rice is OK), white potatoes (sweetpotatoes in moderation is OK), White bread or wheat bread or anything made out of white flour like bagels, donuts, rolls, buns, biscuits, cakes, pastries, cookies, pizza crust, and pasta (made from white flour & egg whites) - vegetarian pasta or spinach or wheat  pasta is OK. Multigrain breads like Arnold's or [employer], or multigrain sandwich thins or flatbreads.  Diet, exercise and weight loss can reverse and cure diabetes in the early stages.  Diet, exercise and weight loss is very important in the control and prevention of complications of diabetes which affects every system in your body, ie. Brain - dementia/stroke, eyes - glaucoma/blindness, heart - heart attack/heart failure, kidneys - dialysis, stomach - gastric paralysis, intestines - malabsorption, nerves - severe painful neuritis, circulation - gangrene & loss of a leg(s), and finally cancer and Alzheimers.    I recommend avoid fried & greasy foods,  sweets/candy, white rice (brown or wild rice or Quinoa is OK), white potatoes (sweet potatoes are OK) - anything made from white flour - bagels, doughnuts, rolls, buns, biscuits,white and wheat breads, pizza crust and traditional pasta made of white flour & egg white(vegetarian pasta or spinach or wheat pasta is OK).  Multi-grain bread is OK - like multi-grain flat bread or sandwich thins. Avoid alcohol in excess. Exercise is also important.    Eat all the vegetables you want - avoid meat, especially red meat and dairy - especially cheese.  Cheese is the most concentrated form of trans-fats which is the worst thing to clog up our arteries. Veggie cheese is OK which can be found in the fresh produce section at [employer] or [employer] or [employer]  ++++++++++++++++++++++ DASH Eating Plan  DASH stands for "Dietary Approaches to Stop Hypertension."   The DASH eating plan is a healthy eating plan that has been shown to reduce high blood pressure (hypertension). Additional health benefits may include reducing the risk of type 2 diabetes mellitus, heart disease, and stroke. The DASH eating plan may also help with weight loss. WHAT DO I NEED TO KNOW ABOUT THE DASH EATING PLAN? For the DASH eating plan, you will follow these general  guidelines:  Choose foods with a percent daily value for sodium of less than 5% (as listed on the food label).  Use salt-free seasonings or herbs instead of table salt or sea salt.  Check with your health care provider or pharmacist before using salt substitutes.  Eat lower-sodium products, often labeled as "lower sodium" or "no salt added."  Eat fresh foods.  Eat more vegetables, fruits, and low-fat dairy products.  Choose whole grains. Look for the word "whole" as the first word in the ingredient list.  Choose fish   Limit sweets, desserts, sugars, and sugary drinks.  Choose heart-healthy fats.  Eat veggie cheese   Eat more home-cooked food and less restaurant, buffet, and fast food.  Limit fried foods.  Cook foods using methods other than frying.  Limit canned vegetables. If you do use them, rinse them well to decrease the sodium.  When eating at a restaurant, ask that your food be prepared with less salt, or no salt if possible.                      WHAT FOODS CAN I EAT? Read Dr Joel Fuhrman's books on The End of Dieting &   The End of Diabetes  Grains Whole grain or whole wheat bread. Brown rice. Whole grain or whole wheat pasta. Quinoa, bulgur, and whole grain cereals. Low-sodium cereals. Corn or whole wheat flour tortillas. Whole grain cornbread. Whole grain crackers. Low-sodium crackers.  Vegetables Fresh or frozen vegetables (raw, steamed, roasted, or grilled). Low-sodium or reduced-sodium tomato and vegetable juices. Low-sodium or reduced-sodium tomato sauce and paste. Low-sodium or reduced-sodium canned vegetables.   Fruits All fresh, canned (in natural juice), or frozen fruits.  Protein Products  All fish and seafood.  Dried beans, peas, or lentils. Unsalted nuts and seeds. Unsalted canned beans.  Dairy Low-fat dairy products, such as skim or 1% milk, 2% or reduced-fat cheeses, low-fat ricotta or cottage cheese, or plain low-fat yogurt. Low-sodium or  reduced-sodium cheeses.  Fats and Oils Tub margarines without trans fats. Light or reduced-fat mayonnaise and salad dressings (reduced sodium). Avocado. Safflower, olive, or canola oils. Natural peanut or almond butter.  Other Unsalted popcorn and pretzels. The items listed above may not be a complete list of recommended foods or beverages. Contact your dietitian for more options.  ++++++++++++++++++++  WHAT FOODS ARE NOT RECOMMENDED? Grains/ White flour or wheat flour White bread. White pasta. White rice. Refined cornbread. Bagels and croissants. Crackers that contain trans fat.  Vegetables  Creamed or fried vegetables. Vegetables in a . Regular canned vegetables. Regular canned tomato sauce and paste. Regular tomato and vegetable juices.  Fruits Dried fruits. Canned fruit in light or heavy syrup. Fruit juice.  Meat and Other Protein Products Meat in general - RED meat & White meat.  Fatty cuts of meat. Ribs, chicken wings, all processed meats as bacon, sausage, bologna, salami, fatback, hot dogs, bratwurst and packaged luncheon meats.  Dairy Whole or 2% milk, cream, half-and-half, and cream cheese. Whole-fat or sweetened yogurt. Full-fat cheeses or blue cheese. Non-dairy creamers and whipped toppings. Processed cheese, cheese spreads, or cheese curds.  Condiments Onion and garlic salt, seasoned salt, table salt, and sea salt. Canned and packaged gravies. Worcestershire sauce. Tartar sauce. Barbecue sauce. Teriyaki sauce. Soy sauce, including reduced sodium. Steak sauce. Fish sauce. Oyster sauce. Cocktail sauce. Horseradish. Ketchup and mustard. Meat flavorings and tenderizers. Bouillon cubes. Hot sauce. Tabasco sauce. Marinades. Taco seasonings. Relishes.  Fats and Oils Butter, stick margarine, lard, shortening and bacon fat. Coconut, palm kernel, or palm oils. Regular salad dressings.  Pickles and olives. Salted popcorn and pretzels.  The items listed above may not be a  complete list of foods and beverages to avoid. ++++++++++++++++++++++++++++++   Deep Vein Thrombosis  Deep vein thrombosis (DVT) is a condition in which a blood clot forms in a deep vein, such as a lower leg, thigh, or arm vein. A clot is blood that has thickened into a gel or solid. This condition is dangerous. It can lead to serious and even life-threatening complications if the clot travels to the lungs and causes a blockage (pulmonary embolism). It can also damage veins in the leg. This can result in leg pain, swelling, discoloration, and sores (post-thrombotic syndrome). What are the causes? This condition may be caused by:  A slowdown of blood flow.  Damage to a vein.  A condition that causes blood to clot more easily, such as an inherited clotting disorder. What increases the risk? The following factors may make you more likely to develop this condition:  Being overweight.  Being older, especially over age 38.  Sitting or lying down for more than four hours.  Being in the  hospital.  Lack of physical activity (sedentary lifestyle).  Pregnancy, being in childbirth, or having recently given birth.  Taking medicines that contain estrogen, such as medicines to prevent pregnancy.  Smoking.  A history of any of the following: ? Blood clots or a blood clotting disease. ? Peripheral vascular disease. ? Inflammatory bowel disease. ? Cancer. ? Heart disease. ? Genetic conditions that affect how your blood clots, such as Factor V Leiden mutation. ? Neurological diseases that affect your legs (leg paresis). ? A recent injury, such as a car accident. ? Major or lengthy surgery. ? A central line placed inside a large vein. What are the signs or symptoms? Symptoms of this condition include:  Swelling, pain, or tenderness in an arm or leg.  Warmth, redness, or discoloration in an arm or leg. If the clot is in your leg, symptoms may be more noticeable or worse when you stand  or walk. Some people may not develop any symptoms. How is this diagnosed? This condition is diagnosed with:  A medical history and physical exam.  Tests, such as: ? Blood tests. These are done to check how well your blood clots. ? Ultrasound. This is done to check for clots. ? Venogram. For this test, contrast dye is injected into a vein and X-rays are taken to check for any clots. How is this treated? Treatment for this condition depends on:  The cause of your DVT.  Your risk for bleeding or developing more clots.  Any other medical conditions that you have. Treatment may include:  Taking a blood thinner (anticoagulant). This type of medicine prevents clots from forming. It may be taken by mouth, injected under the skin, or injected through an IV (catheter).  Injecting clot-dissolving medicines into the affected vein (catheter-directed thrombolysis).  Having surgery. Surgery may be done to: ? Remove the clot. ? Place a filter in a large vein to catch blood clots before they reach the lungs. Some treatments may be continued for up to six months. Follow these instructions at home: If you are taking blood thinners:  Take the medicine exactly as told by your health care provider. Some blood thinners need to be taken at the same time every day. Do not skip a dose.  Talk with your health care provider before you take any medicines that contain aspirin or NSAIDs. These medicines increase your risk for dangerous bleeding.  Ask your health care provider about foods and drugs that could change the way the medicine works (may interact). Avoid those things if your health care provider tells you to do so.  Blood thinners can cause easy bruising and may make it difficult to stop bleeding. Because of this: ? Be very careful when using knives, scissors, or other sharp objects. ? Use an electric razor instead of a blade. ? Avoid activities that could cause injury or bruising, and follow  instructions about how to prevent falls.  Wear a medical alert bracelet or carry a card that lists what medicines you take. General instructions  Take over-the-counter and prescription medicines only as told by your health care provider.  Return to your normal activities as told by your health care provider. Ask your health care provider what activities are safe for you.  Wear compression stockings if recommended by your health care provider.  Keep all follow-up visits as told by your health care provider. This is important. How is this prevented? To lower your risk of developing this condition again:  For 30 or more  minutes every day, do an activity that: ? Involves moving your arms and legs. ? Increases your heart rate.  When traveling for longer than four hours: ? Exercise your arms and legs every hour. ? Drink plenty of water. ? Avoid drinking alcohol.  Avoid sitting or lying for a long time without moving your legs.  If you have surgery or you are hospitalized, ask about ways to prevent blood clots. These may include taking frequent walks or using anticoagulants.  Stay at a healthy weight.  If you are a woman who is older than age 73, avoid unnecessary use of medicines that contain estrogen, such as some birth control pills.  Do not use any products that contain nicotine or tobacco, such as cigarettes and e-cigarettes. This is especially important if you take estrogen medicines. If you need help quitting, ask your health care provider. Contact a health care provider if:  You miss a dose of your blood thinner.  Your menstrual period is heavier than usual.  You have unusual bruising. Get help right away if:  You have: ? New or increased pain, swelling, or redness in an arm or leg. ? Numbness or tingling in an arm or leg. ? Shortness of breath. ? Chest pain. ? A rapid or irregular heartbeat. ? A severe headache or confusion. ? A cut that will not stop  bleeding.  There is blood in your vomit, stool, or urine.  You have a serious fall or accident, or you hit your head.  You feel light-headed or dizzy.  You cough up blood. These symptoms may represent a serious problem that is an emergency. Do not wait to see if the symptoms will go away. Get medical help right away. Call your local emergency services (911 in the [place] Do not drive yourself to the hospital. Summary  Deep vein thrombosis (DVT) is a condition in which a blood clot forms in a deep vein, such as a lower leg, thigh, or arm vein.  Symptoms can include swelling, warmth, pain, and redness in your leg or arm.  This condition may be treated with a blood thinner (anticoagulant medicine), medicine that is injected to dissolve blood clots,compression stockings, or surgery.  If you are prescribed blood thinners, take them exactly as told. +++++++++++++++++++++++++++++++++++  Bleeding Precautions When on Anticoagulant Therapy  Anticoagulant therapy, also called blood thinner therapy, is medicine that helps to prevent and treat blood clots. The medicine works by stopping blood clots from forming or growing. Blood clots that form in your blood vessels can be dangerous. They can break loose and travel to the heart, lungs, or brain. This increases the risk of a heart attack, stroke, or blocked lung artery (pulmonary embolism). Anticoagulants also increase the risk of bleeding. Try to protect yourself from cuts and other injuries that can cause bleeding. It is important to take anticoagulants exactly as told by your health care provider. Why do I need to be on anticoagulant therapy? You may need this medicine if you are at risk of developing a blood clot. Conditions that increase your risk of a blood clot include:  Being born with heart disease or a heart malformation (congenital heart disease).  Developing heart disease.  Having had surgery, such as valve replacement.  Having had a  serious accident or other type of severe injury (trauma).  Having certain types of cancer.  Having certain diseases that can increase blood clotting.  Having a high risk of stroke or heart attack.  Having atrial fibrillation (  AF). What are the common anticoagulant medicines? There are several types of anticoagulant medicines. The most common types are:  Medicines that you take by mouth (oral medicines), such as: ? Warfarin. ? Novel oral anticoagulants (NOACs), such as: ? Direct thrombin inhibitors (dabigatran). ? Factor Xa inhibitors (apixaban, edoxaban, and rivaroxaban).  Injections, such as: ? Unfractionated heparin. ? Low molecular weight heparin. These anticoagulants work in different ways to prevent blood clots. They also have different risks and side effects. What do I need to remember while on anticoagulant therapy? Taking anticoagulants  Take your medicine at the same time every day. If you forget to take your medicine, take it as soon as you remember. Do not double your dosage of medicine if you miss a whole day. Take your normal dose and call your health care provider.  Do not stop taking your medicine unless your health care provider approves. Stopping the medicine can increase your risk of developing a blood clot. Taking other medicines  Take over-the-counter and prescriptions medicines only as told by your health care provider.  Do not take over-the-counter NSAIDs, including aspirin and ibuprofen, while you are on anticoagulant therapy. These medicines increase your risk of dangerous bleeding.  Get approval from your health care provider before you start taking any new medicines, vitamins, or herbal products. Some of these could interfere with your therapy. General instructions  Keep all follow-up visits as told by your health care provider. This is important.  If you are pregnant or trying to get pregnant, talk with a health care provider about anticoagulants.  Some of these medicines are not safe to take during pregnancy.  Tell all health care providers, including your dentist, that you are on anticoagulant therapy. It is especially important to tell providers before you have any surgery, medical procedures, or dental work done. What precautions should I take?   Be very careful when using knives, scissors, or other sharp objects.  Use an electric razor instead of a blade.  Do not use toothpicks.  Use a soft-bristled toothbrush. Brush your teeth gently.  Always wear shoes outdoors and wear slippers indoors.  Be careful when cutting your fingernails and toenails.  Place bath mats in the bathroom. If possible, install handrails as well.  Wear gloves while you do yard work.  Wear your seat belt.  Prevent falls by removing loose rugs and extension cords from areas where you walk. Use a cane or walker if you need it.  Avoid constipation by: ? Drinking enough fluid to keep your urine clear or pale yellow. ? Eating foods that are high in fiber, such as fresh fruits and vegetables, whole grains, and beans. ? Limiting foods that are high in fat and processed sugars, such as fried and sweet foods.  Do not play contact sports or participate in other activities that have a high risk for injury. What other precautions are important if on warfarin therapy? If you are taking a type of anticoagulant called warfarin, make sure you:  Work with a diet and nutrition specialist (dietitian) to make an eating plan. Do not make any sudden changes to your diet after you have started your eating plan.  Do not drink alcohol. It can interfere with your medicine and increase your risk of an injury that causes bleeding.  Get regular blood tests as told by your health care provider. What are some questions to ask my health care provider?  Why do I need anticoagulant therapy?  What is the best anticoagulant  therapy for my condition?  How long will I need  anticoagulant therapy?  What are the side effects of anticoagulant therapy?  When should I take my medicine? What should I do if I forget to take it?  Will I need to have regular blood tests?  Do I need to change my diet? Are there foods or drinks that I should avoid?  What activities are safe for me?  What should I do if I want to get pregnant? Contact a health care provider if:  You miss a dose of medicine: ? And you are not sure what to do. ? For more than one day.  You have: ? Menstrual bleeding that is heavier than normal. ? Bloody or brown urine. ? Easy bruising. ? Black and tarry stool or bright red stool. ? Side effects from your medicine.  You feel weak or dizzy.  You become pregnant. Get help right away if:  You have bleeding that will not stop within 20 minutes from: ? The nose. ? The gums. ? A cut on the skin.  You have a severe headache or stomachache.  You vomit or cough up blood.  You fall or hit your head. Summary  Anticoagulant therapy, also called blood thinner therapy, is medicine that helps to prevent and treat blood clots.  Anticoagulants work in different ways to prevent blood clots. They also have different risks and side effects.  Talk with your health care provider about any precautions that you should take while on anticoagulant therapy.

## 2019-07-19 ENCOUNTER — Ambulatory Visit: Payer: BC Managed Care – PPO | Admitting: Internal Medicine

## 2019-07-19 ENCOUNTER — Other Ambulatory Visit: Payer: Self-pay

## 2019-07-19 VITALS — BP 116/84 | HR 56 | Temp 97.0°F | Resp 16 | Ht 70.0 in | Wt 248.0 lb

## 2019-07-19 DIAGNOSIS — E66812 Obesity, class 2: Secondary | ICD-10-CM

## 2019-07-19 DIAGNOSIS — Z79899 Other long term (current) drug therapy: Secondary | ICD-10-CM | POA: Diagnosis not present

## 2019-07-19 DIAGNOSIS — I824Y1 Acute embolism and thrombosis of unspecified deep veins of right proximal lower extremity: Secondary | ICD-10-CM

## 2019-07-19 DIAGNOSIS — Z131 Encounter for screening for diabetes mellitus: Secondary | ICD-10-CM

## 2019-07-19 DIAGNOSIS — E6609 Other obesity due to excess calories: Secondary | ICD-10-CM

## 2019-07-19 DIAGNOSIS — N138 Other obstructive and reflux uropathy: Secondary | ICD-10-CM

## 2019-07-19 DIAGNOSIS — I10 Essential (primary) hypertension: Secondary | ICD-10-CM | POA: Diagnosis not present

## 2019-07-19 DIAGNOSIS — R7309 Other abnormal glucose: Secondary | ICD-10-CM

## 2019-07-19 DIAGNOSIS — E782 Mixed hyperlipidemia: Secondary | ICD-10-CM

## 2019-07-19 DIAGNOSIS — Z13220 Encounter for screening for lipoid disorders: Secondary | ICD-10-CM | POA: Diagnosis not present

## 2019-07-19 DIAGNOSIS — Z8249 Family history of ischemic heart disease and other diseases of the circulatory system: Secondary | ICD-10-CM

## 2019-07-19 DIAGNOSIS — E559 Vitamin D deficiency, unspecified: Secondary | ICD-10-CM

## 2019-07-19 DIAGNOSIS — Z136 Encounter for screening for cardiovascular disorders: Secondary | ICD-10-CM

## 2019-07-19 DIAGNOSIS — Z125 Encounter for screening for malignant neoplasm of prostate: Secondary | ICD-10-CM

## 2019-07-19 DIAGNOSIS — N401 Enlarged prostate with lower urinary tract symptoms: Secondary | ICD-10-CM | POA: Diagnosis not present

## 2019-07-19 DIAGNOSIS — Z87891 Personal history of nicotine dependence: Secondary | ICD-10-CM | POA: Diagnosis not present

## 2019-07-19 DIAGNOSIS — Z1329 Encounter for screening for other suspected endocrine disorder: Secondary | ICD-10-CM | POA: Diagnosis not present

## 2019-07-19 DIAGNOSIS — Z1389 Encounter for screening for other disorder: Secondary | ICD-10-CM | POA: Diagnosis not present

## 2019-07-19 DIAGNOSIS — Z6835 Body mass index (BMI) 35.0-35.9, adult: Secondary | ICD-10-CM

## 2019-07-19 DIAGNOSIS — R350 Frequency of micturition: Secondary | ICD-10-CM | POA: Diagnosis not present

## 2019-07-19 DIAGNOSIS — Z130 Encounter for screening for diseases of the blood and blood-forming organs and certain disorders involving the immune mechanism: Secondary | ICD-10-CM

## 2019-07-19 DIAGNOSIS — Z0001 Encounter for general adult medical examination with abnormal findings: Secondary | ICD-10-CM

## 2019-07-19 DIAGNOSIS — Z0000 Encounter for general adult medical examination without abnormal findings: Secondary | ICD-10-CM | POA: Diagnosis not present

## 2019-07-19 DIAGNOSIS — R7303 Prediabetes: Secondary | ICD-10-CM

## 2019-07-19 DIAGNOSIS — Z1211 Encounter for screening for malignant neoplasm of colon: Secondary | ICD-10-CM

## 2019-07-19 DIAGNOSIS — R5383 Other fatigue: Secondary | ICD-10-CM

## 2019-07-20 ENCOUNTER — Encounter (INDEPENDENT_AMBULATORY_CARE_PROVIDER_SITE_OTHER): Payer: Self-pay

## 2019-07-20 LAB — COMPLETE METABOLIC PANEL WITH GFR
AG Ratio: 1.5 (calc) (ref 1.0–2.5)
ALT: 17 U/L (ref 9–46)
AST: 16 U/L (ref 10–35)
Albumin: 3.9 g/dL (ref 3.6–5.1)
Alkaline phosphatase (APISO): 57 U/L (ref 35–144)
BUN/Creatinine Ratio: 16 (calc) (ref 6–22)
BUN: 21 mg/dL (ref 7–25)
CO2: 27 mmol/L (ref 20–32)
Calcium: 9.8 mg/dL (ref 8.6–10.3)
Chloride: 105 mmol/L (ref 98–110)
Creat: 1.34 mg/dL — ABNORMAL HIGH (ref 0.70–1.25)
GFR, Est African American: 63 mL/min/{1.73_m2} (ref >=60)
GFR, Est Non African American: 54 mL/min/{1.73_m2} — ABNORMAL LOW (ref >=60)
Globulin: 2.6 g/dL (calc) (ref 1.9–3.7)
Glucose, Bld: 127 mg/dL — ABNORMAL HIGH (ref 65–99)
Potassium: 4.1 mmol/L (ref 3.5–5.3)
Sodium: 140 mmol/L (ref 135–146)
Total Bilirubin: 0.5 mg/dL (ref 0.2–1.2)
Total Protein: 6.5 g/dL (ref 6.1–8.1)

## 2019-07-20 LAB — URINALYSIS, ROUTINE W REFLEX MICROSCOPIC
Bilirubin Urine: NEGATIVE
Glucose, UA: NEGATIVE
Hgb urine dipstick: NEGATIVE
Ketones, ur: NEGATIVE
Leukocytes,Ua: NEGATIVE
Nitrite: NEGATIVE
Protein, ur: NEGATIVE
Specific Gravity, Urine: 1.014 (ref 1.001–1.03)
pH: 5.5 (ref 5.0–8.0)

## 2019-07-20 LAB — CBC WITH DIFFERENTIAL/PLATELET
Absolute Monocytes: 570 cells/uL (ref 200–950)
Basophils Absolute: 47 cells/uL (ref 0–200)
Basophils Relative: 0.7 %
Eosinophils Absolute: 221 cells/uL (ref 15–500)
Eosinophils Relative: 3.3 %
HCT: 44.3 % (ref 38.5–50.0)
Hemoglobin: 15 g/dL (ref 13.2–17.1)
Lymphs Abs: 1843 cells/uL (ref 850–3900)
MCH: 30.9 pg (ref 27.0–33.0)
MCHC: 33.9 g/dL (ref 32.0–36.0)
MCV: 91.3 fL (ref 80.0–100.0)
MPV: 9.9 fL (ref 7.5–12.5)
Monocytes Relative: 8.5 %
Neutro Abs: 4020 cells/uL (ref 1500–7800)
Neutrophils Relative %: 60 %
Platelets: 227 10*3/uL (ref 140–400)
RBC: 4.85 10*6/uL (ref 4.20–5.80)
RDW: 12.5 % (ref 11.0–15.0)
Total Lymphocyte: 27.5 %
WBC: 6.7 10*3/uL (ref 3.8–10.8)

## 2019-07-20 LAB — MICROALBUMIN / CREATININE URINE RATIO
Creatinine, Urine: 78 mg/dL (ref 20–320)
Microalb, Ur: <0.2 mg/dL

## 2019-07-20 LAB — IRON, TOTAL/TOTAL IRON BINDING CAP
%SAT: 31 % (calc) (ref 20–48)
Iron: 97 ug/dL (ref 50–180)
TIBC: 310 mcg/dL (calc) (ref 250–425)

## 2019-07-20 LAB — TESTOSTERONE: Testosterone: 420 ng/dL (ref 250–827)

## 2019-07-20 LAB — LIPID PANEL
Cholesterol: 143 mg/dL (ref <200)
HDL: 44 mg/dL (ref >=40)
LDL Cholesterol (Calc): 84 mg/dL (calc)
Non-HDL Cholesterol (Calc): 99 mg/dL (calc) (ref <130)
Total CHOL/HDL Ratio: 3.3 (calc) (ref <5.0)
Triglycerides: 69 mg/dL (ref <150)

## 2019-07-20 LAB — D-DIMER, QUANTITATIVE: D-Dimer, Quant: 3.01 mcg/mL FEU — ABNORMAL HIGH (ref <0.50)

## 2019-07-20 LAB — PSA: PSA: 0.7 ng/mL (ref <=4.0)

## 2019-07-20 LAB — HEMOGLOBIN A1C
Hgb A1c MFr Bld: 6 % of total Hgb — ABNORMAL HIGH (ref <5.7)
Mean Plasma Glucose: 126 (calc)
eAG (mmol/L): 7 (calc)

## 2019-07-20 LAB — TSH: TSH: 1.6 mIU/L (ref 0.40–4.50)

## 2019-07-20 LAB — INSULIN, RANDOM: Insulin: 24.5 u[IU]/mL — ABNORMAL HIGH

## 2019-07-20 LAB — VITAMIN D 25 HYDROXY (VIT D DEFICIENCY, FRACTURES): Vit D, 25-Hydroxy: 91 ng/mL (ref 30–100)

## 2019-07-20 LAB — VITAMIN B12: Vitamin B-12: 538 pg/mL (ref 200–1100)

## 2019-07-20 LAB — MAGNESIUM: Magnesium: 2 mg/dL (ref 1.5–2.5)

## 2019-07-22 ENCOUNTER — Encounter: Payer: Self-pay | Admitting: Internal Medicine

## 2019-08-09 NOTE — Progress Notes (Signed)
Assessment and Plan:  Alex Lewis was seen today for edema.  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 Patient is very anxious and concerned due to edema not improving as it did last time After discussion will proceed with repeat study to r/o worsening or proximal clot requiring referral to vascular for surgical management Continue xarelto 20 mg daily in the interim; if DVT confirmed, will plan on extended course due to recurrence with 3 month treatment  Go to the ER if any chest pain, shortness of breath, nausea, dizziness, severe HA, changes vision/speech -     US Venous Img Lower Unilateral Right (DVT); Future  Essential hypertension Continue medications, restart lasix 40 mg daily PRN if edema and above goal  Monitor blood pressure at home; call if consistently over 130/80 Continue DASH diet.   Reminder to go to the ER if any CP, SOB, nausea, dizziness, severe HA, changes vision/speech, left arm numbness and tingling and jaw pain.  Morbid obesity ([place]) Long discussion about weight loss, diet, and exercise Recommended diet heavy in fruits and veggies and low in animal meats, cheeses, and dairy products, appropriate calorie intake Discussed appropriate weight for height and initial goal (<230lb) Follow up at next visit  Further disposition pending results of labs. Discussed med's effects and SE's.   Over 30 minutes of exam, counseling, chart review, and critical decision making was performed.   Future Appointments  Date Time Provider [place]  08/21/2019 10:00 AM Garnet Sierras, NP [facility] None  10/24/2019 10:45 AM Liane Comber, NP [facility] None  01/29/2020 10:30 AM Unk Pinto, MD [facility] None  08/01/2020 10:00 AM Unk Pinto, MD [facility] None     ------------------------------------------------------------------------------------------------------------------   HPI BP 130/90   Pulse 64   Temp 97.6 F (36.4 C)   Resp 16   Wt 254 lb 9.6 oz (115.5 kg)   BMI 36.53 kg/m   68 y.o.male with hx of htn, morbid obesity presents for evaluation due to persistent RLE edema.   Patient was treated for a RLE DVT in June 2020 on Xarelto x 3 months thru Sept and re-presented 10/15 with recurrent erythema , RLE swelling & (+) Rt Homans sign & was restarted on Xarelto, 15 mg BID, today starting 20 mg daily. He reports edema has been persistent since that time, mildly improved with the AM but increases in the evening. He has been elevating extremity, wearing compression hose and trying to get up and walk at sedentary job though admits not consistent with this. Denies pain, paraesthesias of extremity. Denies HA, vision changes, dizziness, weakness. Denies CP, cough, dyspnea.   Vascular US in 02/2019 showed:  Right: Findings consistent with acute non-occlusive deep vein thrombosis involving the right femoral vein, right popliteal vein, and right posterior tibial veins. No cystic structure found in the popliteal fossa. All other veins visualized appear fully  compressible and demonstrate appropriate Doppler characteristics.   Left: No evidence of deep vein thrombosis in the lower extremity. No indirect evidence of obstruction proximal to the inguinal ligament. No cystic structure found in the popliteal fossa.  BMI is Body mass index is 36.53 kg/m., he has been working on diet and exercise, was trying to walk daily but admits has fallen off the wagon in the last 3 weeks and gained some weight.  Wt Readings from Last 3 Encounters:  08/10/19  254 lb 9.6 oz (115.5 kg)  07/19/19 248 lb (112.5 kg)  07/12/19 249 lb (112.9 kg)     Past Medical History:  Diagnosis Date  . Allergic rhinitis   . BPH (benign prostatic hyperplasia)   . GERD  (gastroesophageal reflux disease)   . Hyperlipidemia   . Hypertension   . Hypogonadism, male   . Kidney stones   . Morbid obesity ([facility])   . Pre-diabetes   . Vitamin D deficiency      Allergies  Allergen Reactions  . Penicillins     REACTION: unknown    Current Outpatient Medications on File Prior to Visit  Medication Sig  . B Complex-C (SUPER B COMPLEX PO) Take 1 tablet by mouth daily.  . bisoprolol-hydrochlorothiazide (ZIAC) 5-6.25 MG tablet Take 1 tablet Daily for BP  . Calcium Carbonate Antacid (TUMS PO) Take by mouth.  . Cholecalciferol (VITAMIN D-3) 5000 UNITS TABS Take 10,000 Units by mouth daily.  . famotidine (PEPCID) 20 MG tablet Take 1 tablet 2 x /day with meals for Acid Indigestion & Reflux  . fluticasone (FLONASE) 50 MCG/ACT nasal spray SPRAY 2 SPRAYS INTO EACH NOSTRIL EVERY DAY  . loratadine (CLARITIN) 10 MG tablet Take 10 mg by mouth daily.  . montelukast (SINGULAIR) 10 MG tablet Take 1 tablet daily for Allergies  . rivaroxaban (XARELTO) 20 MG TABS tablet Initiate after completion of initial starter pack, take for total of 3 months. Do not take with aspirin.  . furosemide (LASIX) 40 MG tablet Take 1 tab daily as needed for edema.   No current facility-administered medications on file prior to visit.     ROS: all negative except above.   Physical Exam:  BP 130/90   Pulse 64   Temp 97.6 F (36.4 C)   Resp 16   Wt 254 lb 9.6 oz (115.5 kg)   BMI 36.53 kg/m   General Appearance: Well nourished, well dressed, morbidly obese male in no apparent distress. Eyes: PERRLA, EOMs, conjunctiva no swelling or erythema ENT/Mouth: No erythema, swelling, or exudate on post pharynx.  Tonsils not swollen or erythematous. Hearing normal.  Neck: Supple Respiratory: Respiratory effort normal, BS equal bilaterally without rales, rhonchi, wheezing or stridor.  Cardio: RRR with no MRGs. Brisk peripheral pulses, feet are warm, toes cool bilaterally, R lower leg with pitting edema  2+ through foot, ankle, calf, neg homan's, no tenderness; R calf 44 cm, L 41 cm.  Abdomen: Soft, obese abdomen, + BS.  Non tender, no palpable masses.  Lymphatics: Non tender without lymphadenopathy.  Musculoskeletal: Full ROM, no bony abnormality or obvious deformity, 5/5 strength, normal gait.  Skin: Warm, dry without rashes, lesions, ecchymosis.  Neuro: Cranial nerves intact. Normal muscle tone, no cerebellar symptoms. Sensation intact.  Psych: Awake and oriented X 3, normal affect, Insight and Judgment appropriate.     Dan Maker, NP 11:16 AM [facility]

## 2019-08-10 ENCOUNTER — Encounter: Payer: Self-pay | Admitting: Adult Health

## 2019-08-10 ENCOUNTER — Other Ambulatory Visit: Payer: Self-pay

## 2019-08-10 ENCOUNTER — Ambulatory Visit (INDEPENDENT_AMBULATORY_CARE_PROVIDER_SITE_OTHER): Payer: BC Managed Care – PPO | Admitting: Adult Health

## 2019-08-10 VITALS — BP 130/90 | HR 64 | Temp 97.6°F | Resp 16 | Wt 254.6 lb

## 2019-08-10 DIAGNOSIS — Z86718 Personal history of other venous thrombosis and embolism: Secondary | ICD-10-CM | POA: Diagnosis not present

## 2019-08-10 DIAGNOSIS — I10 Essential (primary) hypertension: Secondary | ICD-10-CM | POA: Diagnosis not present

## 2019-08-10 DIAGNOSIS — R600 Localized edema: Secondary | ICD-10-CM

## 2019-08-10 NOTE — Patient Instructions (Addendum)
Deep Vein Thrombosis  Deep vein thrombosis (DVT) is a condition in which a blood clot forms in a deep vein, such as a lower leg, thigh, or arm vein. A clot is blood that has thickened into a gel or solid. This condition is dangerous. It can lead to serious and even life-threatening complications if the clot travels to the lungs and causes a blockage (pulmonary embolism). It can also damage veins in the leg. This can result in leg pain, swelling, discoloration, and sores (post-thrombotic syndrome). What are the causes? This condition may be caused by:  A slowdown of blood flow.  Damage to a vein.  A condition that causes blood to clot more easily, such as an inherited clotting disorder. What increases the risk? The following factors may make you more likely to develop this condition:  Being overweight.  Being older, especially over age 60.  Sitting or lying down for more than four hours.  Being in the hospital.  Lack of physical activity (sedentary lifestyle).  Pregnancy, being in childbirth, or having recently given birth.  Taking medicines that contain estrogen, such as medicines to prevent pregnancy.  Smoking.  A history of any of the following: ? Blood clots or a blood clotting disease. ? Peripheral vascular disease. ? Inflammatory bowel disease. ? Cancer. ? Heart disease. ? Genetic conditions that affect how your blood clots, such as Factor V Leiden mutation. ? Neurological diseases that affect your legs (leg paresis). ? A recent injury, such as a car accident. ? Major or lengthy surgery. ? A central line placed inside a large vein. What are the signs or symptoms? Symptoms of this condition include:  Swelling, pain, or tenderness in an arm or leg.  Warmth, redness, or discoloration in an arm or leg. If the clot is in your leg, symptoms may be more noticeable or worse when you stand or walk. Some people may not develop any symptoms. How is this diagnosed? This  condition is diagnosed with:  A medical history and physical exam.  Tests, such as: ? Blood tests. These are done to check how well your blood clots. ? Ultrasound. This is done to check for clots. ? Venogram. For this test, contrast dye is injected into a vein and X-rays are taken to check for any clots. How is this treated? Treatment for this condition depends on:  The cause of your DVT.  Your risk for bleeding or developing more clots.  Any other medical conditions that you have. Treatment may include:  Taking a blood thinner (anticoagulant). This type of medicine prevents clots from forming. It may be taken by mouth, injected under the skin, or injected through an IV (catheter).  Injecting clot-dissolving medicines into the affected vein (catheter-directed thrombolysis).  Having surgery. Surgery may be done to: ? Remove the clot. ? Place a filter in a large vein to catch blood clots before they reach the lungs. Some treatments may be continued for up to six months. Follow these instructions at home: If you are taking blood thinners:  Take the medicine exactly as told by your health care provider. Some blood thinners need to be taken at the same time every day. Do not skip a dose.  Talk with your health care provider before you take any medicines that contain aspirin or NSAIDs. These medicines increase your risk for dangerous bleeding.  Ask your health care provider about foods and drugs that could change the way the medicine works (may interact). Avoid those things if your   health care provider tells you to do so.  Blood thinners can cause easy bruising and may make it difficult to stop bleeding. Because of this: ? Be very careful when using knives, scissors, or other sharp objects. ? Use an electric razor instead of a blade. ? Avoid activities that could cause injury or bruising, and follow instructions about how to prevent falls.  Wear a medical alert bracelet or carry a  card that lists what medicines you take. General instructions  Take over-the-counter and prescription medicines only as told by your health care provider.  Return to your normal activities as told by your health care provider. Ask your health care provider what activities are safe for you.  Wear compression stockings if recommended by your health care provider.  Keep all follow-up visits as told by your health care provider. This is important. How is this prevented? To lower your risk of developing this condition again:  For 30 or more minutes every day, do an activity that: ? Involves moving your arms and legs. ? Increases your heart rate.  When traveling for longer than four hours: ? Exercise your arms and legs every hour. ? Drink plenty of water. ? Avoid drinking alcohol.  Avoid sitting or lying for a long time without moving your legs.  If you have surgery or you are hospitalized, ask about ways to prevent blood clots. These may include taking frequent walks or using anticoagulants.  Stay at a healthy weight.  If you are a woman who is older than age 68, avoid unnecessary use of medicines that contain estrogen, such as some birth control pills.  Do not use any products that contain nicotine or tobacco, such as cigarettes and e-cigarettes. This is especially important if you take estrogen medicines. If you need help quitting, ask your health care provider. Contact a health care provider if:  You miss a dose of your blood thinner.  Your menstrual period is heavier than usual.  You have unusual bruising. Get help right away if:  You have: ? New or increased pain, swelling, or redness in an arm or leg. ? Numbness or tingling in an arm or leg. ? Shortness of breath. ? Chest pain. ? A rapid or irregular heartbeat. ? A severe headache or confusion. ? A cut that will not stop bleeding.  There is blood in your vomit, stool, or urine.  You have a serious fall or accident,  or you hit your head.  You feel light-headed or dizzy.  You cough up blood. These symptoms may represent a serious problem that is an emergency. Do not wait to see if the symptoms will go away. Get medical help right away. Call your local emergency services (911 in the [place] Do not drive yourself to the hospital. Summary  Deep vein thrombosis (DVT) is a condition in which a blood clot forms in a deep vein, such as a lower leg, thigh, or arm vein.  Symptoms can include swelling, warmth, pain, and redness in your leg or arm.  This condition may be treated with a blood thinner (anticoagulant medicine), medicine that is injected to dissolve blood clots,compression stockings, or surgery.  If you are prescribed blood thinners, take them exactly as told. This information is not intended to replace advice given to you by your health care provider. Make sure you discuss any questions you have with your health care provider. Document Released: 09/13/2005 Document Revised: 08/26/2017 Document Reviewed: 02/11/2017 Elsevier Patient Education  2020 [employer]  Venous Thromboembolism Prevention Venous thromboembolism (VTE) is a condition in which a blood clot (thrombus) develops in the body. A deep vein thrombosis (DVT) is a blood clot that usually occurs in a deep vein in the leg or the pelvis, but it can also occur in the arm. Sometimes, pieces of a thrombus can break off and travel through the bloodstream to other parts of the body. When that happens, the thrombus is called an embolus. An embolus that travels to the lungs is called a pulmonary embolism. An embolism can block the blood flow in the blood vessels of other organs as well. How can this condition affect me? VTE is a serious health condition that can cause disability or death. It is very important to get help right away. Do not ignore your symptoms. What can increase my risk? You are more likely to develop this condition if you:   Have had recent major surgery or trauma.  Have certain health conditions, such as cancer.  Are in the hospital for a sudden illness.  Have not moved for a long period of time, such as if you are on bed rest or traveling a long distance.  Take certain medicines, such as birth control pills or hormone replacement therapy.  Are pregnant or recently gave birth.  Have a personal or family history of VTE.  Are overweight.  Are 4 years of age or older.  Use products that contain nicotine and tobacco. What actions can I take to prevent this? In the hospital A VTE may be prevented by taking medicines that are prescribed to prevent blood clots (anticoagulants). You can also help to prevent VTE while in the hospital by taking these actions:  Get out of bed and walk. Ask your health care provider if this is safe for you to do.  Ask your health care provider if you should use a sequential compression device (SCD). This is a machine that pumps air into compression sleeves that are wrapped around your legs.  Ask your health care provider if you should wear tight, elastic stockings that apply pressure to the lower legs (compression stockings). Compression stockings are sometimes used with SCDs. At home after surgery Understand that you have an increased risk for VTE for the first 4-6 weeks after surgery. During this time:  Avoid traveling for more than 4 hours. If you must travel after surgery, ask your health care provider about additional preventive actions that you can take.  Avoid sitting or lying still for too long. If possible, get up and walk around one time every hour. Ask your health care provider when this is safe for you to do. While traveling Travel that takes more than 4 hours can increase the risk of a VTE. To prevent VTE when traveling:  Exercise your arms and legs every hour. You can do this by standing, stretching, and bending and straightening your arms and legs. If you are  traveling by airplane, train, or bus, walk up and down the aisle as often as possible to get your blood moving. If you are traveling by car, stop and get out of the car every hour to exercise your arms and legs and stretch. Other types of exercise might include: ? Keeping your feet flat on the ground and raising your toes. ? Switching from tightening the muscles in your calves and thighs to relaxing those same muscles while you are sitting. ? Pointing and flexing your feet at the ankle joints while you are sitting.  Drink  enough water to keep your urine pale yellow.  Avoid drinking alcohol during long travel. Generally, it is not recommended that you take medicines to prevent DVT during routine travel. Activity   Stay active. Avoid sitting or lying in bed for long periods of time without moving your arms and legs.  Exercise by moving your arms and legs for 30 minutes or more every day.  Try not to bump or injure your legs.  Avoid crossing your legs when you are sitting. Lifestyle  Do not use any products that contain nicotine or tobacco, such as cigarettes, e-cigarettes, and chewing tobacco. If you need help quitting, ask your health care provider. This is especially important if you take estrogen medicines.  Avoid wearing tight clothing around your legs or waist. General information   Wear compression stockings as told by your health care provider. These stockings help to prevent blood clots and reduce swelling in your legs. Do not let them bunch up when you are wearing them.  Avoid using medicines that contain estrogen if you do not need them. These include birth control pills and hormone replacement therapy.  Do not use pillows under your knees while lying down unless told by your health care provider.  Maintain a healthy weight. Where to find more information  Centers for Disease Control and Prevention: [employer]  American Heart Association: www.heart.org Get help right  away if:  You have chest pain or shortness of breath.  You cough up blood.  You have a rapid or irregular heartbeat.  You feel light-headed or dizzy.  You have new or increased pain, swelling, or redness in an arm or leg.  You have numbness or tingling in an arm or leg.  You have blood in your vomit, stool, or urine. These symptoms may represent a serious problem that is an emergency. Do not wait to see if the symptoms will go away. Get medical help right away. Call your local emergency services (911 in the [place] Do not drive yourself to the hospital. Summary  Venous thromboembolism (VTE) is a condition in which a blood clot (thrombus)develops in the body. A deep vein thrombosis (DVT) is a blood clot that usually occurs in a deep vein in the leg or the pelvis, but it can also occur in the arm.  VTE is a serious health condition that can cause disability or death. It is very important to get help right away. Do not ignore your symptoms.  If you are traveling, exercise your arms and legs every hour.  Stay active to help prevent blood clots. Avoid sitting or lying in bed for long periods of time without moving your arms and legs. This information is not intended to replace advice given to you by your health care provider. Make sure you discuss any questions you have with your health care provider. Document Released: 09/01/2009 Document Revised: 01/02/2019 Document Reviewed: 11/21/2018 Elsevier Patient Education  2020 [employer] Inc[employer]    Rivaroxaban oral tablets (xarelto) What is this medicine? RIVAROXABAN (ri va ROX a ban) is an anticoagulant (blood thinner). It is used to treat blood clots in the lungs or in the veins. It is also used to prevent blood clots in the lungs or in the veins. It is also used to lower the chance of stroke in people with a medical condition called atrial fibrillation. This medicine may be used for other purposes; ask your health care provider or pharmacist if  you have questions. COMMON BRAND NAME(S): Xarelto, Xarelto Starter Pack  What should I tell my health care provider before I take this medicine? They need to know if you have any of these conditions:  antiphospholipid antibody syndrome  artificial heart valve  bleeding disorders  bleeding in the brain  blood in your stools (black or tarry stools) or if you have blood in your vomit  history of blood clots  history of stomach bleeding  kidney disease  liver disease  low blood counts, like low white cell, platelet, or red cell counts  recent or planned spinal or epidural procedure  take medicines that treat or prevent blood clots  an unusual or allergic reaction to rivaroxaban, other medicines, foods, dyes, or preservatives  pregnant or trying to get pregnant  breast-feeding How should I use this medicine? Take this medicine by mouth with a glass of water. Follow the directions on the prescription label. Take your medicine at regular intervals. Do not take it more often than directed. Do not stop taking except on your doctor's advice. Stopping this medicine may increase your risk of a blood clot. Be sure to refill your prescription before you run out of medicine. If you are taking this medicine after hip or knee replacement surgery, take it with or without food. If you are taking this medicine for atrial fibrillation, take it with your evening meal. If you are taking this medicine to treat blood clots, take it with food at the same time each day. If you are unable to swallow your tablet, you may crush the tablet and mix it in applesauce. Then, immediately eat the applesauce. You should eat more food right after you eat the applesauce containing the crushed tablet. Talk to your pediatrician regarding the use of this medicine in children. Special care may be needed. Overdosage: If you think you have taken too much of this medicine contact a poison control center or emergency room at  once. NOTE: This medicine is only for you. Do not share this medicine with others. What if I miss a dose? If you take your medicine once a day and miss a dose, take the missed dose as soon as you remember. If it is almost time for your next dose, take only that dose. Do not take double or extra doses. If you take your medicine twice a day and miss a dose, take the missed dose immediately. In this instance, 2 tablets may be taken at the same time. The next day you should take 1 tablet twice a day as directed. What may interact with this medicine? Do not take this medicine with any of the following medications:  defibrotide This medicine may also interact with the following medications:  aspirin and aspirin-like medicines  certain antibiotics like erythromycin, azithromycin, and clarithromycin  certain medicines for fungal infections like ketoconazole and itraconazole  certain medicines for irregular heart beat like amiodarone, quinidine, dronedarone  certain medicines for seizures like carbamazepine, phenytoin  certain medicines that treat or prevent blood clots like warfarin, enoxaparin, and dalteparin  conivaptan  felodipine  indinavir  lopinavir; ritonavir  NSAIDS, medicines for pain and inflammation, like ibuprofen or naproxen  ranolazine  rifampin  ritonavir  SNRIs, medicines for depression, like desvenlafaxine, duloxetine, levomilnacipran, venlafaxine  SSRIs, medicines for depression, like citalopram, escitalopram, fluoxetine, fluvoxamine, paroxetine, sertraline  St. John's wort  verapamil This list may not describe all possible interactions. Give your health care provider a list of all the medicines, herbs, non-prescription drugs, or dietary supplements you use. Also tell them if you smoke,  drink alcohol, or use illegal drugs. Some items may interact with your medicine. What should I watch for while using this medicine? Visit your healthcare professional for  regular checks on your progress. You may need blood work done while you are taking this medicine. Your condition will be monitored carefully while you are receiving this medicine. It is important not to miss any appointments. Avoid sports and activities that might cause injury while you are using this medicine. Severe falls or injuries can cause unseen bleeding. Be careful when using sharp tools or knives. Consider using an Copy. Take special care brushing or flossing your teeth. Report any injuries, bruising, or red spots on the skin to your healthcare professional. If you are going to need surgery or other procedure, tell your healthcare professional that you are taking this medicine. Wear a medical ID bracelet or chain. Carry a card that describes your disease and details of your medicine and dosage times. What side effects may I notice from receiving this medicine? Side effects that you should report to your doctor or health care professional as soon as possible:  allergic reactions like skin rash, itching or hives, swelling of the face, lips, or tongue  back pain  redness, blistering, peeling or loosening of the skin, including inside the mouth  signs and symptoms of bleeding such as bloody or black, tarry stools; red or dark-brown urine; spitting up blood or brown material that looks like coffee grounds; red spots on the skin; unusual bruising or bleeding from the eye, gums, or nose  signs and symptoms of a blood clot such as chest pain; shortness of breath; pain, swelling, or warmth in the leg  signs and symptoms of a stroke such as changes in vision; confusion; trouble speaking or understanding; severe headaches; sudden numbness or weakness of the face, arm or leg; trouble walking; dizziness; loss of coordination Side effects that usually do not require medical attention (report to your doctor or health care professional if they continue or are bothersome):  dizziness  muscle  pain This list may not describe all possible side effects. Call your doctor for medical advice about side effects. You may report side effects to FDA at 1-800-FDA-1088. Where should I keep my medicine? Keep out of the reach of children. Store at room temperature between 15 and 30 degrees C (59 and 86 degrees F). Throw away any unused medicine after the expiration date. NOTE: This sheet is a summary. It may not cover all possible information. If you have questions about this medicine, talk to your doctor, pharmacist, or health care provider.  2020 Elsevier/Gold Standard (2018-12-11 09:45:59)

## 2019-08-21 ENCOUNTER — Ambulatory Visit: Payer: BC Managed Care – PPO | Admitting: Adult Health Nurse Practitioner

## 2019-08-21 ENCOUNTER — Telehealth: Payer: Self-pay | Admitting: Adult Health

## 2019-08-21 NOTE — Telephone Encounter (Signed)
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 He has continued with xarelto 20 mg daily per our instructions. He will keep follow up as scheduled and call sooner if any concerns in the interim.

## 2019-08-30 ENCOUNTER — Encounter: Payer: Self-pay | Admitting: Internal Medicine

## 2019-09-10 ENCOUNTER — Other Ambulatory Visit: Payer: Self-pay | Admitting: Adult Health

## 2019-09-10 DIAGNOSIS — I824Y1 Acute embolism and thrombosis of unspecified deep veins of right proximal lower extremity: Secondary | ICD-10-CM

## 2019-09-10 MED ORDER — RIVAROXABAN 20 MG PO TABS: ORAL_TABLET | ORAL | 0 refills | Status: DC

## 2019-09-13 ENCOUNTER — Encounter: Payer: Self-pay | Admitting: Internal Medicine

## 2019-10-23 NOTE — Progress Notes (Signed)
FOLLOW UP  Assessment and Plan:   Hypertension Well controlled with current medications  Monitor blood pressure at home; patient to call if consistently greater than 130/80 Continue DASH diet.   Reminder to go to the ER if any CP, SOB, nausea, dizziness, severe HA, changes vision/speech, left arm numbness and tingling and jaw pain.  Cholesterol Controlled by lifestyle modification Continue low cholesterol diet and exercise.  Check lipid panel.   Prediabetes Continue diet and exercise.  Perform daily foot/skin check, notify office of any concerning changes.  Check A1C  Obesity with co morbidities Commended excellent progress with 10 lb weight loss Long discussion about weight loss, diet, and exercise He admits to poor eating over the holidays, but motivated to improve Discussed ideal weight for height and initial weight goal (230lb)  Patient will work on portion control, eating dinner earlier, making small good decisions daily Will follow up in 3 months  Vitamin D Def/ osteoporosis prevention At goal at recent check; continue to recommend supplementation for goal of 70-100 Defer vitamin D level  GERD Well managed by lifestyle, doing fairly with famotidine, occasional tums for breakthrough  Discussed diet, avoiding triggers and other lifestyle changes - eat dinner earlier  Depression/anxiety Doing well off of medications Lifestyle discussed: diet/exerise, sleep hygiene, stress management, hydration  DVT of RLE Rebound DVT; will plan to continue xarelto to 20 mg daily for total of 6 months (complete current 90 day refill then stop), STOP ASA until then, restart once off of NOAC, discussed increased risk of GI bleed No concerns with excess bleeding at this time; no falls   Continue diet and meds as discussed. Further disposition pending results of labs. Discussed med's effects and SE's.   Over 30 minutes of exam, counseling, chart review, and critical decision making was  performed.   Future Appointments  Date Time Provider [place]  01/29/2020 10:30 AM Unk Pinto, MD [facility] None  08/01/2020 10:00 AM Unk Pinto, MD [facility] None    ----------------------------------------------------------------------------------------------------------------------  HPI 69 y.o. male  presents for 3 month follow up on hypertension, cholesterol, prediabetes, obesity, depression, GERD and vitamin D deficiency.   Patient was treated for a RLE DVT in June 2020 on Xarelto x 3 months thgough Sept and re-presented 10/15 with recurrent erythema , RLE swelling & (+) Rt Homans sign & was restarted on Xarelto, 20 mg daily, wearing compression hose daily.    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   BMI is Body mass index is 35.01 kg/m., he has been working on diet and exercise, elliptical daily, reports has lost 10 lb from peak weight at home since early January. Eating out/take out less and feels this is really helping. Initial goal <230 lb, goal for this year <200lb.  Wt Readings from Last 3 Encounters:  10/24/19 244 lb (110.7 kg)  08/10/19 254 lb 9.6 oz (115.5 kg)  07/19/19 248 lb (112.5 kg)   he has a diagnosis of GERD which is currently managed by famotidine 20 mg BID, takers a few tums occasionally for break through. he reports symptoms is currently well controlled.  His blood pressure has been controlled at home, today their BP is BP: 118/68  He does not workout. He denies chest pain, shortness of breath, dizziness.   He is not on cholesterol medication and denies myalgias. His cholesterol is at goal. The cholesterol last visit was:   Lab Results  Component Value Date   CHOL 143 07/19/2019  HDL 44 07/19/2019   LDLCALC 84 07/19/2019   TRIG 69 07/19/2019   CHOLHDL 3.3 07/19/2019    He has been working on diet for prediabetes, and denies increased appetite, nausea, paresthesia of the feet, polydipsia, polyuria, visual  disturbances and vomiting. Last A1C in the office was:  Lab Results  Component Value Date   HGBA1C 6.0 (H) 07/19/2019    He has CKD IIIa monitored at this office:  Lab Results  Component Value Date   GFRNONAA 54 (L) 07/19/2019   Patient is on Vitamin D supplement and at goal:   Lab Results  Component Value Date   VD25OH 91 07/19/2019       Current Medications:  Current Outpatient Medications on File Prior to Visit  Medication Sig  . ASPIRIN 81 PO Take by mouth daily.  . B Complex-C (SUPER B COMPLEX PO) Take 1 tablet by mouth daily.  . bisoprolol-hydrochlorothiazide (ZIAC) 5-6.25 MG tablet Take 1 tablet Daily for BP  . Calcium Carbonate Antacid (TUMS PO) Take by mouth.  . Cholecalciferol (VITAMIN D-3) 5000 UNITS TABS Take 10,000 Units by mouth daily.  . famotidine (PEPCID) 20 MG tablet Take 1 tablet 2 x /day with meals for Acid Indigestion & Reflux  . fluticasone (FLONASE) 50 MCG/ACT nasal spray SPRAY 2 SPRAYS INTO EACH NOSTRIL EVERY DAY  . furosemide (LASIX) 40 MG tablet Take 1 tab daily as needed for edema.  . Ginger, Zingiber officinalis, (GINGER ROOT) 550 MG CAPS Take by mouth daily.  Marland Kitchen loratadine (CLARITIN) 10 MG tablet Take 10 mg by mouth daily.  . montelukast (SINGULAIR) 10 MG tablet Take 1 tablet daily for Allergies  . rivaroxaban (XARELTO) 20 MG TABS tablet Take 1 tab by mouth daily for treatment of recurrent DVT. Do not take with aspirin.  . Zinc 50 MG TABS Take by mouth daily.   No current facility-administered medications on file prior to visit.     Allergies:  Allergies  Allergen Reactions  . Penicillins     REACTION: unknown     Medical History:  Past Medical History:  Diagnosis Date  . Allergic rhinitis   . BPH (benign prostatic hyperplasia)   . GERD (gastroesophageal reflux disease)   . Hyperlipidemia   . Hypertension   . Hypogonadism, male   . Kidney stones   . Morbid obesity (HCC)   . Pre-diabetes   . Vitamin D deficiency    Family history-  Reviewed and unchanged Social history- Reviewed and unchanged   Review of Systems:  Review of Systems  Constitutional: Negative for malaise/fatigue and weight loss.  HENT: Negative for hearing loss and tinnitus.   Eyes: Negative for blurred vision and double vision.  Respiratory: Negative for cough, shortness of breath and wheezing.   Cardiovascular: Negative for chest pain, palpitations, orthopnea, claudication and leg swelling.  Gastrointestinal: Negative for abdominal pain, blood in stool, constipation, diarrhea, heartburn, melena, nausea and vomiting.  Genitourinary: Negative.   Musculoskeletal: Negative for joint pain and myalgias.  Skin: Negative for rash.  Neurological: Negative for dizziness, tingling, sensory change, weakness and headaches.  Endo/Heme/Allergies: Negative for polydipsia.  Psychiatric/Behavioral: Negative.   All other systems reviewed and are negative.   Physical Exam: BP 118/68   Pulse 70   Temp (!) 97.5 F (36.4 C)   Wt 244 lb (110.7 kg)   SpO2 97%   BMI 35.01 kg/m  Wt Readings from Last 3 Encounters:  10/24/19 244 lb (110.7 kg)  08/10/19 254 lb 9.6 oz (115.5 kg)  07/19/19 248 lb (112.5 kg)   General Appearance: Well nourished, in no apparent distress. Eyes: PERRLA, EOMs, conjunctiva no swelling or erythema Sinuses: No Frontal/maxillary tenderness ENT/Mouth: Ext aud canals clear, TMs without erythema, bulging. Mask in place; oral exam deferred. Hearing normal.  Neck: Supple, thyroid normal.  Respiratory: Respiratory effort normal, BS equal bilaterally without rales, rhonchi, wheezing or stridor.  Cardio: RRR with no MRGs. Brisk peripheral pulses without edema.  Abdomen: Soft, obese abdomen, + BS.  Non tender, no guarding, rebound, hernias, masses. Lymphatics: Non tender without lymphadenopathy.  Musculoskeletal: Full ROM, 5/5 strength, Normal gait Skin: Warm, dry without rashes, lesions, ecchymosis.  Neuro: Cranial nerves intact. No cerebellar  symptoms.  Psych: Awake and oriented X 3, normal affect, Insight and Judgment appropriate.  PHQ-2: 0   Dan Maker, NP 11:02 AM [facility]

## 2019-10-24 ENCOUNTER — Encounter: Payer: Self-pay | Admitting: Adult Health

## 2019-10-24 ENCOUNTER — Ambulatory Visit (INDEPENDENT_AMBULATORY_CARE_PROVIDER_SITE_OTHER): Payer: BC Managed Care – PPO | Admitting: Adult Health

## 2019-10-24 ENCOUNTER — Other Ambulatory Visit: Payer: Self-pay

## 2019-10-24 VITALS — BP 118/68 | HR 70 | Temp 97.5°F | Wt 244.0 lb

## 2019-10-24 DIAGNOSIS — I10 Essential (primary) hypertension: Secondary | ICD-10-CM

## 2019-10-24 DIAGNOSIS — E559 Vitamin D deficiency, unspecified: Secondary | ICD-10-CM | POA: Diagnosis not present

## 2019-10-24 DIAGNOSIS — R7309 Other abnormal glucose: Secondary | ICD-10-CM

## 2019-10-24 DIAGNOSIS — E782 Mixed hyperlipidemia: Secondary | ICD-10-CM

## 2019-10-24 DIAGNOSIS — Z79899 Other long term (current) drug therapy: Secondary | ICD-10-CM

## 2019-10-24 DIAGNOSIS — I824Y1 Acute embolism and thrombosis of unspecified deep veins of right proximal lower extremity: Secondary | ICD-10-CM

## 2019-10-24 DIAGNOSIS — F3341 Major depressive disorder, recurrent, in partial remission: Secondary | ICD-10-CM | POA: Diagnosis not present

## 2019-10-24 NOTE — Patient Instructions (Signed)
Goals    . Exercise 150 min/wk Moderate Activity    . Weight (lb) < 230 lb (104.3 kg)      Recommend avoiding mint/peppermint for a while if your reflux is giving you problems - this can relax your esophageal sphincter and cause more reflux symptoms  Avoid lying down for 2 hours after eating    Gastroesophageal Reflux Disease, Adult Gastroesophageal reflux (GER) happens when acid from the stomach flows up into the tube that connects the mouth and the stomach (esophagus). Normally, food travels down the esophagus and stays in the stomach to be digested. With GER, food and stomach acid sometimes move back up into the esophagus. You may have a disease called gastroesophageal reflux disease (GERD) if the reflux:  Happens often.  Causes frequent or very bad symptoms.  Causes problems such as damage to the esophagus. When this happens, the esophagus becomes sore and swollen (inflamed). Over time, GERD can make small holes (ulcers) in the lining of the esophagus. What are the causes? This condition is caused by a problem with the muscle between the esophagus and the stomach. When this muscle is weak or not normal, it does not close properly to keep food and acid from coming back up from the stomach. The muscle can be weak because of:  Tobacco use.  Pregnancy.  Having a certain type of hernia (hiatal hernia).  Alcohol use.  Certain foods and drinks, such as coffee, chocolate, onions, and peppermint. What increases the risk? You are more likely to develop this condition if you:  Are overweight.  Have a disease that affects your connective tissue.  Use NSAID medicines. What are the signs or symptoms? Symptoms of this condition include:  Heartburn.  Difficult or painful swallowing.  The feeling of having a lump in the throat.  A bitter taste in the mouth.  Bad breath.  Having a lot of saliva.  Having an upset or bloated stomach.  Belching.  Chest pain. Different  conditions can cause chest pain. Make sure you see your doctor if you have chest pain.  Shortness of breath or noisy breathing (wheezing).  Ongoing (chronic) cough or a cough at night.  Wearing away of the surface of teeth (tooth enamel).  Weight loss. How is this treated? Treatment will depend on how bad your symptoms are. Your doctor may suggest:  Changes to your diet.  Medicine.  Surgery. Follow these instructions at home: Eating and drinking   Follow a diet as told by your doctor. You may need to avoid foods and drinks such as: ? Coffee and tea (with or without caffeine). ? Drinks that contain alcohol. ? Energy drinks and sports drinks. ? Bubbly (carbonated) drinks or sodas. ? Chocolate and cocoa. ? Peppermint and mint flavorings. ? Garlic and onions. ? Horseradish. ? Spicy and acidic foods. These include peppers, chili powder, curry powder, vinegar, hot sauces, and BBQ sauce. ? Citrus fruit juices and citrus fruits, such as oranges, lemons, and limes. ? Tomato-based foods. These include red sauce, chili, salsa, and pizza with red sauce. ? Fried and fatty foods. These include donuts, french fries, potato chips, and high-fat dressings. ? High-fat meats. These include hot dogs, rib eye steak, sausage, ham, and bacon. ? High-fat dairy items, such as whole milk, butter, and cream cheese.  Eat small meals often. Avoid eating large meals.  Avoid drinking large amounts of liquid with your meals.  Avoid eating meals during the 2-3 hours before bedtime.  Avoid lying down  right after you eat.  Do not exercise right after you eat. Lifestyle   Do not use any products that contain nicotine or tobacco. These include cigarettes, e-cigarettes, and chewing tobacco. If you need help quitting, ask your doctor.  Try to lower your stress. If you need help doing this, ask your doctor.  If you are overweight, lose an amount of weight that is healthy for you. Ask your doctor about a  safe weight loss goal. General instructions  Pay attention to any changes in your symptoms.  Take over-the-counter and prescription medicines only as told by your doctor. Do not take aspirin, ibuprofen, or other NSAIDs unless your doctor says it is okay.  Wear loose clothes. Do not wear anything tight around your waist.  Raise (elevate) the head of your bed about 6 inches (15 cm).  Avoid bending over if this makes your symptoms worse.  Keep all follow-up visits as told by your doctor. This is important. Contact a doctor if:  You have new symptoms.  You lose weight and you do not know why.  You have trouble swallowing or it hurts to swallow.  You have wheezing or a cough that keeps happening.  Your symptoms do not get better with treatment.  You have a hoarse voice. Get help right away if:  You have pain in your arms, neck, jaw, teeth, or back.  You feel sweaty, dizzy, or light-headed.  You have chest pain or shortness of breath.  You throw up (vomit) and your throw-up looks like blood or coffee grounds.  You pass out (faint).  Your poop (stool) is bloody or black.  You cannot swallow, drink, or eat. Summary  If a person has gastroesophageal reflux disease (GERD), food and stomach acid move back up into the esophagus and cause symptoms or problems such as damage to the esophagus.  Treatment will depend on how bad your symptoms are.  Follow a diet as told by your doctor.  Take all medicines only as told by your doctor. This information is not intended to replace advice given to you by your health care provider. Make sure you discuss any questions you have with your health care provider. Document Revised: 03/22/2018 Document Reviewed: 03/22/2018 Elsevier Patient Education  [place].

## 2019-10-25 LAB — CBC WITH DIFFERENTIAL/PLATELET
Absolute Monocytes: 589 cells/uL (ref 200–950)
Basophils Absolute: 42 cells/uL (ref 0–200)
Basophils Relative: 0.5 %
Eosinophils Absolute: 100 cells/uL (ref 15–500)
Eosinophils Relative: 1.2 %
HCT: 45 % (ref 38.5–50.0)
Hemoglobin: 15.7 g/dL (ref 13.2–17.1)
Lymphs Abs: 1228 cells/uL (ref 850–3900)
MCH: 31.7 pg (ref 27.0–33.0)
MCHC: 34.9 g/dL (ref 32.0–36.0)
MCV: 90.9 fL (ref 80.0–100.0)
MPV: 10.5 fL (ref 7.5–12.5)
Monocytes Relative: 7.1 %
Neutro Abs: 6341 cells/uL (ref 1500–7800)
Neutrophils Relative %: 76.4 %
Platelets: 201 10*3/uL (ref 140–400)
RBC: 4.95 10*6/uL (ref 4.20–5.80)
RDW: 12.3 % (ref 11.0–15.0)
Total Lymphocyte: 14.8 %
WBC: 8.3 10*3/uL (ref 3.8–10.8)

## 2019-10-25 LAB — COMPLETE METABOLIC PANEL WITH GFR
AG Ratio: 1.9 (calc) (ref 1.0–2.5)
ALT: 19 U/L (ref 9–46)
AST: 20 U/L (ref 10–35)
Albumin: 4.3 g/dL (ref 3.6–5.1)
Alkaline phosphatase (APISO): 42 U/L (ref 35–144)
BUN/Creatinine Ratio: 14 (calc) (ref 6–22)
BUN: 19 mg/dL (ref 7–25)
CO2: 26 mmol/L (ref 20–32)
Calcium: 9.4 mg/dL (ref 8.6–10.3)
Chloride: 102 mmol/L (ref 98–110)
Creat: 1.35 mg/dL — ABNORMAL HIGH (ref 0.70–1.25)
GFR, Est African American: 62 mL/min/{1.73_m2} (ref >=60)
GFR, Est Non African American: 54 mL/min/{1.73_m2} — ABNORMAL LOW (ref >=60)
Globulin: 2.3 g/dL (calc) (ref 1.9–3.7)
Glucose, Bld: 103 mg/dL — ABNORMAL HIGH (ref 65–99)
Potassium: 3.9 mmol/L (ref 3.5–5.3)
Sodium: 139 mmol/L (ref 135–146)
Total Bilirubin: 1 mg/dL (ref 0.2–1.2)
Total Protein: 6.6 g/dL (ref 6.1–8.1)

## 2019-10-25 LAB — LIPID PANEL
Cholesterol: 150 mg/dL (ref <200)
HDL: 35 mg/dL — ABNORMAL LOW (ref >=40)
LDL Cholesterol (Calc): 95 mg/dL (calc)
Non-HDL Cholesterol (Calc): 115 mg/dL (calc) (ref <130)
Total CHOL/HDL Ratio: 4.3 (calc) (ref <5.0)
Triglycerides: 106 mg/dL (ref <150)

## 2019-10-25 LAB — TSH: TSH: 1.84 mIU/L (ref 0.40–4.50)

## 2019-10-25 LAB — HEMOGLOBIN A1C
Hgb A1c MFr Bld: 5.9 % of total Hgb — ABNORMAL HIGH (ref <5.7)
Mean Plasma Glucose: 123 (calc)
eAG (mmol/L): 6.8 (calc)

## 2019-10-25 LAB — MAGNESIUM: Magnesium: 1.9 mg/dL (ref 1.5–2.5)

## 2019-12-29 ENCOUNTER — Other Ambulatory Visit: Payer: Self-pay | Admitting: Internal Medicine

## 2019-12-29 DIAGNOSIS — Z9109 Other allergy status, other than to drugs and biological substances: Secondary | ICD-10-CM

## 2019-12-30 ENCOUNTER — Other Ambulatory Visit: Payer: Self-pay | Admitting: Adult Health

## 2019-12-30 DIAGNOSIS — I824Y1 Acute embolism and thrombosis of unspecified deep veins of right proximal lower extremity: Secondary | ICD-10-CM

## 2020-01-28 ENCOUNTER — Encounter: Payer: Self-pay | Admitting: Internal Medicine

## 2020-01-28 NOTE — Patient Instructions (Addendum)
Due to recent changes in healthcare laws, you may see the results of your imaging and laboratory studies on MyChart before your provider has had a chance to review them.  We understand that in some cases there may be results that are confusing or concerning to you. Not all laboratory results come back in the same time frame and the provider may be waiting for multiple results in order to interpret others.  Please give us 48 hours in order for your provider to thoroughly review all the results before contacting the office for clarification of your results.     ++++++++++++++++++++++++++++++++++  Vit D  & Vit C 1,000 mg   are recommended to help protect  against the Covid-19 and other Corona viruses.    Also it's recommended  to take  Zinc 50 mg  to help  protect against the Covid-19   and best place to get  is also on [employer]  and don't pay more than 6-8 cents /pill !   ===================================== Coronavirus (COVID-19) Are you at risk?  Are you at risk for the Coronavirus (COVID-19)?  To be considered HIGH RISK for Coronavirus (COVID-19), you have to meet the following criteria:  . Traveled to [place], [place], [place], [place] or [place]; or in the [place] to [place], [place], [place]  . or [place]; and have fever, cough, and shortness of breath within the last 2 weeks of travel OR . Been in close contact with a person diagnosed with COVID-19 within the last 2 weeks and have  . fever, cough,and shortness of breath .  . IF YOU DO NOT MEET THESE CRITERIA, YOU ARE CONSIDERED LOW RISK FOR COVID-19.  What to do if you are HIGH RISK for COVID-19?  . If you are having a medical emergency, call 911. . Seek medical care right away. Before you go to a doctor's office, urgent care or emergency department, .  call ahead and tell them about your recent travel, contact with someone diagnosed with COVID-19  .  and your symptoms.  . You should receive instructions  from your physician's office regarding next steps of care.  . When you arrive at healthcare provider, tell the healthcare staff immediately you have returned from  . visiting [place], [place], [place], [place] or [place]; or traveled in the [place] to [place], [place],  . [place] or [place] in the last two weeks or you have been in close contact with a person diagnosed with  . COVID-19 in the last 2 weeks.   . Tell the health care staff about your symptoms: fever, cough and shortness of breath. . After you have been seen by a medical provider, you will be either: o Tested for (COVID-19) and discharged home on quarantine except to seek medical care if  o symptoms worsen, and asked to  - Stay home and avoid contact with others until you get your results (4-5 days)  - Avoid travel on public transportation if possible (such as bus, train, or airplane) or o Sent to the Emergency Department by EMS for evaluation, COVID-19 testing  and  o possible admission depending on your condition and test results.  What to do if you are LOW RISK for COVID-19?  Reduce your risk of any infection by using the same precautions used for avoiding the common cold or flu:  . Wash your hands often with soap and warm water for at least 20 seconds.  If soap and water   are not readily available,  . use an alcohol-based hand sanitizer with at least 60% alcohol.  . If coughing or sneezing, cover your mouth and nose by coughing or sneezing into the elbow areas of your shirt or coat, .  into a tissue or into your sleeve (not your hands). . Avoid shaking hands with others and consider head nods or verbal greetings only. . Avoid touching your eyes, nose, or mouth with unwashed hands.  . Avoid close contact with people who are sick. . Avoid places or events with large numbers of people in one location, like concerts or sporting events. . Carefully consider travel plans you have or are making. . If you are planning  any travel outside or inside the [place], visit the CDC's Travelers' Health webpage for the latest health notices. . If you have some symptoms but not all symptoms, continue to monitor at home and seek medical attention  . if your symptoms worsen. . If you are having a medical emergency, call 911.   ++++++++++++++++++++++++++++++++ Recommend Adult Low Dose Aspirin or  coated  Aspirin 81 mg daily  To reduce risk of Colon Cancer 40 %,  Skin Cancer 26 % ,  Melanoma 46%  and  Pancreatic cancer 60% ++++++++++++++++++++++++++++++++ Vitamin D goal  is between 70-100.  Please make sure that you are taking your Vitamin D as directed.  It is very important as a natural anti-inflammatory  helping hair, skin, and nails, as well as reducing stroke and heart attack risk.  It helps your bones and helps with mood. It also decreases numerous cancer risks so please take it as directed.  Low Vit D is associated with a 200-300% higher risk for CANCER  and 200-300% higher risk for HEART   ATTACK  &  STROKE.   ...................................... It is also associated with higher death rate at younger ages,  autoimmune diseases like Rheumatoid arthritis, Lupus, Multiple Sclerosis.    Also many other serious conditions, like depression, Alzheimer's Dementia, infertility, muscle aches, fatigue, fibromyalgia - just to name a few. ++++++++++++++++++++ Recommend the book "The END of DIETING" by Dr Joel Fuhrman  & the book "The END of DIABETES " by Dr Joel Fuhrman At [employer] - get book & Audio CD's    Being diabetic has a  300% increased risk for heart attack, stroke, cancer, and alzheimer- type vascular dementia. It is very important that you work harder with diet by avoiding all foods that are white. Avoid white rice (brown & wild rice is OK), white potatoes (sweetpotatoes in moderation is OK), White bread or wheat bread or anything made out of white flour like bagels, donuts, rolls, buns, biscuits, cakes,  pastries, cookies, pizza crust, and pasta (made from white flour & egg whites) - vegetarian pasta or spinach or wheat pasta is OK. Multigrain breads like Arnold's or Pepper[employer], or multigrain sandwich thins or flatbreads.  Diet, exercise and weight loss can reverse and cure diabetes in the early stages.  Diet, exercise and weight loss is very important in the control and prevention of complications of diabetes which affects every system in your body, ie. Brain - dementia/stroke, eyes - glaucoma/blindness, heart - heart attack/heart failure, kidneys - dialysis, stomach - gastric paralysis, intestines - malabsorption, nerves - severe painful neuritis, circulation - gangrene & loss of a leg(s), and finally cancer and Alzheimers.    I recommend avoid fried & greasy foods,  sweets/candy, white rice (brown or wild rice or Quinoa is OK), white potatoes (  sweet potatoes are OK) - anything made from white flour - bagels, doughnuts, rolls, buns, biscuits,white and wheat breads, pizza crust and traditional pasta made of white flour & egg white(vegetarian pasta or spinach or wheat pasta is OK).  Multi-grain bread is OK - like multi-grain flat bread or sandwich thins. Avoid alcohol in excess. Exercise is also important.    Eat all the vegetables you want - avoid meat, especially red meat and dairy - especially cheese.  Cheese is the most concentrated form of trans-fats which is the worst thing to clog up our arteries. Veggie cheese is OK which can be found in the fresh produce section at [employer] or [employer] or [employer]  +++++++++++++++++++++ DASH Eating Plan  DASH stands for "Dietary Approaches to Stop Hypertension."   The DASH eating plan is a healthy eating plan that has been shown to reduce high blood pressure (hypertension). Additional health benefits may include reducing the risk of type 2 diabetes mellitus, heart disease, and stroke. The DASH eating plan may also help with weight loss. WHAT DO I  NEED TO KNOW ABOUT THE DASH EATING PLAN? For the DASH eating plan, you will follow these general guidelines:  Choose foods with a percent daily value for sodium of less than 5% (as listed on the food label).  Use salt-free seasonings or herbs instead of table salt or sea salt.  Check with your health care provider or pharmacist before using salt substitutes.  Eat lower-sodium products, often labeled as "lower sodium" or "no salt added."  Eat fresh foods.  Eat more vegetables, fruits, and low-fat dairy products.  Choose whole grains. Look for the word "whole" as the first word in the ingredient list.  Choose fish   Limit sweets, desserts, sugars, and sugary drinks.  Choose heart-healthy fats.  Eat veggie cheese   Eat more home-cooked food and less restaurant, buffet, and fast food.  Limit fried foods.  Cook foods using methods other than frying.  Limit canned vegetables. If you do use them, rinse them well to decrease the sodium.  When eating at a restaurant, ask that your food be prepared with less salt, or no salt if possible.                      WHAT FOODS CAN I EAT? Read Dr Joel Fuhrman's books on The End of Dieting & The End of Diabetes  Grains Whole grain or whole wheat bread. Brown rice. Whole grain or whole wheat pasta. Quinoa, bulgur, and whole grain cereals. Low-sodium cereals. Corn or whole wheat flour tortillas. Whole grain cornbread. Whole grain crackers. Low-sodium crackers.  Vegetables Fresh or frozen vegetables (raw, steamed, roasted, or grilled). Low-sodium or reduced-sodium tomato and vegetable juices. Low-sodium or reduced-sodium tomato sauce and paste. Low-sodium or reduced-sodium canned vegetables.   Fruits All fresh, canned (in natural juice), or frozen fruits.  Protein Products  All fish and seafood.  Dried beans, peas, or lentils. Unsalted nuts and seeds. Unsalted canned beans.  Dairy Low-fat dairy products, such as skim or 1% milk, 2% or  reduced-fat cheeses, low-fat ricotta or cottage cheese, or plain low-fat yogurt. Low-sodium or reduced-sodium cheeses.  Fats and Oils Tub margarines without trans fats. Light or reduced-fat mayonnaise and salad dressings (reduced sodium). Avocado. Safflower, olive, or canola oils. Natural peanut or almond butter.  Other Unsalted popcorn and pretzels. The items listed above may not be a complete list of recommended foods or beverages. Contact your dietitian for more   options.  +++++++++++++++  WHAT FOODS ARE NOT RECOMMENDED? Grains/ White flour or wheat flour White bread. White pasta. White rice. Refined cornbread. Bagels and croissants. Crackers that contain trans fat.  Vegetables  Creamed or fried vegetables. Vegetables in a . Regular canned vegetables. Regular canned tomato sauce and paste. Regular tomato and vegetable juices.  Fruits Dried fruits. Canned fruit in light or heavy syrup. Fruit juice.  Meat and Other Protein Products Meat in general - RED meat & White meat.  Fatty cuts of meat. Ribs, chicken wings, all processed meats as bacon, sausage, bologna, salami, fatback, hot dogs, bratwurst and packaged luncheon meats.  Dairy Whole or 2% milk, cream, half-and-half, and cream cheese. Whole-fat or sweetened yogurt. Full-fat cheeses or blue cheese. Non-dairy creamers and whipped toppings. Processed cheese, cheese spreads, or cheese curds.  Condiments Onion and garlic salt, seasoned salt, table salt, and sea salt. Canned and packaged gravies. Worcestershire sauce. Tartar sauce. Barbecue sauce. Teriyaki sauce. Soy sauce, including reduced sodium. Steak sauce. Fish sauce. Oyster sauce. Cocktail sauce. Horseradish. Ketchup and mustard. Meat flavorings and tenderizers. Bouillon cubes. Hot sauce. Tabasco sauce. Marinades. Taco seasonings. Relishes.  Fats and Oils Butter, stick margarine, lard, shortening and bacon fat. Coconut, palm kernel, or palm oils. Regular salad  dressings.  Pickles and olives. Salted popcorn and pretzels.  The items listed above may not be a complete list of foods and beverages to avoid.   Bleeding Precautions When on Anticoagulant Therapy, Adult Anticoagulant therapy, also called blood thinner therapy, is medicine that helps to prevent and treat blood clots. The medicine works by stopping blood clots from forming or growing. Blood clots that form in your blood vessels can be dangerous. They can break loose and travel to the heart, lungs, or brain. This increases the risk of a heart attack, stroke, or blocked lung artery (pulmonary embolism). Anticoagulants also increase the risk of bleeding. Try to protect yourself from cuts and other injuries that can cause bleeding. It is important to take anticoagulants exactly as told by your health care provider. Why do I need to be on anticoagulant therapy? You may need this medicine if you are at risk of developing a blood clot. Conditions that increase your risk of a blood clot include:  Being born with heart disease or a heart malformation (congenital heart disease).  Developing heart disease.  Having had surgery, such as valve replacement.  Having had a serious accident or other type of severe injury (trauma).  Having certain types of cancer.  Having certain diseases that can increase blood clotting.  Having a high risk of stroke or heart attack.  Having atrial fibrillation (AF). What are the common anticoagulant medicines? There are several types of anticoagulant medicines. The most common types are:  Medicines that you take by mouth (oral medicines), such as: ? Warfarin. ? Novel oral anticoagulants (NOACs), such as:  Direct thrombin inhibitors (dabigatran).  Factor Xa inhibitors (apixaban, edoxaban, and rivaroxaban).  Injections, such as: ? Unfractionated heparin. ? Low molecular weight heparin. These anticoagulants work in different ways to prevent blood clots. They  also have different risks and side effects. What do I need to remember while on anticoagulant therapy? Taking anticoagulants  Take your medicine at the same time every day. If you forget to take your medicine, take it as soon as you remember. Do not double your dosage of medicine if you miss a whole day. Take your normal dose and call your health care provider.  Do not stop taking  your medicine unless your health care provider approves. Stopping the medicine can increase your risk of developing a blood clot. Taking other medicines  Take over-the-counter and prescriptions medicines only as told by your health care provider.  Do not take over-the-counter NSAIDs, including aspirin and ibuprofen, while you are on anticoagulant therapy. These medicines increase your risk of dangerous bleeding.  Get approval from your health care provider before you start taking any new medicines, vitamins, or herbal products. Some of these could interfere with your therapy. General instructions  Keep all follow-up visits as told by your health care provider. This is important.  If you are pregnant or trying to get pregnant, talk with a health care provider about anticoagulants. Some of these medicines are not safe to take during pregnancy.  Tell all health care providers, including your dentist, that you are on anticoagulant therapy. It is especially important to tell providers before you have any surgery, medical procedures, or dental work done. What precautions should I take?   Be very careful when using knives, scissors, or other sharp objects.  Use an electric razor instead of a blade.  Do not use toothpicks.  Use a soft-bristled toothbrush. Brush your teeth gently.  Always wear shoes outdoors and wear slippers indoors.  Be careful when cutting your fingernails and toenails.  Place bath mats in the bathroom. If possible, install handrails as well.  Wear gloves while you do yard work.  Wear your  seat belt.  Prevent falls by removing loose rugs and extension cords from areas where you walk. Use a cane or walker if you need it.  Avoid constipation by: ? Drinking enough fluid to keep your urine clear or pale yellow. ? Eating foods that are high in fiber, such as fresh fruits and vegetables, whole grains, and beans. ? Limiting foods that are high in fat and processed sugars, such as fried and sweet foods.  Do not play contact sports or participate in other activities that have a high risk for injury. What other precautions are important if on warfarin therapy? If you are taking a type of anticoagulant called warfarin, make sure you:  Work with a diet and nutrition specialist (dietitian) to make an eating plan. Do not make any sudden changes to your diet after you have started your eating plan.  Do not drink alcohol. It can interfere with your medicine and increase your risk of an injury that causes bleeding.  Get regular blood tests as told by your health care provider. What are some questions to ask my health care provider?  Why do I need anticoagulant therapy?  What is the best anticoagulant therapy for my condition?  How long will I need anticoagulant therapy?  What are the side effects of anticoagulant therapy?  When should I take my medicine? What should I do if I forget to take it?  Will I need to have regular blood tests?  Do I need to change my diet? Are there foods or drinks that I should avoid?  What activities are safe for me?  What should I do if I want to get pregnant? Contact a health care provider if:  You miss a dose of medicine: ? And you are not sure what to do. ? For more than one day.  You have: ? Menstrual bleeding that is heavier than normal. ? Bloody or brown urine. ? Easy bruising. ? Black and tarry stool or bright red stool. ? Side effects from your medicine.  You  feel weak or dizzy.  You become pregnant. Get help right away  if:  You have bleeding that will not stop within 20 minutes from: ? The nose. ? The gums. ? A cut on the skin.  You have a severe headache or stomachache.  You vomit or cough up blood.  You fall or hit your head. Summary  Anticoagulant therapy, also called blood thinner therapy, is medicine that helps to prevent and treat blood clots.  Anticoagulants work in different ways to prevent blood clots. They also have different risks and side effects.  Talk with your health care provider about any precautions that you should take while on anticoagulant therapy.

## 2020-01-28 NOTE — Progress Notes (Signed)
History of Present Illness:       This very nice 69 y.o. MWM presents for 6 month follow up with HTN, HLD, Pre-Diabetes and Vitamin D Deficiency.       Patient was treated for a RLE DVT in June 2020 on Xarelto x 3 months thru Sept and again in 07/12/19 with recurrent DVT, RLE swelling & (+) Rt Homans sign & was restarted on Xarelto.       Patient is treated for HTN (2001) & BP has been controlled at home. Today's BP is at goal - 110/74. Patient has had no complaints of any cardiac type chest pain, palpitations, dyspnea / orthopnea / PND, dizziness, claudication, or dependent edema.      Hyperlipidemia is controlled with diet & meds. Patient denies myalgias or other med SE's. Last Lipids were at goal:  Lab Results  Component Value Date   CHOL 150 10/24/2019   HDL 35 (L) 10/24/2019   LDLCALC 95 10/24/2019   TRIG 106 10/24/2019   CHOLHDL 4.3 10/24/2019    Also, the patient has Morbid Obesity (BMI 35+) and  history of PreDiabetes (A1c 6.2% / 2011) and has had no symptoms of reactive hypoglycemia, diabetic polys, paresthesias or visual blurring.  Patient has lost 20# over the last6 months which he attributes to better eating. Last A1c was not at at goal:  Lab Results  Component Value Date   HGBA1C 5.9 (H) 10/24/2019   Wt Readings from Last 3 Encounters:  01/29/20 235 lb 12.8 oz (107 kg)  10/24/19 244 lb (110.7 kg)  08/10/19 254 lb 9.6 oz (115.5 kg)         Further, the patient also has history of Vitamin D Deficiency ("16" / 2008) and supplements vitamin D without any suspected side-effects. Last vitamin D was at goal:  Lab Results  Component Value Date   VD25OH 49 07/19/2019    Current Outpatient Medications on File Prior to Visit  Medication Sig  . B Complex-C (SUPER B COMPLEX PO) Take 1 tablet by mouth daily.  . bisoprolol-hydrochlorothiazide (ZIAC) 5-6.25 MG tablet Take 1 tablet Daily for BP  . Calcium Carbonate Antacid (TUMS PO) Take by mouth.  . Cholecalciferol  (VITAMIN D-3) 5000 UNITS TABS Take 10,000 Units by mouth daily.  . famotidine (PEPCID) 20 MG tablet Take 1 tablet 2 x /day with meals for Acid Indigestion & Reflux  . fluticasone (FLONASE) 50 MCG/ACT nasal spray SPRAY 2 SPRAYS INTO EACH NOSTRIL EVERY DAY  . furosemide (LASIX) 40 MG tablet Take 1 tab daily as needed for edema.  Marland Kitchen loratadine (CLARITIN) 10 MG tablet Take 10 mg by mouth daily.  . montelukast (SINGULAIR) 10 MG tablet Take 1 tablet Daily for Allergies  . XARELTO 20 MG TABS tablet TAKE 1 TAB BY MOUTH DAILY FOR TREATMENT OF RECURRENT DVT. DO NOT TAKE WITH ASPIRIN.  Marland Kitchen Zinc 50 MG TABS Take by mouth daily.  . ASPIRIN 81 PO Take by mouth daily.   No current facility-administered medications on file prior to visit.    Allergies  Allergen Reactions  . Penicillins     REACTION: unknown    PMHx:   Past Medical History:  Diagnosis Date  . Allergic rhinitis   . BPH (benign prostatic hyperplasia)   . GERD (gastroesophageal reflux disease)   . Hyperlipidemia   . Hypertension   . Hypogonadism, male   . Kidney stones   . Morbid obesity ([place])   . Pre-diabetes   .  Vitamin D deficiency     Immunization History  Administered Date(s) Administered  . DTaP 01/26/2000  . Influenza Whole 06/22/2013  . Influenza, High Dose Seasonal PF 06/27/2015, 06/27/2017, 06/20/2018, 05/13/2019  . Influenza-Unspecified 07/13/2014, 06/26/2016, 05/13/2019  . PFIZER SARS-COV-2 Vaccination 11/03/2019, 11/24/2019  . PPD Test 05/29/2014, 07/30/2015  . Pneumococcal Conjugate-13 05/13/2017  . Pneumococcal Polysaccharide-23 06/20/2018  . Tdap 09/06/2014  . Zoster 04/22/2013    Past Surgical History:  Procedure Laterality Date  . FLEXIBLE SIGMOIDOSCOPY  1992  . KNEE SURGERY Right 1977  . TONSILLECTOMY AND ADENOIDECTOMY  1960    FHx:    Reviewed / unchanged  SHx:    Reviewed / unchanged   Systems Review:  Constitutional: Denies fever, chills, wt changes, headaches, insomnia, fatigue, night  sweats, change in appetite. Eyes: Denies redness, blurred vision, diplopia, discharge, itchy, watery eyes.  ENT: Denies discharge, congestion, post nasal drip, epistaxis, sore throat, earache, hearing loss, dental pain, tinnitus, vertigo, sinus pain, snoring.  CV: Denies chest pain, palpitations, irregular heartbeat, syncope, dyspnea, diaphoresis, orthopnea, PND, claudication or edema. Respiratory: denies cough, dyspnea, DOE, pleurisy, hoarseness, laryngitis, wheezing.  Gastrointestinal: Denies dysphagia, odynophagia, heartburn, reflux, water brash, abdominal pain or cramps, nausea, vomiting, bloating, diarrhea, constipation, hematemesis, melena, hematochezia  or hemorrhoids. Genitourinary: Denies dysuria, frequency, urgency, nocturia, hesitancy, discharge, hematuria or flank pain. Musculoskeletal: Denies arthralgias, myalgias, stiffness, jt. swelling, pain, limping or strain/sprain.  Skin: Denies pruritus, rash, hives, warts, acne, eczema or change in skin lesion(s). Neuro: No weakness, tremor, incoordination, spasms, paresthesia or pain. Psychiatric: Denies confusion, memory loss or sensory loss. Endo: Denies change in weight, skin or hair change.  Heme/Lymph: No excessive bleeding, bruising or enlarged lymph nodes.  Physical Exam  BP 110/74   Pulse 64   Temp (!) 97 F (36.1 C)   Resp 16   Ht 5\' 10"  (1.778 m)   Wt 235 lb 12.8 oz (107 kg)   BMI 33.83 kg/m   Appears  well nourished, well groomed  and in no distress.  Eyes: PERRLA, EOMs, conjunctiva no swelling or erythema. Sinuses: No frontal/maxillary tenderness ENT/Mouth: EAC's clear, TM's nl w/o erythema, bulging. Nares clear w/o erythema, swelling, exudates. Oropharynx clear without erythema or exudates. Oral hygiene is good. Tongue normal, non obstructing. Hearing intact.  Neck: Supple. Thyroid not palpable. Car 2+/2+ without bruits, nodes or JVD. Chest: Respirations nl with BS clear & equal w/o rales, rhonchi, wheezing or  stridor.  Cor: Heart sounds normal w/ regular rate and rhythm without sig. murmurs, gallops, clicks or rubs. Peripheral pulses normal and equal  without edema.  Abdomen: Soft & bowel sounds normal. Non-tender w/o guarding, rebound, hernias, masses or organomegaly.  Lymphatics: Unremarkable.  Musculoskeletal: Full ROM all peripheral extremities, joint stability, 5/5 strength and normal gait.  Skin: Warm, dry without exposed rashes, lesions or ecchymosis apparent.  Neuro: Cranial nerves intact, reflexes equal bilaterally. Sensory-motor testing grossly intact. Tendon reflexes grossly intact.  Pysch: Alert & oriented x 3.  Insight and judgement nl & appropriate. No ideations.  Assessment and Plan:  1. Essential hypertension  - Continue medication, monitor blood pressure at home.  - Continue DASH diet.  Reminder to go to the ER if any CP,  SOB, nausea, dizziness, severe HA, changes vision/speech.  - CBC with Differential/Platelet - COMPLETE METABOLIC PANEL WITH GFR - Magnesium - TSH  2. Hyperlipidemia, mixed  - Continue diet/meds, exercise,& lifestyle modifications.  - Continue monitor periodic cholesterol/liver & renal functions   - Lipid panel - TSH  -  Continue diet, exercise  - Lifestyle modifications.  - Monitor appropriate labs.  3. Abnormal glucose  - Hemoglobin A1c - Insulin, random  4. Vitamin D deficiency  - Continue supplementation.   5. Prediabetes  - Hemoglobin A1c - Insulin, random  6. 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 Medication management  - CBC with Differential/Platelet - COMPLETE METABOLIC PANEL WITH GFR - Magnesium - Lipid panel - TSH - Hemoglobin A1c - Insulin, random       Discussed  regular exercise, BP monitoring, weight control to achieve/maintain BMI less than 25 and discussed med and SE's. Recommended labs to assess and monitor clinical status with further disposition pending results of labs.  I  discussed the assessment and treatment plan with the patient. The patient was provided an opportunity to ask questions and all were answered. The patient agreed with the plan and demonstrated an understanding of the instructions.  I provided over 30 minutes of exam, counseling, chart review and  complex critical decision making.   Marinus Maw, MD

## 2020-01-29 ENCOUNTER — Encounter: Payer: Self-pay | Admitting: Internal Medicine

## 2020-01-29 ENCOUNTER — Other Ambulatory Visit: Payer: Self-pay

## 2020-01-29 ENCOUNTER — Ambulatory Visit: Payer: BC Managed Care – PPO | Admitting: Internal Medicine

## 2020-01-29 VITALS — BP 110/74 | HR 64 | Temp 97.0°F | Resp 16 | Ht 70.0 in | Wt 235.8 lb

## 2020-01-29 DIAGNOSIS — R7309 Other abnormal glucose: Secondary | ICD-10-CM

## 2020-01-29 DIAGNOSIS — E559 Vitamin D deficiency, unspecified: Secondary | ICD-10-CM

## 2020-01-29 DIAGNOSIS — Z79899 Other long term (current) drug therapy: Secondary | ICD-10-CM | POA: Diagnosis not present

## 2020-01-29 DIAGNOSIS — I10 Essential (primary) hypertension: Secondary | ICD-10-CM | POA: Diagnosis not present

## 2020-01-29 DIAGNOSIS — I82401 Acute embolism and thrombosis of unspecified deep veins of right lower extremity: Secondary | ICD-10-CM

## 2020-01-29 DIAGNOSIS — E782 Mixed hyperlipidemia: Secondary | ICD-10-CM

## 2020-01-29 DIAGNOSIS — R7303 Prediabetes: Secondary | ICD-10-CM

## 2020-01-30 LAB — COMPLETE METABOLIC PANEL WITH GFR
AG Ratio: 1.8 (calc) (ref 1.0–2.5)
ALT: 22 U/L (ref 9–46)
AST: 23 U/L (ref 10–35)
Albumin: 4.4 g/dL (ref 3.6–5.1)
Alkaline phosphatase (APISO): 49 U/L (ref 35–144)
BUN/Creatinine Ratio: 14 (calc) (ref 6–22)
BUN: 18 mg/dL (ref 7–25)
CO2: 24 mmol/L (ref 20–32)
Calcium: 9.5 mg/dL (ref 8.6–10.3)
Chloride: 102 mmol/L (ref 98–110)
Creat: 1.26 mg/dL — ABNORMAL HIGH (ref 0.70–1.25)
GFR, Est African American: 67 mL/min/{1.73_m2} (ref >=60)
GFR, Est Non African American: 58 mL/min/{1.73_m2} — ABNORMAL LOW (ref >=60)
Globulin: 2.4 g/dL (calc) (ref 1.9–3.7)
Glucose, Bld: 107 mg/dL — ABNORMAL HIGH (ref 65–99)
Potassium: 4.2 mmol/L (ref 3.5–5.3)
Sodium: 138 mmol/L (ref 135–146)
Total Bilirubin: 1.1 mg/dL (ref 0.2–1.2)
Total Protein: 6.8 g/dL (ref 6.1–8.1)

## 2020-01-30 LAB — CBC WITH DIFFERENTIAL/PLATELET
Absolute Monocytes: 549 cells/uL (ref 200–950)
Basophils Absolute: 31 cells/uL (ref 0–200)
Basophils Relative: 0.5 %
Eosinophils Absolute: 122 cells/uL (ref 15–500)
Eosinophils Relative: 2 %
HCT: 46.5 % (ref 38.5–50.0)
Hemoglobin: 16.1 g/dL (ref 13.2–17.1)
Lymphs Abs: 1684 cells/uL (ref 850–3900)
MCH: 32.4 pg (ref 27.0–33.0)
MCHC: 34.6 g/dL (ref 32.0–36.0)
MCV: 93.6 fL (ref 80.0–100.0)
MPV: 10.3 fL (ref 7.5–12.5)
Monocytes Relative: 9 %
Neutro Abs: 3715 cells/uL (ref 1500–7800)
Neutrophils Relative %: 60.9 %
Platelets: 203 10*3/uL (ref 140–400)
RBC: 4.97 10*6/uL (ref 4.20–5.80)
RDW: 12.5 % (ref 11.0–15.0)
Total Lymphocyte: 27.6 %
WBC: 6.1 10*3/uL (ref 3.8–10.8)

## 2020-01-30 LAB — HEMOGLOBIN A1C
Hgb A1c MFr Bld: 5.6 % of total Hgb (ref <5.7)
Mean Plasma Glucose: 114 (calc)
eAG (mmol/L): 6.3 (calc)

## 2020-01-30 LAB — LIPID PANEL
Cholesterol: 165 mg/dL (ref <200)
HDL: 39 mg/dL — ABNORMAL LOW (ref >=40)
LDL Cholesterol (Calc): 107 mg/dL (calc) — ABNORMAL HIGH
Non-HDL Cholesterol (Calc): 126 mg/dL (calc) (ref <130)
Total CHOL/HDL Ratio: 4.2 (calc) (ref <5.0)
Triglycerides: 91 mg/dL (ref <150)

## 2020-01-30 LAB — MAGNESIUM: Magnesium: 2 mg/dL (ref 1.5–2.5)

## 2020-01-30 LAB — TSH: TSH: 1.24 mIU/L (ref 0.40–4.50)

## 2020-01-30 LAB — INSULIN, RANDOM: Insulin: 9.1 u[IU]/mL

## 2020-02-28 ENCOUNTER — Other Ambulatory Visit: Payer: Self-pay | Admitting: Internal Medicine

## 2020-02-28 DIAGNOSIS — K219 Gastro-esophageal reflux disease without esophagitis: Secondary | ICD-10-CM

## 2020-03-23 ENCOUNTER — Other Ambulatory Visit: Payer: Self-pay | Admitting: Internal Medicine

## 2020-03-23 ENCOUNTER — Other Ambulatory Visit: Payer: Self-pay | Admitting: Physician Assistant

## 2020-03-23 DIAGNOSIS — I824Y1 Acute embolism and thrombosis of unspecified deep veins of right proximal lower extremity: Secondary | ICD-10-CM

## 2020-03-23 DIAGNOSIS — Z9109 Other allergy status, other than to drugs and biological substances: Secondary | ICD-10-CM

## 2020-04-02 ENCOUNTER — Ambulatory Visit: Payer: BC Managed Care – PPO | Admitting: Internal Medicine

## 2020-04-02 ENCOUNTER — Encounter: Payer: Self-pay | Admitting: Internal Medicine

## 2020-04-02 ENCOUNTER — Other Ambulatory Visit: Payer: Self-pay

## 2020-04-02 VITALS — BP 126/82 | HR 68 | Temp 97.6°F | Resp 18 | Ht 70.0 in | Wt 250.0 lb

## 2020-04-02 DIAGNOSIS — J0410 Acute tracheitis without obstruction: Secondary | ICD-10-CM | POA: Diagnosis not present

## 2020-04-02 MED ORDER — DEXAMETHASONE 4 MG PO TABS: ORAL_TABLET | ORAL | 0 refills | Status: DC

## 2020-04-02 MED ORDER — AZITHROMYCIN 250 MG PO TABS: ORAL_TABLET | ORAL | 1 refills | Status: DC

## 2020-04-02 MED ORDER — PROMETHAZINE-CODEINE 6.25-10 MG/5ML PO SYRP: ORAL_SOLUTION | ORAL | 1 refills | Status: DC

## 2020-04-02 NOTE — Patient Instructions (Signed)
Acute Bronchitis, Adult  Acute bronchitis is sudden or acute swelling of the air tubes (bronchi) in the lungs. Acute bronchitis causes these tubes to fill with mucus, which can make it hard to breathe. It can also cause coughing or wheezing. In adults, acute bronchitis usually goes away within 2 weeks. A cough caused by bronchitis may last up to 3 weeks. Smoking, allergies, and asthma can make the condition worse. What are the causes? This condition can be caused by germs and by substances that irritate the lungs, including:  Cold and flu viruses. The most common cause of this condition is the virus that causes the common cold.  Bacteria.  Substances that irritate the lungs, including: ? Smoke from cigarettes and other forms of tobacco. ? Dust and pollen. ? Fumes from chemical products, gases, or burned fuel. ? Other materials that pollute indoor or outdoor air.  Close contact with someone who has acute bronchitis. What increases the risk? The following factors may make you more likely to develop this condition:  A weak body's defense system, also called the immune system.  A condition that affects your lungs and breathing, such as asthma. What are the signs or symptoms? Common symptoms of this condition include:  Lung and breathing problems, such as: ? Coughing. This may bring up clear, yellow, or green mucus from your lungs (sputum). ? Wheezing. ? Having too much mucus in your lungs (chest congestion). ? Having shortness of breath.  A fever.  Chills.  Aches and pains, including: ? Tightness in your chest and other body aches. ? A sore throat. How is this diagnosed? This condition is usually diagnosed based on:  Your symptoms and medical history.  A physical exam. You may also have other tests, including tests to rule out other conditions, such pneumonia. These tests include:  A test of lung function.  Test of a mucus sample to look for the presence of  bacteria.  Tests to check the oxygen level in your blood.  Blood tests.  Chest X-ray. How is this treated? Most cases of acute bronchitis clear up over time without treatment. Your health care provider may recommend:  Drinking more fluids. This can thin your mucus, which may improve your breathing.  Taking a medicine for a fever or cough.  Using a device that gets medicine into your lungs (inhaler) to help improve breathing and control coughing.  Using a vaporizer or a humidifier. These are machines that add water to the air to help you breathe better. Follow these instructions at home: Activity  Get plenty of rest.  Return to your normal activities as told by your health care provider. Ask your health care provider what activities are safe for you. Lifestyle  Drink enough fluid to keep your urine pale yellow.  Do not drink alcohol.  Do not use any products that contain nicotine or tobacco, such as cigarettes, e-cigarettes, and chewing tobacco. If you need help quitting, ask your health care provider. Be aware that: ? Your bronchitis will get worse if you smoke or breathe in other people's smoke (secondhand smoke). ? Your lungs will heal faster if you quit smoking. General instructions   Take over-the-counter and prescription medicines only as told by your health care provider.  Use an inhaler, vaporizer, or humidifier as told by your health care provider.  If you have a sore throat, gargle with a salt-water mixture 3-4 times a day or as needed. To make a salt-water mixture, completely dissolve -1 tsp (3-6   g) of salt in 1 cup (237 mL) of warm water.  Keep all follow-up visits as told by your health care provider. This is important. How is this prevented? To lower your risk of getting this condition again:  Wash your hands often with soap and water. If soap and water are not available, use hand sanitizer.  Avoid contact with people who have cold symptoms.  Try not to  touch your mouth, nose, or eyes with your hands.  Avoid places where there are fumes from chemicals. Breathing these fumes will make your condition worse.  Get the flu shot every year. Contact a health care provider if:  Your symptoms do not improve after 2 weeks of treatment.  You vomit more than once or twice.  You have symptoms of dehydration such as: ? Dark urine. ? Dry skin or eyes. ? Increased thirst. ? Headaches. ? Confusion. ? Muscle cramps. Get help right away if you:  Cough up blood.  Feel pain in your chest.  Have severe shortness of breath.  Faint or keep feeling like you are going to faint.  Have a severe headache.  Have fever or chills that get worse. These symptoms may represent a serious problem that is an emergency. Do not wait to see if the symptoms will go away. Get medical help right away. Call your local emergency services (911 in the [place] Do not drive yourself to the hospital. Summary  Acute bronchitis is sudden (acute) inflammation of the air tubes (bronchi) between the windpipe and the lungs. In adults, acute bronchitis usually goes away within 2 weeks, although coughing may last 3 weeks or longer  Take over-the-counter and prescription medicines only as told by your health care provider.  Drink enough fluid to keep your urine pale yellow.  Contact a health care provider if your symptoms do not improve after 2 weeks of treatment.  Get help right away if you cough up blood, faint, or have chest pain or shortness of breath. This information is not intended to replace advice given to you by your health care provider. Make sure you discuss any questions you have with your health care provider. Document Revised: 05/28/2019 Document Reviewed: 04/06/2019 Elsevier Patient Education  2020 [employer]  

## 2020-04-02 NOTE — Progress Notes (Signed)
   History of Present Illness:    Patient is a very nice 69 yo MWM presenting with a 1 week prodrome of worsening cough - minimally productive and w/o fever , chills, sweats, rah or dyspnea.   Medications  .  bisoprolol-hydrochlorothiazide (ZIAC) 5-6.25 MG tablet, Take 1 tablet Daily for BP .  furosemide (LASIX) 40 MG tablet, Take 1 tab daily as needed for edema. .  fluticasone (FLONASE) 50 MCG/ACT nasal spray, SPRAY 2 SPRAYS INTO EACH NOSTRIL EVERY DAY .  loratadine (CLARITIN) 10 MG tablet, Take 10 mg by mouth daily. .  ASPIRIN 81 PO, Take by mouth daily. .  rivaroxaban (XARELTO) 20 MG TABS tablet, Take 1 tablet Daily to Prevent Blood Clots  .  B Complex-C (SUPER B COMPLEX PO), Take 1 tablet by mouth daily. .  Calcium Carbonate Antacid (TUMS PO), Take by mouth. .  Cholecalciferol (VITAMIN D-3) 5000 UNITS TABS, Take 10,000 Units by mouth daily. .  famotidine (PEPCID) 20 MG tablet, TAKE 1 TABLET TWICE DAILY WITH MEALS FOR ACID INDIGESTION & REFLUX .  Zinc 50 MG TABS, Take by mouth daily.  Problem list He has Essential hypertension; Hyperlipidemia; Abnormal glucose; Vitamin D deficiency; Medication management; GERD ; Depression, major, recurrent, in partial remission (HCC); Morbid obesity (HCC); and Deep vein thrombosis (DVT) of proximal vein of right lower extremity (HCC) on their problem list.   Observations/Objective:  BP 126/82   Pulse 68   Temp 97.6 F (36.4 C)   Resp 18   Ht 5\' 10"  (1.778 m)   Wt 250 lb (113.4 kg)   BMI 35.87 kg/m   Persistent dry cough. No respiratory distress.  HEENT - EAC's/TM's - Nl. No sinus tenderness. N/O/P - clear. Neck - supple. No B, N, JVD Chest - Raspy dry cough. Few scattered post-tussive expiratory wheezes. No Rhonchi.  Cor - Nl HS. RRR w/o sig M.  No edema. MS- FROM w/o deformities.  Gait Nl. Neuro -  Nl w/o focal abnormalities. Skin - Clear w/o rash, cyanosis.   Assessment and Plan:  1. Tracheitis  - dexamethasone (DECADRON) 4 MG  tablet; Take 1 tab 3 x day - 3 days, then 2 x day - 3 days, then 1 tab daily  Dispense: 20 tablet  - azithromycin (ZITHROMAX) 250 MG tablet; Take 2 tablets with Food on  Day 1, then 1 tablet Daily with Food for Infection  Dispense: 6 each; Refill: 1  - promethazine-codeine (PHENERGAN WITH CODEINE) 6.25-10 MG/5ML syrup; Take 1 or 2 teaspoonful every 4 hours as needed for Cough / Congestion  Dispense: 360 mL; Refill: 1       I discussed the assessment and treatment plan with the patient. The patient was provided an opportunity to ask questions and all were answered. The patient agreed with the plan and demonstrated an understanding of the instructions.       The patient was advised to call back or seek an in-person evaluation if the symptoms worsen or if the condition fails to improve as anticipated.   04-04-1976, MD

## 2020-04-29 NOTE — Progress Notes (Signed)
FOLLOW UP  Assessment and Plan:   Hypertension Well controlled with current medications  Monitor blood pressure at home; patient to call if consistently greater than 130/80 Continue DASH diet.   Reminder to go to the ER if any CP, SOB, nausea, dizziness, severe HA, changes vision/speech, left arm numbness and tingling and jaw pain.  Cholesterol Controlled by lifestyle modification Continue low cholesterol diet and exercise.  Check lipid panel.   Prediabetes Discussed disease progression and risks Discussed diet/exercise, weight management and risk modification  Obesity with co morbidities Long discussion about weight loss, diet, and exercise He has worked to get his weight back down, he will continue to eat more at home and cutting back on carbs/portions Will follow up in 3 months  Vitamin D Def/ osteoporosis prevention At goal at recent check; continue to recommend supplementation for goal of 70-100 Defer vitamin D level  Depression/anxiety Doing well off of medications Lifestyle discussed: diet/exerise, sleep hygiene, stress management, hydration  DVT of RLE Continue xarelto at this time. discussed increased risk of GI bleed No concerns with excess bleeding at this time; no falls Continue for the time being, may discuss stopping at CPE   Continue diet and meds as discussed. Further disposition pending results of labs. Discussed med's effects and SE's.   Over 30 minutes of exam, counseling, chart review, and critical decision making was performed.   Future Appointments  Date Time Provider Department Center  08/01/2020 10:00 AM [name], MD [id number] None    ----------------------------------------------------------------------------------------------------------------------  HPI 69 y.o. male  presents for 3 month follow up on hypertension, cholesterol, prediabetes, obesity, depression, GERD and vitamin D deficiency.   Patient was treated for a RLE DVT in  June 2020 on Xarelto x 3, ha reoccurrence 10/15,  wearing compression hose daily.  Doing well on xarelto at this time.    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   BMI is Body mass index is 34.87 kg/m., he has been working on diet and exercise, elliptical daily, reports has lost 10 lb from peak weight at home since early January. Eating out/take out less and feels this is really helping. Initial goal <230 lb, goal for this year <200lb.  Wt Readings from Last 3 Encounters:  05/01/20 243 lb (110.2 kg)  04/02/20 250 lb (113.4 kg)  01/29/20 235 lb 12.8 oz (107 kg)   His blood pressure has been controlled at home, today their BP is BP: 120/76  He does not workout but is active outside. He denies chest pain, shortness of breath, dizziness.   He is not on cholesterol medication and denies myalgias. His cholesterol is at goal. The cholesterol last visit was:   Lab Results  Component Value Date   CHOL 165 01/29/2020   HDL 39 (L) 01/29/2020   LDLCALC 107 (H) 01/29/2020   TRIG 91 01/29/2020   CHOLHDL 4.2 01/29/2020    He has been working on diet for prediabetes, and denies increased appetite, nausea, paresthesia of the feet, polydipsia, polyuria, visual disturbances and vomiting. Last A1C in the office was:  Lab Results  Component Value Date   HGBA1C 5.6 01/29/2020    He has CKD IIIa monitored at this office:  Lab Results  Component Value Date   GFRNONAA 58 (L) 01/29/2020   Patient is on Vitamin D supplement and at goal:   Lab Results  Component Value Date   VD25OH 91 07/19/2019       Current Medications:  Current  Outpatient Medications on File Prior to Visit  Medication Sig  . ASPIRIN 81 PO Take by mouth daily.  . B Complex-C (SUPER B COMPLEX PO) Take 1 tablet by mouth daily.  . bisoprolol-hydrochlorothiazide (ZIAC) 5-6.25 MG tablet Take 1 tablet Daily for BP  . Calcium Carbonate Antacid (TUMS PO) Take by mouth.  . Cholecalciferol (VITAMIN D-3) 5000  UNITS TABS Take 10,000 Units by mouth daily.  . famotidine (PEPCID) 20 MG tablet TAKE 1 TABLET TWICE DAILY WITH MEALS FOR ACID INDIGESTION & REFLUX  . fluticasone (FLONASE) 50 MCG/ACT nasal spray SPRAY 2 SPRAYS INTO EACH NOSTRIL EVERY DAY  . furosemide (LASIX) 40 MG tablet Take 1 tab daily as needed for edema.  Marland Kitchen loratadine (CLARITIN) 10 MG tablet Take 10 mg by mouth daily.  . montelukast (SINGULAIR) 10 MG tablet TAKE 1 TABLET  DAILY FOR ALLERGIES  . rivaroxaban (XARELTO) 20 MG TABS tablet Take 1 tablet Daily to Prevent Blood Clots  . Zinc 50 MG TABS Take by mouth daily.   No current facility-administered medications on file prior to visit.     Allergies:  Allergies  Allergen Reactions  . Penicillins     REACTION: unknown     Medical History:  Past Medical History:  Diagnosis Date  . Allergic rhinitis   . BPH (benign prostatic hyperplasia)   . GERD (gastroesophageal reflux disease)   . Hyperlipidemia   . Hypertension   . Hypogonadism, male   . Kidney stones   . Morbid obesity (HCC)   . Pre-diabetes   . Vitamin D deficiency    Family history- Reviewed and unchanged Social history- Reviewed and unchanged   Review of Systems:  Review of Systems  Constitutional: Negative for malaise/fatigue and weight loss.  HENT: Negative for hearing loss and tinnitus.   Eyes: Negative for blurred vision and double vision.  Respiratory: Negative for cough, shortness of breath and wheezing.   Cardiovascular: Negative for chest pain, palpitations, orthopnea, claudication and leg swelling.  Gastrointestinal: Negative for abdominal pain, blood in stool, constipation, diarrhea, heartburn, melena, nausea and vomiting.  Genitourinary: Negative.   Musculoskeletal: Negative for joint pain and myalgias.  Skin: Negative for rash.  Neurological: Negative for dizziness, tingling, sensory change, weakness and headaches.  Endo/Heme/Allergies: Negative for polydipsia.  Psychiatric/Behavioral:  Negative.   All other systems reviewed and are negative.   Physical Exam: BP 120/76   Pulse 73   Temp 97.7 F (36.5 C)   Wt 243 lb (110.2 kg)   SpO2 94%   BMI 34.87 kg/m  Wt Readings from Last 3 Encounters:  05/01/20 243 lb (110.2 kg)  04/02/20 250 lb (113.4 kg)  01/29/20 235 lb 12.8 oz (107 kg)   General Appearance: Well nourished, in no apparent distress. Eyes: PERRLA, EOMs, conjunctiva no swelling or erythema Sinuses: No Frontal/maxillary tenderness ENT/Mouth: Ext aud canals clear, TMs without erythema, bulging. Mask in place; oral exam deferred. Hearing normal.  Neck: Supple, thyroid normal.  Respiratory: Respiratory effort normal, BS equal bilaterally without rales, rhonchi, wheezing or stridor.  Cardio: RRR with no MRGs. Brisk peripheral pulses without edema.  Abdomen: Soft, obese abdomen, + BS.  Non tender, no guarding, rebound, hernias, masses. Lymphatics: Non tender without lymphadenopathy.  Musculoskeletal: Full ROM, 5/5 strength, Normal gait Skin: Warm, dry without rashes, lesions, ecchymosis.  Neuro: Cranial nerves intact. No cerebellar symptoms.  Psych: Awake and oriented X 3, normal affect, Insight and Judgment appropriate.  PHQ-2: 0   Quentin Mulling, PA-C 8:52 AM [facility]

## 2020-05-01 ENCOUNTER — Encounter: Payer: Self-pay | Admitting: Physician Assistant

## 2020-05-01 ENCOUNTER — Ambulatory Visit: Payer: BC Managed Care – PPO | Admitting: Physician Assistant

## 2020-05-01 ENCOUNTER — Other Ambulatory Visit: Payer: Self-pay

## 2020-05-01 VITALS — BP 120/76 | HR 73 | Temp 97.7°F | Wt 243.0 lb

## 2020-05-01 DIAGNOSIS — I10 Essential (primary) hypertension: Secondary | ICD-10-CM | POA: Diagnosis not present

## 2020-05-01 DIAGNOSIS — F3341 Major depressive disorder, recurrent, in partial remission: Secondary | ICD-10-CM | POA: Diagnosis not present

## 2020-05-01 DIAGNOSIS — R7309 Other abnormal glucose: Secondary | ICD-10-CM | POA: Diagnosis not present

## 2020-05-01 DIAGNOSIS — E559 Vitamin D deficiency, unspecified: Secondary | ICD-10-CM

## 2020-05-01 DIAGNOSIS — E782 Mixed hyperlipidemia: Secondary | ICD-10-CM | POA: Diagnosis not present

## 2020-05-01 DIAGNOSIS — Z79899 Other long term (current) drug therapy: Secondary | ICD-10-CM

## 2020-05-01 NOTE — Patient Instructions (Signed)
General eating tips  What to Avoid . Avoid added sugars o Often added sugar can be found in processed foods such as many condiments, dry cereals, cakes, cookies, chips, crisps, crackers, candies, sweetened drinks, etc.  o Read labels and AVOID/DECREASE use of foods with the following in their ingredient list: Sugar, fructose, high fructose corn syrup, sucrose, glucose, maltose, dextrose, molasses, cane sugar, brown sugar, any type of syrup, agave nectar, etc.   . Avoid snacking in between meals- drink water or if you feel you need a snack, pick a high water content snack such as cucumbers, watermelon, or any veggie.  Marland Kitchen Avoid foods made with flour o If you are going to eat food made with flour, choose those made with whole-grains; and, minimize your consumption as much as is tolerable . Avoid processed foods o These foods are generally stocked in the middle of the grocery store.  o Focus on shopping on the perimeter of the grocery.  What to Include . Vegetables o GREEN LEAFY VEGETABLES: Kale, spinach, mustard greens, collard greens, cabbage, broccoli, etc. o OTHER: Asparagus, cauliflower, eggplant, carrots, peas, Brussel sprouts, tomatoes, bell peppers, zucchini, beets, cucumbers, etc. . Grains, seeds, and legumes o Beans: kidney beans, black eyed peas, garbanzo beans, black beans, pinto beans, etc. o Whole, unrefined grains: brown rice, barley, bulgur, oatmeal, etc. . Healthy fats  o Avoid highly processed fats such as vegetable oil o Examples of healthy fats: avocado, olives, virgin olive oil, dark chocolate (?72% Cocoa), nuts (peanuts, almonds, walnuts, cashews, pecans, etc.) o Please still do small amount of these healthy fats, they are dense in calories.  . Low - Moderate Intake of Animal Sources of Protein o Meat sources: chicken, [place], salmon, tuna. Limit to 4 ounces of meat at one time or the size of your palm. o Consider limiting dairy sources, but when choosing dairy focus on:  PLAIN [place] yogurt, cottage cheese, high-protein milk . Fruit Choose berries      When it comes to diets, agreement about the perfect plan isn't easy to find, even among the experts. Experts at the [facility] [employer] developed an idea known as the Healthy Eating Plate. Just imagine a plate divided into logical, healthy portions.  The emphasis is on diet quality:  Load up on vegetables and fruits - one-half of your plate: Aim for color and variety, and remember that potatoes don't count.  Go for whole grains - one-quarter of your plate: Whole wheat, barley, wheat berries, quinoa, oats, brown rice, and foods made with them. If you want pasta, go with whole wheat pasta.  Protein power - one-quarter of your plate: Fish, chicken, beans, and nuts are all healthy, versatile protein sources. Limit red meat.  The diet, however, does go beyond the plate, offering a few other suggestions.  Use healthy plant oils, such as olive, canola, soy, corn, sunflower and peanut. Check the labels, and avoid partially hydrogenated oil, which have unhealthy trans fats.  If you're thirsty, drink water. Coffee and tea are good in moderation, but skip sugary drinks and limit milk and dairy products to one or two daily servings.  The type of carbohydrate in the diet is more important than the amount. Some sources of carbohydrates, such as vegetables, fruits, whole grains, and beans--are healthier than others.  Finally, stay active.

## 2020-05-02 LAB — CBC WITH DIFFERENTIAL/PLATELET
Absolute Monocytes: 538 cells/uL (ref 200–950)
Basophils Absolute: 18 cells/uL (ref 0–200)
Basophils Relative: 0.4 %
Eosinophils Absolute: 170 cells/uL (ref 15–500)
Eosinophils Relative: 3.7 %
HCT: 42.6 % (ref 38.5–50.0)
Hemoglobin: 15 g/dL (ref 13.2–17.1)
Lymphs Abs: 1495 cells/uL (ref 850–3900)
MCH: 32.8 pg (ref 27.0–33.0)
MCHC: 35.2 g/dL (ref 32.0–36.0)
MCV: 93.2 fL (ref 80.0–100.0)
MPV: 10 fL (ref 7.5–12.5)
Monocytes Relative: 11.7 %
Neutro Abs: 2378 cells/uL (ref 1500–7800)
Neutrophils Relative %: 51.7 %
Platelets: 198 10*3/uL (ref 140–400)
RBC: 4.57 10*6/uL (ref 4.20–5.80)
RDW: 12.6 % (ref 11.0–15.0)
Total Lymphocyte: 32.5 %
WBC: 4.6 10*3/uL (ref 3.8–10.8)

## 2020-05-02 LAB — COMPLETE METABOLIC PANEL WITH GFR
AG Ratio: 1.7 (calc) (ref 1.0–2.5)
ALT: 26 U/L (ref 9–46)
AST: 26 U/L (ref 10–35)
Albumin: 4 g/dL (ref 3.6–5.1)
Alkaline phosphatase (APISO): 50 U/L (ref 35–144)
BUN/Creatinine Ratio: 14 (calc) (ref 6–22)
BUN: 18 mg/dL (ref 7–25)
CO2: 30 mmol/L (ref 20–32)
Calcium: 9.4 mg/dL (ref 8.6–10.3)
Chloride: 102 mmol/L (ref 98–110)
Creat: 1.3 mg/dL — ABNORMAL HIGH (ref 0.70–1.25)
GFR, Est African American: 65 mL/min/{1.73_m2} (ref >=60)
GFR, Est Non African American: 56 mL/min/{1.73_m2} — ABNORMAL LOW (ref >=60)
Globulin: 2.4 g/dL (calc) (ref 1.9–3.7)
Glucose, Bld: 140 mg/dL — ABNORMAL HIGH (ref 65–99)
Potassium: 3.9 mmol/L (ref 3.5–5.3)
Sodium: 139 mmol/L (ref 135–146)
Total Bilirubin: 1.5 mg/dL — ABNORMAL HIGH (ref 0.2–1.2)
Total Protein: 6.4 g/dL (ref 6.1–8.1)

## 2020-05-02 LAB — LIPID PANEL
Cholesterol: 151 mg/dL (ref <200)
HDL: 42 mg/dL (ref >=40)
LDL Cholesterol (Calc): 92 mg/dL (calc)
Non-HDL Cholesterol (Calc): 109 mg/dL (calc) (ref <130)
Total CHOL/HDL Ratio: 3.6 (calc) (ref <5.0)
Triglycerides: 83 mg/dL (ref <150)

## 2020-05-02 LAB — TSH: TSH: 1.88 mIU/L (ref 0.40–4.50)

## 2020-05-02 LAB — HEMOGLOBIN A1C
Hgb A1c MFr Bld: 6.4 % of total Hgb — ABNORMAL HIGH (ref <5.7)
Mean Plasma Glucose: 137 (calc)
eAG (mmol/L): 7.6 (calc)

## 2020-05-06 ENCOUNTER — Other Ambulatory Visit: Payer: Self-pay | Admitting: Internal Medicine

## 2020-05-06 DIAGNOSIS — I10 Essential (primary) hypertension: Secondary | ICD-10-CM

## 2020-06-16 ENCOUNTER — Other Ambulatory Visit: Payer: Self-pay | Admitting: Internal Medicine

## 2020-06-16 DIAGNOSIS — Z9109 Other allergy status, other than to drugs and biological substances: Secondary | ICD-10-CM

## 2020-06-17 ENCOUNTER — Other Ambulatory Visit: Payer: Self-pay | Admitting: Internal Medicine

## 2020-06-17 DIAGNOSIS — I824Y1 Acute embolism and thrombosis of unspecified deep veins of right proximal lower extremity: Secondary | ICD-10-CM

## 2020-07-31 ENCOUNTER — Encounter: Payer: Self-pay | Admitting: Internal Medicine

## 2020-07-31 NOTE — Patient Instructions (Signed)

## 2020-07-31 NOTE — Progress Notes (Signed)
Annual  Screening/Preventative Visit  & Comprehensive Evaluation & Examination      This very nice 69 y.o.  MWM presents for a Screening /Preventative Visit & comprehensive evaluation and management of multiple medical co-morbidities.  Patient has been followed for HTN, HLD, Prediabetes and Vitamin D Deficiency.      Patient was treated for a RLE DVT in June 2020 on Xarelto x 3 months thru Sept and again in 07/12/19 with recurrent DVT, RLE swelling & (+) Rt Homans sign & continues  on Xarelto.      HTN predates since  2001 . Patient's BP has been controlled at home.  Today's BP is at goal - 126/86. Patient has CKD3a (GFR 56) consequent of his HT CVD.  Patient denies any cardiac symptoms as chest pain, palpitations, shortness of breath, dizziness or ankle swelling.      Patient's hyperlipidemia is controlled with diet and medications. Patient denies myalgias or other medication SE's. Last lipids were at goal:  Lab Results  Component Value Date   CHOL 151 05/01/2020   HDL 42 05/01/2020   LDLCALC 92 05/01/2020   TRIG 83 05/01/2020   CHOLHDL 3.6 05/01/2020       Patient has Morbid Obesity (35+)  And consequent PreDiabetes(A1c 6.2% / 2011)  and patient denies reactive hypoglycemic symptoms, visual blurring, diabetic polys or paresthesias. Last A1c was not at goal:   Lab Results  Component Value Date   HGBA1C 6.4 (H) 05/01/2020    Wt Readings from Last 3 Encounters:  05/01/20 243 lb (110.2 kg)  04/02/20 250 lb (113.4 kg)  01/29/20 235 lb 12.8 oz (107 kg)       Finally, patient has history of Vitamin D Deficiency("16" /2008)and last vitamin D was at goal:   Lab Results  Component Value Date   VD25OH 91 07/19/2019    Current Outpatient Medications on File Prior to Visit  Medication Sig  . ASPIRIN 81 PO Take  daily.  . SUPER B COMPLEX  Take 1 tablet  daily.  . bisoprolol-hctz 5-6.25 MG  TAKE 1 TABLET DAILY   . TUMS  Take Daily  . VITAMIN D 5000 U  Take 10,000 Units   daily.  . famotidine  20 MG  TAKE 1 TABLET TWICE DAILY WITH MEALS   . FLONASE nasal spray Instill 2 sprays each nostril  Daily  . furosemide  40 MG tablet Take 1 tab daily as needed for edema.  Marland Kitchen loratadine  10 MG tablet Take 10 mg  daily.  . montelukast  10 MG tablet Take    1 tablet    Daily    for Allergies  . XARELTO 20 MG TABS  TAKE 1 TABLET DAILY   . Zinc 50 MG TABS Take  daily.    Allergies  Allergen Reactions  . Penicillins     REACTION: unknown    Past Medical History:  Diagnosis Date  . Allergic rhinitis   . BPH (benign prostatic hyperplasia)   . GERD (gastroesophageal reflux disease)   . Hyperlipidemia   . Hypertension   . Hypogonadism, male   . Kidney stones   . Morbid obesity (HCC)   . Pre-diabetes   . Vitamin D deficiency    Health Maintenance  Topic Date Due  . Hepatitis C Screening  Never done  . COLONOSCOPY  02/15/2018  . TETANUS/TDAP  09/06/2024  . INFLUENZA VACCINE  Completed  . COVID-19 Vaccine  Completed  . PNA vac Low Risk Adult  Completed   Immunization History  Administered Date(s) Administered  . DTaP 01/26/2000  . Influenza Whole 06/22/2013  . Influenza, High Dose Seasonal PF 06/27/2015, 06/27/2017, 06/20/2018, 05/13/2019, 06/20/2020  . Influenza-Unspecified 07/13/2014, 06/26/2016, 05/13/2019  . PFIZER SARS-COV-2 Vaccination 11/03/2019, 11/24/2019, 06/20/2020  . PPD Test 05/29/2014, 07/30/2015  . Pneumococcal Conjugate-13 05/13/2017  . Pneumococcal Polysaccharide-23 06/20/2018  . Tdap 09/06/2014  . Zoster 04/22/2013   Last Colon - 02/16/2008 - Dr Marina Goodell -10 yr f/u overdue May 2019  Past Surgical History:  Procedure Laterality Date  . FLEXIBLE SIGMOIDOSCOPY  1992  . KNEE SURGERY Right 1977  . TONSILLECTOMY AND ADENOIDECTOMY  1960   Family History  Problem Relation Age of Onset  . Cancer Father        lung  . Diabetes Father   . Diabetes Brother    Social History   Socioeconomic History  . Marital status: Married    Spouse  name: Judeth Cornfield  . Number of children: 1 son & 1 daughter  Occupational History     Tobacco Use  . Smoking status: Former Smoker    Quit date: 09/28/1975    Years since quitting: 44.8  . Smokeless tobacco: Never Used  Substance and Sexual Activity  . Alcohol use: Yes    Comment: occasional  . Drug use: No  . Sexual activity: Not on file    ROS Constitutional: Denies fever, chills, weight loss/gain, headaches, insomnia,  night sweats or change in appetite. Does c/o fatigue. Eyes: Denies redness, blurred vision, diplopia, discharge, itchy or watery eyes.  ENT: Denies discharge, congestion, post nasal drip, epistaxis, sore throat, earache, hearing loss, dental pain, Tinnitus, Vertigo, Sinus pain or snoring.  Cardio: Denies chest pain, palpitations, irregular heartbeat, syncope, dyspnea, diaphoresis, orthopnea, PND, claudication or edema Respiratory: denies cough, dyspnea, DOE, pleurisy, hoarseness, laryngitis or wheezing.  Gastrointestinal: Denies dysphagia, heartburn, reflux, water brash, pain, cramps, nausea, vomiting, bloating, diarrhea, constipation, hematemesis, melena, hematochezia, jaundice or hemorrhoids Genitourinary: Denies dysuria, frequency, urgency, nocturia, hesitancy, discharge, hematuria or flank pain Musculoskeletal: Denies arthralgia, myalgia, stiffness, Jt. Swelling, pain, limp or strain/sprain. Denies Falls. Skin: Denies puritis, rash, hives, warts, acne, eczema or change in skin lesion Neuro: No weakness, tremor, incoordination, spasms, paresthesia or pain Psychiatric: Denies confusion, memory loss or sensory loss. Denies Depression. Endocrine: Denies change in weight, skin, hair change, nocturia, and paresthesia, diabetic polys, visual blurring or hyper / hypo glycemic episodes.  Heme/Lymph: No excessive bleeding, bruising or enlarged lymph nodes.  Physical Exam  BP 126/86   Pulse 63   Temp (!) 97.3 F (36.3 C)   Ht 5\' 10"  (1.778 m)   Wt 247 lb (112 kg)   SpO2  97%   BMI 35.44 kg/m   General Appearance: Over nourished and well groomed and in no apparent distress.  Eyes: PERRLA, EOMs, conjunctiva no swelling or erythema, normal fundi and vessels. Sinuses: No frontal/maxillary tenderness ENT/Mouth: EACs patent / TMs  nl. Nares clear without erythema, swelling, mucoid exudates. Oral hygiene is good. No erythema, swelling, or exudate. Tongue normal, non-obstructing. Tonsils not swollen or erythematous. Hearing normal.  Neck: Supple, thyroid not palpable. No bruits, nodes or JVD. Respiratory: Respiratory effort normal.  BS equal and clear bilateral without rales, rhonci, wheezing or stridor. Cardio: Heart sounds are normal with regular rate and rhythm and no murmurs, rubs or gallops. Peripheral pulses are normal and equal bilaterally without edema. No aortic or femoral bruits. Chest: symmetric with normal excursions and percussion.  Abdomen: Soft, with Nl bowel sounds.  Nontender, no guarding, rebound, hernias, masses, or organomegaly.  Lymphatics: Non tender without lymphadenopathy.  Musculoskeletal: Full ROM all peripheral extremities, joint stability, 5/5 strength, and normal gait. Skin: Warm and dry without rashes, lesions, cyanosis, clubbing or  ecchymosis.  Neuro: Cranial nerves intact, reflexes equal bilaterally. Normal muscle tone, no cerebellar symptoms. Sensation intact.  Pysch: Alert and oriented X 3 with normal affect, insight and judgment appropriate.   Assessment and Plan  1. Annual Preventative/Screening Exam    2. Essential hypertension  - EKG 12-Lead - [place], RETROPERITNL ABD,  LTD - Urinalysis, Routine w reflex microscopic - Microalbumin / creatinine urine ratio - PTH, intact and calcium - CBC with Differential/Platelet - COMPLETE METABOLIC PANEL WITH GFR - Magnesium - TSH  3. Hyperlipidemia, mixed  - EKG 12-Lead - [place], RETROPERITNL ABD,  LTD - Lipid panel - TSH  4. Abnormal glucose  - EKG 12-Lead - [place], RETROPERITNL  ABD,  LTD - Hemoglobin A1c - Insulin, random  5. Vitamin D deficiency  - VITAMIN D 25 Hydroxy  6. Class 2 obesity due to excess calories with body mass index (BMI) of 35.44   7. 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 BPH with obstruction/lower urinary tract symptoms  - PSA  9. Screening for colorectal cancer  - POC Hemoccult Bld/Stl  10. Screening for ischemic heart disease  - EKG 12-Lead  11. Screening for prostate cancer  - PSA  12. FH: hypertension  - EKG 12-Lead - [place], RETROPERITNL ABD,  LTD  13. Former smoker  - EKG 12-Lead - [place], RETROPERITNL ABD,  LTD  14. Screening for AAA (aortic abdominal aneurysm)  - [place], RETROPERITNL ABD,  LTD  15. Medication management  - Urinalysis, Routine w reflex microscopic - Microalbumin / creatinine urine ratio - CBC with Differential/Platelet - COMPLETE METABOLIC PANEL WITH GFR - Magnesium - Lipid panel - TSH - Hemoglobin A1c - Insulin, random - VITAMIN D 25 Hydroxy  16. Chronic renal impairment, stage 3a (HCC)  - Urinalysis, Routine w reflex microscopic - Microalbumin / creatinine urine ratio - PTH, intact and calcium            Patient was counseled in prudent diet, weight control to achieve/maintain BMI less than 25, BP monitoring, regular exercise and medications as discussed.  Discussed med effects and SE's. Routine screening labs and tests as requested with regular follow-up as recommended. Over 40 minutes of exam, counseling, chart review and high complex critical decision making was performed   Marinus Maw, MD

## 2020-08-01 ENCOUNTER — Other Ambulatory Visit: Payer: Self-pay

## 2020-08-01 ENCOUNTER — Ambulatory Visit: Payer: BC Managed Care – PPO | Admitting: Internal Medicine

## 2020-08-01 ENCOUNTER — Encounter: Payer: Self-pay | Admitting: Internal Medicine

## 2020-08-01 VITALS — BP 126/86 | HR 63 | Temp 97.3°F | Ht 70.0 in | Wt 247.0 lb

## 2020-08-01 DIAGNOSIS — Z13220 Encounter for screening for lipoid disorders: Secondary | ICD-10-CM | POA: Diagnosis not present

## 2020-08-01 DIAGNOSIS — Z125 Encounter for screening for malignant neoplasm of prostate: Secondary | ICD-10-CM

## 2020-08-01 DIAGNOSIS — R350 Frequency of micturition: Secondary | ICD-10-CM | POA: Diagnosis not present

## 2020-08-01 DIAGNOSIS — E559 Vitamin D deficiency, unspecified: Secondary | ICD-10-CM

## 2020-08-01 DIAGNOSIS — I82401 Acute embolism and thrombosis of unspecified deep veins of right lower extremity: Secondary | ICD-10-CM

## 2020-08-01 DIAGNOSIS — Z1211 Encounter for screening for malignant neoplasm of colon: Secondary | ICD-10-CM

## 2020-08-01 DIAGNOSIS — N401 Enlarged prostate with lower urinary tract symptoms: Secondary | ICD-10-CM | POA: Diagnosis not present

## 2020-08-01 DIAGNOSIS — Z1329 Encounter for screening for other suspected endocrine disorder: Secondary | ICD-10-CM

## 2020-08-01 DIAGNOSIS — R7309 Other abnormal glucose: Secondary | ICD-10-CM

## 2020-08-01 DIAGNOSIS — Z0000 Encounter for general adult medical examination without abnormal findings: Secondary | ICD-10-CM | POA: Diagnosis not present

## 2020-08-01 DIAGNOSIS — E6609 Other obesity due to excess calories: Secondary | ICD-10-CM

## 2020-08-01 DIAGNOSIS — Z1389 Encounter for screening for other disorder: Secondary | ICD-10-CM | POA: Diagnosis not present

## 2020-08-01 DIAGNOSIS — Z131 Encounter for screening for diabetes mellitus: Secondary | ICD-10-CM | POA: Diagnosis not present

## 2020-08-01 DIAGNOSIS — Z79899 Other long term (current) drug therapy: Secondary | ICD-10-CM | POA: Diagnosis not present

## 2020-08-01 DIAGNOSIS — I10 Essential (primary) hypertension: Secondary | ICD-10-CM | POA: Diagnosis not present

## 2020-08-01 DIAGNOSIS — Z87891 Personal history of nicotine dependence: Secondary | ICD-10-CM | POA: Diagnosis not present

## 2020-08-01 DIAGNOSIS — Z8249 Family history of ischemic heart disease and other diseases of the circulatory system: Secondary | ICD-10-CM | POA: Diagnosis not present

## 2020-08-01 DIAGNOSIS — Z136 Encounter for screening for cardiovascular disorders: Secondary | ICD-10-CM | POA: Diagnosis not present

## 2020-08-01 DIAGNOSIS — N138 Other obstructive and reflux uropathy: Secondary | ICD-10-CM

## 2020-08-01 DIAGNOSIS — N1831 Chronic kidney disease, stage 3a: Secondary | ICD-10-CM

## 2020-08-01 DIAGNOSIS — Z1212 Encounter for screening for malignant neoplasm of rectum: Secondary | ICD-10-CM

## 2020-08-01 DIAGNOSIS — Z0001 Encounter for general adult medical examination with abnormal findings: Secondary | ICD-10-CM

## 2020-08-01 DIAGNOSIS — E782 Mixed hyperlipidemia: Secondary | ICD-10-CM

## 2020-08-01 DIAGNOSIS — Z6835 Body mass index (BMI) 35.0-35.9, adult: Secondary | ICD-10-CM

## 2020-08-02 NOTE — Progress Notes (Signed)
========================================================== -   Test results slightly outside the reference range are not unusual. If there is anything important, I will review this with you,  otherwise it is considered normal test values.  If you have further questions,  please do not hesitate to contact me at the office or via My Chart.  ==========================================================  -  PSA - Low - Great ! ==========================================================  -  Total Chol = 163  -  Excellent   - Very low risk for Heart Attack  / Stroke ==========================================================  - LDL Chol = 100  is Sl Elevated (Ideal or Goal is less than 70)  - So . . . . . . . . . . . . - Recommend low cholesterol diet   - Cholesterol only comes from animal sources  - ie. meat, dairy, egg yolks  - Eat all the vegetables you want.  - Avoid meat, especially red meat - Beef AND Pork .  - Avoid cheese & dairy - milk & ice cream.     - Cheese is the most concentrated form of trans-fats which  is the worst thing to clog up our arteries.   - Veggie cheese is OK which can be found in the fresh  produce section at [employer] or [employer] or [employer] ==========================================================  - A1c = 6.1%  - Blood sugar and A1c are elevated in the borderline and  early or pre-diabetes range which has the same   300% increased risk for heart attack, stroke, cancer and   alzheimer- type vascular dementia as full blown diabetes.   But the good news is that diet, exercise with  weight loss can cure the early diabetes at this point. ==========================================================  -  Vitamin D = 86 - Excellent  ==========================================================  -  All Else - CBC - Kidneys - Electrolytes - Liver - Magnesium & Thyroid    - all  Normal / OK ====================================================

## 2020-08-04 LAB — CBC WITH DIFFERENTIAL/PLATELET
Absolute Monocytes: 525 cells/uL (ref 200–950)
Basophils Absolute: 31 cells/uL (ref 0–200)
Basophils Relative: 0.5 %
Eosinophils Absolute: 159 cells/uL (ref 15–500)
Eosinophils Relative: 2.6 %
HCT: 48.9 % (ref 38.5–50.0)
Hemoglobin: 16.7 g/dL (ref 13.2–17.1)
Lymphs Abs: 1769 cells/uL (ref 850–3900)
MCH: 31.5 pg (ref 27.0–33.0)
MCHC: 34.2 g/dL (ref 32.0–36.0)
MCV: 92.3 fL (ref 80.0–100.0)
MPV: 10.5 fL (ref 7.5–12.5)
Monocytes Relative: 8.6 %
Neutro Abs: 3617 cells/uL (ref 1500–7800)
Neutrophils Relative %: 59.3 %
Platelets: 220 10*3/uL (ref 140–400)
RBC: 5.3 10*6/uL (ref 4.20–5.80)
RDW: 11.9 % (ref 11.0–15.0)
Total Lymphocyte: 29 %
WBC: 6.1 10*3/uL (ref 3.8–10.8)

## 2020-08-04 LAB — PTH, INTACT AND CALCIUM
Calcium: 10.1 mg/dL (ref 8.6–10.3)
PTH: 19 pg/mL (ref 14–64)

## 2020-08-04 LAB — COMPLETE METABOLIC PANEL WITH GFR
AG Ratio: 1.8 (calc) (ref 1.0–2.5)
ALT: 18 U/L (ref 9–46)
AST: 16 U/L (ref 10–35)
Albumin: 4.4 g/dL (ref 3.6–5.1)
Alkaline phosphatase (APISO): 57 U/L (ref 35–144)
BUN: 21 mg/dL (ref 7–25)
CO2: 29 mmol/L (ref 20–32)
Calcium: 10.1 mg/dL (ref 8.6–10.3)
Chloride: 103 mmol/L (ref 98–110)
Creat: 1.24 mg/dL (ref 0.70–1.25)
GFR, Est African American: 68 mL/min/{1.73_m2} (ref >=60)
GFR, Est Non African American: 59 mL/min/{1.73_m2} — ABNORMAL LOW (ref >=60)
Globulin: 2.4 g/dL (calc) (ref 1.9–3.7)
Glucose, Bld: 129 mg/dL — ABNORMAL HIGH (ref 65–99)
Potassium: 4.4 mmol/L (ref 3.5–5.3)
Sodium: 140 mmol/L (ref 135–146)
Total Bilirubin: 0.7 mg/dL (ref 0.2–1.2)
Total Protein: 6.8 g/dL (ref 6.1–8.1)

## 2020-08-04 LAB — HEMOGLOBIN A1C
Hgb A1c MFr Bld: 6.1 % of total Hgb — ABNORMAL HIGH (ref <5.7)
Mean Plasma Glucose: 128 (calc)
eAG (mmol/L): 7.1 (calc)

## 2020-08-04 LAB — VITAMIN D 25 HYDROXY (VIT D DEFICIENCY, FRACTURES): Vit D, 25-Hydroxy: 86 ng/mL (ref 30–100)

## 2020-08-04 LAB — TSH: TSH: 2.27 mIU/L (ref 0.40–4.50)

## 2020-08-04 LAB — URINALYSIS, ROUTINE W REFLEX MICROSCOPIC
Bilirubin Urine: NEGATIVE
Glucose, UA: NEGATIVE
Hgb urine dipstick: NEGATIVE
Ketones, ur: NEGATIVE
Leukocytes,Ua: NEGATIVE
Nitrite: NEGATIVE
Protein, ur: NEGATIVE
Specific Gravity, Urine: 1.009 (ref 1.001–1.03)
pH: 5.5 (ref 5.0–8.0)

## 2020-08-04 LAB — LIPID PANEL
Cholesterol: 163 mg/dL (ref <200)
HDL: 44 mg/dL (ref >=40)
LDL Cholesterol (Calc): 100 mg/dL (calc) — ABNORMAL HIGH
Non-HDL Cholesterol (Calc): 119 mg/dL (calc) (ref <130)
Total CHOL/HDL Ratio: 3.7 (calc) (ref <5.0)
Triglycerides: 98 mg/dL (ref <150)

## 2020-08-04 LAB — MICROALBUMIN / CREATININE URINE RATIO
Creatinine, Urine: 56 mg/dL (ref 20–320)
Microalb Creat Ratio: 5 mcg/mg creat (ref <30)
Microalb, Ur: 0.3 mg/dL

## 2020-08-04 LAB — MAGNESIUM: Magnesium: 2 mg/dL (ref 1.5–2.5)

## 2020-08-04 LAB — INSULIN, RANDOM: Insulin: 14 u[IU]/mL

## 2020-08-04 LAB — PSA: PSA: 0.73 ng/mL (ref <=4.0)

## 2020-09-08 ENCOUNTER — Other Ambulatory Visit: Payer: Self-pay | Admitting: Internal Medicine

## 2020-09-08 DIAGNOSIS — Z9109 Other allergy status, other than to drugs and biological substances: Secondary | ICD-10-CM

## 2020-10-01 ENCOUNTER — Ambulatory Visit: Payer: BC Managed Care – PPO | Admitting: Adult Health

## 2020-10-01 ENCOUNTER — Other Ambulatory Visit: Payer: Self-pay

## 2020-10-01 ENCOUNTER — Encounter: Payer: Self-pay | Admitting: Adult Health

## 2020-10-01 VITALS — BP 136/72 | HR 63 | Temp 97.0°F | Wt 259.0 lb

## 2020-10-01 DIAGNOSIS — R0789 Other chest pain: Secondary | ICD-10-CM | POA: Diagnosis not present

## 2020-10-01 NOTE — Patient Instructions (Signed)
Try voltaren gel or aspercreme 3-4 times a day   Ice and heat  Bracing is ok  Ok if continues to improve - let me know if new worsening GI sx  Miralax with 2 glasses of water to get bowels to move    Chest Wall Pain Chest wall pain is pain in or around the bones and muscles of your chest. Sometimes, an injury causes this pain. Excessive coughing or overuse of arm and chest muscles may also cause chest wall pain. Sometimes, the cause may not be known. This pain may take several weeks or longer to get better. Follow these instructions at home: Managing pain, stiffness, and swelling   If directed, put ice on the painful area: ? Put ice in a plastic bag. ? Place a towel between your skin and the bag. ? Leave the ice on for 20 minutes, 2-3 times per day. Activity  Rest as told by your health care provider.  Avoid activities that cause pain. These include any activities that use your chest muscles or your abdominal and side muscles to lift heavy items. Ask your health care provider what activities are safe for you. General instructions   Take over-the-counter and prescription medicines only as told by your health care provider.  Do not use any products that contain nicotine or tobacco, such as cigarettes, e-cigarettes, and chewing tobacco. These can delay healing after injury. If you need help quitting, ask your health care provider.  Keep all follow-up visits as told by your health care provider. This is important. Contact a health care provider if:  You have a fever.  Your chest pain becomes worse.  You have new symptoms. Get help right away if:  You have nausea or vomiting.  You feel sweaty or light-headed.  You have a cough with mucus from your lungs (sputum) or you cough up blood.  You develop shortness of breath. These symptoms may represent a serious problem that is an emergency. Do not wait to see if the symptoms will go away. Get medical help right away. Call your  local emergency services (911 in the [place] Do not drive yourself to the hospital. Summary  Chest wall pain is pain in or around the bones and muscles of your chest.  Depending on the cause, it may be treated with ice, rest, medicines, and avoiding activities that cause pain.  Contact a health care provider if you have a fever, worsening chest pain, or new symptoms.  Get help right away if you feel light-headed or you develop shortness of breath. These symptoms may be an emergency. This information is not intended to replace advice given to you by your health care provider. Make sure you discuss any questions you have with your health care provider. Document Revised: 03/16/2018 Document Reviewed: 03/16/2018 Elsevier Patient Education  2020 [employer].

## 2020-10-01 NOTE — Progress Notes (Signed)
Assessment and Plan:  Alex Lewis was seen today for acute visit.  Diagnoses and all orders for this visit:  Right-sided chest wall pain Tender lower R sided ribs, improving spontaneously, abdominal exam is benign, improving with rest, bracing, ? Heat Exam suggestive of MSK etiology, possible muscle strain/spasm or element of costochondritis  Continue with rest and monitoring, can try ice/heat, tylenol, topical voltaren/aspercreme Do not feel he needs labs or imaging at this time considering exam and resolving sx He is in agreement; wife will help monitor Can take miralax with water or prune juice to soften stools and avoid straining Follow up if any worsening sx or not resolving over next few days as expected.   Further disposition pending results of labs. Discussed med's effects and SE's.   Over 15 minutes of exam, counseling, chart review, and critical decision making was performed.   Future Appointments  Date Time Provider Department Center  11/07/2020 10:00 AM Judd Gaudier, NP [facility] None  02/06/2021 10:30 AM [name], MD [facility] None  08/14/2021 10:00 AM [name], MD [facility] None    ------------------------------------------------------------------------------------------------------------------   HPI BP 136/72   Pulse 63   Temp (!) 97 F (36.1 C)   Wt 259 lb (117.5 kg)   SpO2 97%   BMI 37.16 kg/m   70 y.o.male presents for RUQ abdominal/ R lower chest wall pain that began yesterday evening. He reports went out to dinner (hamburgers), drove home and got out of car as he twisted, had sudden onset of sharp pain, any movement would make it hurt, had some nausea, hurt to bend over, or twisting, felt better with bracing (worse a band/belt last night), 8-9/10 last night. Reports was constant pain, non-radiating. Reports seems to be improving today, 5/10, now intermittent. Hurts with bearing down, coughing, laughing, brisk twisting.   Has not taken anything  for pain. Changing positions no longer causes pain. Did try applying heat yesterday evening and wonders if this helps.   Denies nausea today, had BM yesterday, hasn't had BM today (abmits doesn't want to strain as this is uncomfortable). Denies urinary changes. Denies back/flank pain. Denies stool changes. Denies dyspnea, cough, fever/chills, back pain, flank pain, rash, urinary changes.   2009 colonoscopy showed distal colon/sigmoid diverticulosis, denies ever having flare  Does have hx of kidney stones, feels this is not typical   Past Medical History:  Diagnosis Date  . Allergic rhinitis   . BPH (benign prostatic hyperplasia)   . GERD (gastroesophageal reflux disease)   . Hyperlipidemia   . Hypertension   . Hypogonadism, male   . Kidney stones   . Morbid obesity (HCC)   . Pre-diabetes   . Vitamin D deficiency      Allergies  Allergen Reactions  . Penicillins     REACTION: unknown    Current Outpatient Medications on File Prior to Visit  Medication Sig  . B Complex-C (SUPER B COMPLEX PO) Take 1 tablet by mouth daily.  . bisoprolol-hydrochlorothiazide (ZIAC) 5-6.25 MG tablet TAKE 1 TABLET DAILY FOR BLOOD PRESSURE  . Calcium Carbonate Antacid (TUMS PO) Take by mouth.  . Cholecalciferol (VITAMIN D-3) 5000 UNITS TABS Take 10,000 Units by mouth daily.  . fluticasone (FLONASE) 50 MCG/ACT nasal spray SPRAY 2 SPRAYS INTO EACH NOSTRIL EVERY DAY  . loratadine (CLARITIN) 10 MG tablet Take 10 mg by mouth daily.  . montelukast (SINGULAIR) 10 MG tablet TAKE 1 TABLET BY MOUTH DAILY FOR ALLERGIES  . XARELTO 20 MG TABS tablet TAKE 1 TABLET DAILY  TO PREVENT BLOOD CLOTS  . Zinc 50 MG TABS Take by mouth daily.   No current facility-administered medications on file prior to visit.    ROS: all negative except above.   Physical Exam:  BP 136/72   Pulse 63   Temp (!) 97 F (36.1 C)   Wt 259 lb (117.5 kg)   SpO2 97%   BMI 37.16 kg/m   General Appearance: Well nourished, morbidly  obese elder male, in no apparent distress.  Eyes: conjunctiva no swelling or erythema ENT/Mouth: Mask in place; Hearing normal.  Neck: Supple Respiratory: Respiratory effort and expansion normal, BS equal bilaterally without rales, rhonchi, wheezing or stridor. Tachea midline.  Cardio: RRR with no MRGs. Brisk peripheral pulses without edema.  Abdomen: Soft, morbidly obese abdomen, non-distended + BS.  NOT notably tender, no guarding, rebound, masses. Does have non-enter umbilical hernia. No CVA tenderness. Neg murphy's.  Lymphatics: Non tender without lymphadenopathy.  Musculoskeletal: slow steady gait, very tender over lateral inferior ribs - without palpable bony abnormality, no palpable spasm Skin: Warm, dry without rashes, lesions, ecchymosis.  Neuro: Normal muscle tone,  Psych: Awake and oriented X 3, normal affect, Insight and Judgment appropriate.     Dan Maker, NP 5:36 PM [facility]

## 2020-10-04 ENCOUNTER — Other Ambulatory Visit: Payer: Self-pay | Admitting: Internal Medicine

## 2020-10-04 MED ORDER — DEXAMETHASONE 4 MG PO TABS: ORAL_TABLET | ORAL | 0 refills | Status: DC

## 2020-10-04 MED ORDER — MELOXICAM 15 MG PO TABS: ORAL_TABLET | ORAL | 0 refills | Status: DC

## 2020-10-04 MED ORDER — GABAPENTIN 300 MG PO CAPS: ORAL_CAPSULE | ORAL | 0 refills | Status: DC

## 2020-10-23 ENCOUNTER — Ambulatory Visit (INDEPENDENT_AMBULATORY_CARE_PROVIDER_SITE_OTHER): Payer: BC Managed Care – PPO | Admitting: Internal Medicine

## 2020-10-23 ENCOUNTER — Other Ambulatory Visit: Payer: Self-pay

## 2020-10-23 VITALS — BP 132/78 | HR 70 | Temp 97.2°F | Resp 16 | Ht 70.0 in | Wt 267.8 lb

## 2020-10-23 DIAGNOSIS — S39011A Strain of muscle, fascia and tendon of abdomen, initial encounter: Secondary | ICD-10-CM | POA: Diagnosis not present

## 2020-10-25 ENCOUNTER — Encounter: Payer: Self-pay | Admitting: Internal Medicine

## 2020-10-25 NOTE — Progress Notes (Signed)
   History of Present Illness:      Patient is a very nice 70 yo MWM who present with concerns of possible abdominal wall hernia after lifting heavy furniture about 10-11 days ago. He describes mid abdominal  That seems to radiate to the Rt lateral abdomen. He denies any N/V or change in bowel pattern.   Medications  .  bisoprolol-hctz 5-6.25 MG tablet, TAKE 1 TABLET DAILY .  FLONASE  nasal spray, 2 SPRAYS INTO EACH NOSTRIL EVERY DAY .  loratadine  10 MG , Take  daily. .  montelukast  10 MG, TAKE 1 TABLET  DAILY .  TYLENOL 8 HOUR 650 MG CR , Take  every 8  hours as needed  .  meloxicam 15 MG tablet, Take 1 tablet Daily  .  XARELTO 20 MG , TAKE 1 TABLET DAILY  .  SUPER B COMPLEX, Take 1 tablet by mouth daily. Leslie Dales , Take daily .  VITAMIN D 5000 UNITS TABS, Take 10,000 Units by mouth daily. Marland Kitchen  gabapentin 300 MG , Take 1 capsulex/day      for Pain .  Zinc 50 MG TABS, Take by mouth daily.  Problem list He has Essential hypertension; Hyperlipidemia; Abnormal glucose; Vitamin D deficiency; Medication management; GERD ; Depression, major, recurrent, in partial remission (HCC); Morbid obesity (HCC); and Deep vein thrombosis (DVT) of proximal vein of right lower extremity (HCC) on their problem list.   Observations/Objective:   BP 132/78   Pulse 70   Temp (!) 97.2 F (36.2 C)   Resp 16   Ht 5\' 10"  (1.778 m)   Wt 267 lb 12.8 oz (121.5 kg)   BMI 38.43 kg/m   HEENT - WNL. Neck - supple.  Chest - Clear equal BS. Cor - Nl HS. RRR w/o sig MGR. PP 1(+). No edema. Abd- Soft. Rotund. No tenderness, guarding or RB & BS are Nl. There is a smaa reducible umbilical hernia,which patient relateds is non tender and has been present for years.  MS- FROM w/o deformities.  Gait Nl. Neuro -  Nl w/o focal abnormalities.  Assessment and Plan:   1. Abdominal muscle strain, initial encounter  - Patient reassured and given symptoms to present to ER for.   Follow Up Instructions:        I  discussed the assessment and treatment plan with the patient. The patient was provided an opportunity to ask questions and all were answered. The patient agreed with the plan and demonstrated an understanding of the instructions.       The patient was advised to call back or seek an in-person evaluation if the symptoms worsen or if the condition fails to improve as anticipated.   , MD

## 2020-11-07 ENCOUNTER — Encounter: Payer: Self-pay | Admitting: Adult Health

## 2020-11-07 ENCOUNTER — Ambulatory Visit: Payer: BC Managed Care – PPO | Admitting: Adult Health

## 2020-11-07 ENCOUNTER — Other Ambulatory Visit: Payer: Self-pay

## 2020-11-07 VITALS — BP 140/80 | HR 60 | Temp 97.9°F | Ht 70.0 in | Wt 261.0 lb

## 2020-11-07 DIAGNOSIS — E782 Mixed hyperlipidemia: Secondary | ICD-10-CM | POA: Diagnosis not present

## 2020-11-07 DIAGNOSIS — Z79899 Other long term (current) drug therapy: Secondary | ICD-10-CM

## 2020-11-07 DIAGNOSIS — E559 Vitamin D deficiency, unspecified: Secondary | ICD-10-CM | POA: Diagnosis not present

## 2020-11-07 DIAGNOSIS — F3341 Major depressive disorder, recurrent, in partial remission: Secondary | ICD-10-CM

## 2020-11-07 DIAGNOSIS — I82401 Acute embolism and thrombosis of unspecified deep veins of right lower extremity: Secondary | ICD-10-CM

## 2020-11-07 DIAGNOSIS — I10 Essential (primary) hypertension: Secondary | ICD-10-CM

## 2020-11-07 DIAGNOSIS — I824Y1 Acute embolism and thrombosis of unspecified deep veins of right proximal lower extremity: Secondary | ICD-10-CM

## 2020-11-07 DIAGNOSIS — R7309 Other abnormal glucose: Secondary | ICD-10-CM

## 2020-11-07 MED ORDER — RIVAROXABAN 10 MG PO TABS: 10.0000 mg | ORAL_TABLET | Freq: Every day | ORAL | 3 refills | Status: DC

## 2020-11-07 NOTE — Progress Notes (Signed)
FOLLOW UP  Assessment and Plan:   Hypertension Fairly controlled with current medications  Monitor blood pressure at home; patient to call if consistently greater than 130/80 Continue DASH diet.   Reminder to go to the ER if any CP, SOB, nausea, dizziness, severe HA, changes vision/speech, left arm numbness and tingling and jaw pain.  Cholesterol Controlled by lifestyle modification Continue low cholesterol diet and exercise.  Check lipid panel.   Prediabetes Discussed disease progression and risks Discussed diet/exercise, weight management and risk modification Check A1C q90m; defer today; checking CMP  Morbid obesity with co morbidities Long discussion about weight loss, diet, and exercise He has worked to get his weight back down, he will continue to eat more at home and cutting back on carbs/portions Discussed benefits of intermittent fasting and how to incorporate Discussed possible benefit with semaglutide; information given; he will hold off for now but consider Will follow up in 3 months  Vitamin D Def/ osteoporosis prevention At goal at recent check; continue to recommend supplementation for goal of 70-100 Defer vitamin D level  Depression/anxiety Doing well off of medications Lifestyle discussed: diet/exerise, sleep hygiene, stress management, hydration  Recurrent DVT of RLE Has completed 6+ months of 20 mg; discussed reduction to DVT prophylaxis dose, 10 mg daily. No concerns with bleeding at this time. Reminded to present to ED for any falls with head injury.    Continue diet and meds as discussed. Further disposition pending results of labs. Discussed med's effects and SE's.   Over 30 minutes of exam, counseling, chart review, and critical decision making was performed.   Future Appointments  Date Time Provider Department Center  02/06/2021 10:30 AM [name], MD [facility] None  08/14/2021 10:00 AM [name], MD [facility] None     ----------------------------------------------------------------------------------------------------------------------  HPI 70 y.o. male  presents for 3 month follow up on hypertension, cholesterol, prediabetes, obesity, depression, GERD and vitamin D deficiency.   Patient was treated for a RLE DVT in June 2020 on Xarelto x 3 months, had immedicate reoccurrence 07/12/2019, wearing compression hose daily. Doing well on xarelto at this time.    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   BMI is Body mass index is 37.45 kg/m., he has been working on diet and exercise, was doing elliptical daily but had stopped due to a fall and chest wall pain, feeling better, plans to restart. Eating out/take out less and feels this is really helping.  Initial goal <230 lb, goal for this year <200lb.  Wt Readings from Last 3 Encounters:  11/07/20 261 lb (118.4 kg)  10/23/20 267 lb 12.8 oz (121.5 kg)  10/01/20 259 lb (117.5 kg)   His blood pressure has been controlled at home, today their BP is BP: 140/80  He does not workout but is active outside. He denies chest pain, shortness of breath, dizziness.   He is not on cholesterol medication and denies myalgias. His cholesterol is at goal. The cholesterol last visit was:   Lab Results  Component Value Date   CHOL 163 08/01/2020   HDL 44 08/01/2020   LDLCALC 100 (H) 08/01/2020   TRIG 98 08/01/2020   CHOLHDL 3.7 08/01/2020    He has been working on diet for prediabetes, and denies increased appetite, nausea, paresthesia of the feet, polydipsia, polyuria, visual disturbances and vomiting. Last A1C in the office was:  Lab Results  Component Value Date   HGBA1C 6.1 (H) 08/01/2020    He has CKD  IIIa monitored at this office:  Lab Results  Component Value Date   GFRNONAA 92 (L) 08/01/2020   Patient is on Vitamin D supplement and at goal:   Lab Results  Component Value Date   VD25OH 86 08/01/2020       Current Medications:   Current Outpatient Medications on File Prior to Visit  Medication Sig  . B Complex-C (SUPER B COMPLEX PO) Take 1 tablet by mouth daily.  . bisoprolol-hydrochlorothiazide (ZIAC) 5-6.25 MG tablet TAKE 1 TABLET DAILY FOR BLOOD PRESSURE  . Calcium Carbonate Antacid (TUMS PO) Take by mouth.  . Cholecalciferol (VITAMIN D-3) 5000 UNITS TABS Take 10,000 Units by mouth daily.  . fluticasone (FLONASE) 50 MCG/ACT nasal spray SPRAY 2 SPRAYS INTO EACH NOSTRIL EVERY DAY  . loratadine (CLARITIN) 10 MG tablet Take 10 mg by mouth daily.  . montelukast (SINGULAIR) 10 MG tablet TAKE 1 TABLET BY MOUTH DAILY FOR ALLERGIES  . XARELTO 20 MG TABS tablet TAKE 1 TABLET DAILY TO PREVENT BLOOD CLOTS  . Zinc 50 MG TABS Take by mouth daily.  Marland Kitchen acetaminophen (TYLENOL) 650 MG CR tablet Take 650 mg by mouth every 8 (eight) hours as needed for pain. (Patient not taking: Reported on 11/07/2020)   No current facility-administered medications on file prior to visit.     Allergies:  Allergies  Allergen Reactions  . Penicillins     REACTION: unknown     Medical History:  Past Medical History:  Diagnosis Date  . Allergic rhinitis   . BPH (benign prostatic hyperplasia)   . GERD (gastroesophageal reflux disease)   . Hyperlipidemia   . Hypertension   . Hypogonadism, male   . Kidney stones   . Morbid obesity (HCC)   . Pre-diabetes   . Vitamin D deficiency    Family history- Reviewed and unchanged Social history- Reviewed and unchanged   Review of Systems:  Review of Systems  Constitutional: Negative for malaise/fatigue and weight loss.  HENT: Negative for hearing loss and tinnitus.   Eyes: Negative for blurred vision and double vision.  Respiratory: Negative for cough, shortness of breath and wheezing.   Cardiovascular: Negative for chest pain, palpitations, orthopnea, claudication and leg swelling.  Gastrointestinal: Negative for abdominal pain, blood in stool, constipation, diarrhea, heartburn, melena,  nausea and vomiting.  Genitourinary: Negative.   Musculoskeletal: Negative for joint pain and myalgias.  Skin: Negative for rash.  Neurological: Negative for dizziness, tingling, sensory change, weakness and headaches.  Endo/Heme/Allergies: Negative for polydipsia.  Psychiatric/Behavioral: Negative.   All other systems reviewed and are negative.   Physical Exam: BP 140/80   Pulse 60   Temp 97.9 F (36.6 C)   Ht 5\' 10"  (1.778 m)   Wt 261 lb (118.4 kg)   SpO2 94%   BMI 37.45 kg/m  Wt Readings from Last 3 Encounters:  11/07/20 261 lb (118.4 kg)  10/23/20 267 lb 12.8 oz (121.5 kg)  10/01/20 259 lb (117.5 kg)   General Appearance: Well nourished, in no apparent distress. Eyes: PERRLA, EOMs, conjunctiva no swelling or erythema Sinuses: No Frontal/maxillary tenderness ENT/Mouth: Ext aud canals clear, TMs without erythema, bulging. Mask in place; oral exam deferred. Hearing normal.  Neck: Supple, thyroid normal.  Respiratory: Respiratory effort normal, BS equal bilaterally without rales, rhonchi, wheezing or stridor.  Cardio: RRR with no MRGs. Brisk peripheral pulses, scant pitting edema (not wearing compression hose at time of exam) Abdomen: Soft, obese abdomen, + BS.  Non tender, no guarding, rebound, hernias, masses. Lymphatics: Non  tender without lymphadenopathy.  Musculoskeletal: Full ROM, 5/5 strength, Normal gait Skin: Warm, dry without rashes, lesions, ecchymosis.  Neuro: Cranial nerves intact. No cerebellar symptoms.  Psych: Awake and oriented X 3, normal affect, Insight and Judgment appropriate.    Dan Maker, NP 10:31 AM [facility]

## 2020-11-07 NOTE — Patient Instructions (Signed)
Goals    . Exercise 150 min/wk Moderate Activity    . Weight (lb) < 245 lb (111.1 kg)      Ozempic or wegovy (different pen but same medication)  Once weekly auto injector  Very effective for weight loss, also for blood sugar and reduces stroke, heart attack risks significantly 25+%   Intermittent fasting can be very beneficial for fat loss; a common way to start is 8:16 - 8 hour eating window, then 16 hours of fasting (only water, unsweet tea or black coffee)  Can go longer if doing well - 4:20  Extended periods of fasting encourage fat burning, help with autophagy (body killing dangerous cells - helps reduce cancer risk)  Avoid processed carbohydrates as much as possible which stimulate insulin response and fat storage  Exercise helps with insulin sensitivity - 30 min brisk activity after a meal   Semaglutide injection solution What is this medicine? SEMAGLUTIDE (Sem a GLOO tide) is used to improve blood sugar control in adults with type 2 diabetes. This medicine may be used with other diabetes medicines. This drug may also reduce the risk of heart attack or stroke if you have type 2 diabetes and risk factors for heart disease. This medicine may be used for other purposes; ask your health care provider or pharmacist if you have questions. COMMON BRAND NAME(S): OZEMPIC What should I tell my health care provider before I take this medicine? They need to know if you have any of these conditions:  endocrine tumors (MEN 2) or if someone in your family had these tumors  eye disease, vision problems  history of pancreatitis  kidney disease  stomach problems  thyroid cancer or if someone in your family had thyroid cancer  an unusual or allergic reaction to semaglutide, other medicines, foods, dyes, or preservatives  pregnant or trying to get pregnant  breast-feeding How should I use this medicine? This medicine is for injection under the skin of your upper leg (thigh),  stomach area, or upper arm. It is given once every week (every 7 days). You will be taught how to prepare and give this medicine. Use exactly as directed. Take your medicine at regular intervals. Do not take it more often than directed. If you use this medicine with insulin, you should inject this medicine and the insulin separately. Do not mix them together. Do not give the injections right next to each other. Change (rotate) injection sites with each injection. It is important that you put your used needles and syringes in a special sharps container. Do not put them in a trash can. If you do not have a sharps container, call your pharmacist or healthcare provider to get one. A special MedGuide will be given to you by the pharmacist with each prescription and refill. Be sure to read this information carefully each time. This drug comes with INSTRUCTIONS FOR USE. Ask your pharmacist for directions on how to use this drug. Read the information carefully. Talk to your pharmacist or health care provider if you have questions. Talk to your pediatrician regarding the use of this medicine in children. Special care may be needed. Overdosage: If you think you have taken too much of this medicine contact a poison control center or emergency room at once. NOTE: This medicine is only for you. Do not share this medicine with others. What if I miss a dose? If you miss a dose, take it as soon as you can within 5 days after the missed dose.  Then take your next dose at your regular weekly time. If it has been longer than 5 days after the missed dose, do not take the missed dose. Take the next dose at your regular time. Do not take double or extra doses. If you have questions about a missed dose, contact your health care provider for advice. What may interact with this medicine?  other medicines for diabetes Many medications may cause changes in blood sugar, these include:  alcohol containing beverages  antiviral  medicines for HIV or AIDS  aspirin and aspirin-like drugs  certain medicines for blood pressure, heart disease, irregular heart beat  chromium  diuretics  male hormones, such as estrogens or progestins, birth control pills  fenofibrate  gemfibrozil  isoniazid  lanreotide  male hormones or anabolic steroids  MAOIs like Carbex, Eldepryl, Marplan, Nardil, and Parnate  medicines for weight loss  medicines for allergies, asthma, cold, or cough  medicines for depression, anxiety, or psychotic disturbances  niacin  nicotine  NSAIDs, medicines for pain and inflammation, like ibuprofen or naproxen  octreotide  pasireotide  pentamidine  phenytoin  probenecid  quinolone antibiotics such as ciprofloxacin, levofloxacin, ofloxacin  some herbal dietary supplements  steroid medicines such as prednisone or cortisone  sulfamethoxazole; trimethoprim  thyroid hormones Some medications can hide the warning symptoms of low blood sugar (hypoglycemia). You may need to monitor your blood sugar more closely if you are taking one of these medications. These include:  beta-blockers, often used for high blood pressure or heart problems (examples include atenolol, metoprolol, propranolol)  clonidine  guanethidine  reserpine This list may not describe all possible interactions. Give your health care provider a list of all the medicines, herbs, non-prescription drugs, or dietary supplements you use. Also tell them if you smoke, drink alcohol, or use illegal drugs. Some items may interact with your medicine. What should I watch for while using this medicine? Visit your doctor or health care professional for regular checks on your progress. Drink plenty of fluids while taking this medicine. Check with your doctor or health care professional if you get an attack of severe diarrhea, nausea, and vomiting. The loss of too much body fluid can make it dangerous for you to take this  medicine. A test called the HbA1C (A1C) will be monitored. This is a simple blood test. It measures your blood sugar control over the last 2 to 3 months. You will receive this test every 3 to 6 months. Learn how to check your blood sugar. Learn the symptoms of low and high blood sugar and how to manage them. Always carry a quick-source of sugar with you in case you have symptoms of low blood sugar. Examples include hard sugar candy or glucose tablets. Make sure others know that you can choke if you eat or drink when you develop serious symptoms of low blood sugar, such as seizures or unconsciousness. They must get medical help at once. Tell your doctor or health care professional if you have high blood sugar. You might need to change the dose of your medicine. If you are sick or exercising more than usual, you might need to change the dose of your medicine. Do not skip meals. Ask your doctor or health care professional if you should avoid alcohol. Many nonprescription cough and cold products contain sugar or alcohol. These can affect blood sugar. Pens should never be shared. Even if the needle is changed, sharing may result in passing of viruses like hepatitis or HIV. Wear a  medical ID bracelet or chain, and carry a card that describes your disease and details of your medicine and dosage times. Do not become pregnant while taking this medicine. Women should inform their doctor if they wish to become pregnant or think they might be pregnant. There is a potential for serious side effects to an unborn child. Talk to your health care professional or pharmacist for more information. What side effects may I notice from receiving this medicine? Side effects that you should report to your doctor or health care professional as soon as possible:  allergic reactions like skin rash, itching or hives, swelling of the face, lips, or tongue  breathing problems  changes in vision  diarrhea that continues or is  severe  lump or swelling on the neck  severe nausea  signs and symptoms of infection like fever or chills; cough; sore throat; pain or trouble passing urine  signs and symptoms of low blood sugar such as feeling anxious, confusion, dizziness, increased hunger, unusually weak or tired, sweating, shakiness, cold, irritable, headache, blurred vision, fast heartbeat, loss of consciousness  signs and symptoms of kidney injury like trouble passing urine or change in the amount of urine  trouble swallowing  unusual stomach upset or pain  vomiting Side effects that usually do not require medical attention (report to your doctor or health care professional if they continue or are bothersome):  constipation  diarrhea  nausea  pain, redness, or irritation at site where injected  stomach upset This list may not describe all possible side effects. Call your doctor for medical advice about side effects. You may report side effects to FDA at 1-800-FDA-1088. Where should I keep my medicine? Keep out of the reach of children. Store unopened pens in a refrigerator between 2 and 8 degrees C (36 and 46 degrees F). Do not freeze. Protect from light and heat. After you first use the pen, it can be stored for 56 days at room temperature between 15 and 30 degrees C (59 and 86 degrees F) or in a refrigerator. Throw away your used pen after 56 days or after the expiration date, whichever comes first. Do not store your pen with the needle attached. If the needle is left on, medicine may leak from the pen. NOTE: This sheet is a summary. It may not cover all possible information. If you have questions about this medicine, talk to your doctor, pharmacist, or health care provider.  2021 [employer] Standard (2019-05-29 09:41:51)

## 2020-11-08 LAB — CBC WITH DIFFERENTIAL/PLATELET
Absolute Monocytes: 672 cells/uL (ref 200–950)
Basophils Absolute: 32 cells/uL (ref 0–200)
Basophils Relative: 0.5 %
Eosinophils Absolute: 218 cells/uL (ref 15–500)
Eosinophils Relative: 3.4 %
HCT: 44.8 % (ref 38.5–50.0)
Hemoglobin: 15.6 g/dL (ref 13.2–17.1)
Lymphs Abs: 1773 cells/uL (ref 850–3900)
MCH: 32.4 pg (ref 27.0–33.0)
MCHC: 34.8 g/dL (ref 32.0–36.0)
MCV: 92.9 fL (ref 80.0–100.0)
MPV: 10.3 fL (ref 7.5–12.5)
Monocytes Relative: 10.5 %
Neutro Abs: 3706 cells/uL (ref 1500–7800)
Neutrophils Relative %: 57.9 %
Platelets: 216 10*3/uL (ref 140–400)
RBC: 4.82 10*6/uL (ref 4.20–5.80)
RDW: 12.7 % (ref 11.0–15.0)
Total Lymphocyte: 27.7 %
WBC: 6.4 10*3/uL (ref 3.8–10.8)

## 2020-11-08 LAB — COMPLETE METABOLIC PANEL WITH GFR
AG Ratio: 1.9 (calc) (ref 1.0–2.5)
ALT: 19 U/L (ref 9–46)
AST: 17 U/L (ref 10–35)
Albumin: 4.3 g/dL (ref 3.6–5.1)
Alkaline phosphatase (APISO): 53 U/L (ref 35–144)
BUN/Creatinine Ratio: 19 (calc) (ref 6–22)
BUN: 26 mg/dL — ABNORMAL HIGH (ref 7–25)
CO2: 27 mmol/L (ref 20–32)
Calcium: 10.2 mg/dL (ref 8.6–10.3)
Chloride: 105 mmol/L (ref 98–110)
Creat: 1.35 mg/dL — ABNORMAL HIGH (ref 0.70–1.25)
GFR, Est African American: 62 mL/min/{1.73_m2} (ref >=60)
GFR, Est Non African American: 53 mL/min/{1.73_m2} — ABNORMAL LOW (ref >=60)
Globulin: 2.3 g/dL (calc) (ref 1.9–3.7)
Glucose, Bld: 120 mg/dL — ABNORMAL HIGH (ref 65–99)
Potassium: 4.5 mmol/L (ref 3.5–5.3)
Sodium: 140 mmol/L (ref 135–146)
Total Bilirubin: 0.7 mg/dL (ref 0.2–1.2)
Total Protein: 6.6 g/dL (ref 6.1–8.1)

## 2020-11-08 LAB — MAGNESIUM: Magnesium: 1.9 mg/dL (ref 1.5–2.5)

## 2020-11-08 LAB — LIPID PANEL
Cholesterol: 168 mg/dL (ref <200)
HDL: 47 mg/dL (ref >=40)
LDL Cholesterol (Calc): 104 mg/dL (calc) — ABNORMAL HIGH
Non-HDL Cholesterol (Calc): 121 mg/dL (calc) (ref <130)
Total CHOL/HDL Ratio: 3.6 (calc) (ref <5.0)
Triglycerides: 82 mg/dL (ref <150)

## 2020-11-08 LAB — TSH: TSH: 2.04 mIU/L (ref 0.40–4.50)

## 2021-02-05 ENCOUNTER — Encounter: Payer: Self-pay | Admitting: Internal Medicine

## 2021-02-05 NOTE — Progress Notes (Signed)
Future Appointments  Date Time Provider Department Center  02/06/2021 10:30 AM [name], MD [facility] None  08/14/2021 - CPE  10:00 AM [name], MD [facility] None    History of Present Illness:       This very nice 70 y.o. MWM presents for 6 month follow up with HTN, HLD, Pre-Diabetes and Vitamin D Deficiency.        In June 2020, patient was treated for a RLE DVT tx'd with Xarelto for 3 months   and then in Oct 2020,  he had  a recurrent DVT and was restarted on Xarelto       Patient is treated for HTN (2001) & patient has CKD3a (GFR 56).  Patient's  BP has been controlled at home. Today's BP is at goal -124/82. Patient has had no complaints of any cardiac type chest pain, palpitations, dyspnea / orthopnea / PND, dizziness, claudication, or dependent edema.       Hyperlipidemia is controlled with diet & meds. Patient denies myalgias or other med SE's. Last Lipids were near goal:   Lab Results  Component Value Date   CHOL 168 11/07/2020   HDL 47 11/07/2020   LDLCALC 104 (H) 11/07/2020   TRIG 82 11/07/2020   CHOLHDL 3.6 11/07/2020     Also, the patient has Morbid Obesity (35+)  & consequent PreDiabetes(A1c 6.2% /2011) and has had no symptoms of reactive hypoglycemia, diabetic polys, paresthesias or visual blurring.  Last A1c was not at goal:  Lab Results  Component Value Date   HGBA1C 6.1 (H) 08/01/2020         Further, the patient also has history of Vitamin D Deficiency ("16" /2008)and supplements vitamin D without any suspected side-effects. Last vitamin D was at goal:  Lab Results  Component Value Date   VD25OH 86 08/01/2020    Current Outpatient Medications on File Prior to Visit  Medication Sig  . acetaminophen 650 MG CR tablet Take 650 mg every 8 (hours as needed for pain.  . SUPER B COMPLEX Take 1 tablet daily.  . bisoprolol-hctz  5-6.25 MG tablet TAKE 1 TABLET DAILY   . TUMS  Take daily  . VITAMIN D 5000 u Take 10,000 Units daily.   Marland Kitchen FLONASE nasal spray 2 SPRAYS INTO EACH NOSTRIL EVERY DAY  . loratadine 10 MG tablet 1 tablet Daily   . montelukast 10 MG tablet 1 tablet daily   . rivaroxaban (XARELTO) 10 MG TABS tablet Take 1 tablet  daily with supper.  . Zinc 50 MG TABS Take  daily.    Allergies  Allergen Reactions  . Penicillins REACTION: unknown    PMHx:   Past Medical History:  Diagnosis Date  . Allergic rhinitis   . BPH (benign prostatic hyperplasia)   . GERD (gastroesophageal reflux disease)   . Hyperlipidemia   . Hypertension   . Hypogonadism, male   . Kidney stones   . Morbid obesity (HCC)   . Pre-diabetes   . Vitamin D deficiency     Immunization History  Administered Date(s) Administered  . DTaP 01/26/2000  . Influenza Whole 06/22/2013  . Influenza, High Dose Seasonal PF 06/27/2015, 06/27/2017, 06/20/2018, 05/13/2019, 06/20/2020  . Influenza-Unspecified 07/13/2014, 06/26/2016, 05/13/2019  . PFIZER Comirnaty(Gray Top)Covid-19 Tri-Sucrose Vaccine 01/24/2021  . PFIZER(Purple Top)SARS-COV-2 Vaccination 11/03/2019, 11/24/2019, 06/20/2020  . PPD Test 05/29/2014, 07/30/2015  . Pneumococcal Conjugate-13 05/13/2017  . Pneumococcal Polysaccharide-23 06/20/2018  . Tdap 09/06/2014  . Zoster 04/22/2013  Past Surgical History:  Procedure Laterality Date  . FLEXIBLE SIGMOIDOSCOPY  1992  . KNEE SURGERY Right 1977  . TONSILLECTOMY AND ADENOIDECTOMY  1960    FHx:    Reviewed / unchanged  SHx:    Reviewed / unchanged   Systems Review:  Constitutional: Denies fever, chills, wt changes, headaches, insomnia, fatigue, night sweats, change in appetite. Eyes: Denies redness, blurred vision, diplopia, discharge, itchy, watery eyes.  ENT: Denies discharge, congestion, post nasal drip, epistaxis, sore throat, earache, hearing loss, dental pain, tinnitus, vertigo, sinus pain, snoring.  CV: Denies chest pain, palpitations, irregular heartbeat, syncope, dyspnea, diaphoresis, orthopnea, PND, claudication  or edema. Respiratory: denies cough, dyspnea, DOE, pleurisy, hoarseness, laryngitis, wheezing.  Gastrointestinal: Denies dysphagia, odynophagia, heartburn, reflux, water brash, abdominal pain or cramps, nausea, vomiting, bloating, diarrhea, constipation, hematemesis, melena, hematochezia  or hemorrhoids. Genitourinary: Denies dysuria, frequency, urgency, nocturia, hesitancy, discharge, hematuria or flank pain. Musculoskeletal: Denies arthralgias, myalgias, stiffness, jt. swelling, pain, limping or strain/sprain.  Skin: Denies pruritus, rash, hives, warts, acne, eczema or change in skin lesion(s). Neuro: No weakness, tremor, incoordination, spasms, paresthesia or pain. Psychiatric: Denies confusion, memory loss or sensory loss. Endo: Denies change in weight, skin or hair change.  Heme/Lymph: No excessive bleeding, bruising or enlarged lymph nodes.  Physical Exam  BP 124/82   Pulse (!) 54   Temp (!) 97.5 F (36.4 C)   Resp 16   Ht 5\' 10"  (1.778 m)   Wt 256 lb 3.2 oz (116.2 kg)   SpO2 99%   BMI 36.76 kg/m   Appears  well nourished, well groomed  and in no distress.  Eyes: PERRLA, EOMs, conjunctiva no swelling or erythema. Sinuses: No frontal/maxillary tenderness ENT/Mouth: EAC's clear, TM's nl w/o erythema, bulging. Nares clear w/o erythema, swelling, exudates. Oropharynx clear without erythema or exudates. Oral hygiene is good. Tongue normal, non obstructing. Hearing intact.  Neck: Supple. Thyroid not palpable. Car 2+/2+ without bruits, nodes or JVD. Chest: Respirations nl with BS clear & equal w/o rales, rhonchi, wheezing or stridor.  Cor: Heart sounds normal w/ regular rate and rhythm without sig. murmurs, gallops, clicks or rubs. Peripheral pulses normal and equal  without edema.  Abdomen: Soft & bowel sounds normal. Non-tender w/o guarding, rebound, hernias, masses or organomegaly.  Lymphatics: Unremarkable.  Musculoskeletal: Full ROM all peripheral extremities, joint stability,  5/5 strength and normal gait.  Skin: Warm, dry without exposed rashes, lesions or ecchymosis apparent.  Neuro: Cranial nerves intact, reflexes equal bilaterally. Sensory-motor testing grossly intact. Tendon reflexes grossly intact.  Pysch: Alert & oriented x 3.  Insight and judgement nl & appropriate. No ideations.  Assessment and Plan:  1. Essential hypertension  - Continue medication, monitor blood pressure at home.  - Continue DASH diet.  Reminder to go to the ER if any CP,  SOB, nausea, dizziness, severe HA, changes vision/speech.  - CBC with Differential/Platelet - COMPLETE METABOLIC PANEL WITH GFR - Magnesium - TSH  2. Hyperlipidemia, mixed  - Continue diet/meds, exercise,& lifestyle modifications.  - Continue monitor periodic cholesterol/liver & renal functions   - Lipid panel - TSH  3. Abnormal glucose  - Continue diet, exercise  - Lifestyle modifications.  - Monitor appropriate labs  - Hemoglobin A1c - Insulin, random  4. Vitamin D deficiency  - Continue supplementation.  - VITAMIN D 25 Hydroxy  5. Class 2 obesity due to excess calories with  body mass index (BMI) of 35.0 to 35.9 in adult  - TSH  6. Medication management  -  CBC with Differential/Platelet - COMPLETE METABOLIC PANEL WITH GFR - Magnesium - Lipid panel - TSH - Hemoglobin A1c - Insulin, random - VITAMIN D 25 Hydroxy         Discussed  regular exercise, BP monitoring, weight control to achieve/maintain BMI less than 25 and discussed med and SE's. Recommended labs to assess and monitor clinical status with further disposition pending results of labs.  I discussed the assessment and treatment plan with the patient. The patient was provided an opportunity to ask questions and all were answered. The patient agreed with the plan and demonstrated an understanding of the instructions.  I provided over 30 minutes of exam, counseling, chart review and  complex critical decision  making.          The patient was advised to call back or seek an in-person evaluation if the symptoms worsen or if the condition fails to improve as anticipated.   Marinus Maw, MD

## 2021-02-05 NOTE — Patient Instructions (Signed)

## 2021-02-06 ENCOUNTER — Other Ambulatory Visit: Payer: Self-pay

## 2021-02-06 ENCOUNTER — Ambulatory Visit: Payer: BC Managed Care – PPO | Admitting: Internal Medicine

## 2021-02-06 VITALS — BP 124/82 | HR 54 | Temp 97.5°F | Resp 16 | Ht 70.0 in | Wt 256.2 lb

## 2021-02-06 DIAGNOSIS — E782 Mixed hyperlipidemia: Secondary | ICD-10-CM

## 2021-02-06 DIAGNOSIS — Z79899 Other long term (current) drug therapy: Secondary | ICD-10-CM

## 2021-02-06 DIAGNOSIS — E559 Vitamin D deficiency, unspecified: Secondary | ICD-10-CM

## 2021-02-06 DIAGNOSIS — I10 Essential (primary) hypertension: Secondary | ICD-10-CM

## 2021-02-06 DIAGNOSIS — E6609 Other obesity due to excess calories: Secondary | ICD-10-CM

## 2021-02-06 DIAGNOSIS — Z6835 Body mass index (BMI) 35.0-35.9, adult: Secondary | ICD-10-CM

## 2021-02-06 DIAGNOSIS — R7309 Other abnormal glucose: Secondary | ICD-10-CM | POA: Diagnosis not present

## 2021-02-07 NOTE — Progress Notes (Signed)
============================================================  -   Test results slightly outside the reference range are not unusual.  If there is anything important, I will review this with you,  otherwise it is considered normal test values.  If you have further questions,  please do not hesitate to contact me at the office or via My Chart.  ============================================================  ============================================================   - Kidney functions (GFR) decreased suggesting mild dehydration from   not drinking enough fluids / water - So it's very Important to drink at least                6 bottles (16 oz) of water or Fluid /day to                               prevent permanent Kidney Damage.   ============================================================  ============================================================   - Total Chol = 159 and LDL Chol = 94  - Both Excellent   - Very low risk for Heart Attack / Stroke  ============================================================  ============================================================   - A1c = 6.1% - Your blood sugar and A1c are STILL elevated.    Being diabetic has a 300% increased risk for heart attack,  stroke, cancer, and alzheimer- type vascular dementia.   It is very important that you work harder with diet by  avoiding all foods that are white except chicken,   fish & calliflower.   - Avoid white rice  (brown & wild rice is OK),   - Avoid white potatoes  (sweet potatoes in moderation is OK),   White bread or wheat bread or anything made out of   white flour like bagels, donuts, rolls, buns, biscuits, cakes,   - pastries, cookies, pizza crust, and pasta (made from  white flour & egg whites)   - vegetarian pasta or spinach or wheat pasta is OK.   - Multigrain breads like Arnold's, Pepperidge Farm or   multigrain  sandwich thins or high fiber breads like   [place] bread or "Dave's Killer" breads that are  4 to 5 grams fiber per slice ! are best.    Diet, exercise and weight loss can reverse and cure  diabetes in the early stages.    - Diet, exercise and weight loss is very important in the   control and prevention of complications of diabetes which  affects every system in your body, ie.   -Brain - dementia/stroke,  - eyes - glaucoma/blindness,  - heart - heart attack/heart failure,  - kidneys - dialysis,  - stomach - gastric paralysis,  - intestines - malabsorption,  - nerves - severe painful neuritis,  - circulation - gangrene & loss of a leg(s)  - and finally . . . . . . . . . . . . . . . . . .    - cancer and Alzheimers.  ============================================================  ============================================================   -  Vitamin D = 80  - Excellent  ============================================================  ============================================================   -  All Else - CBC - Kidneys - Electrolytes - Liver - Magnesium & Thyroid    - all  Normal / OK ============================================================  ============================================================

## 2021-02-08 ENCOUNTER — Encounter: Payer: Self-pay | Admitting: Internal Medicine

## 2021-02-09 LAB — COMPLETE METABOLIC PANEL WITH GFR
AG Ratio: 1.8 (calc) (ref 1.0–2.5)
ALT: 18 U/L (ref 9–46)
AST: 18 U/L (ref 10–35)
Albumin: 4.4 g/dL (ref 3.6–5.1)
Alkaline phosphatase (APISO): 55 U/L (ref 35–144)
BUN/Creatinine Ratio: 14 (calc) (ref 6–22)
BUN: 20 mg/dL (ref 7–25)
CO2: 32 mmol/L (ref 20–32)
Calcium: 10.2 mg/dL (ref 8.6–10.3)
Chloride: 102 mmol/L (ref 98–110)
Creat: 1.48 mg/dL — ABNORMAL HIGH (ref 0.70–1.25)
GFR, Est African American: 55 mL/min/{1.73_m2} — ABNORMAL LOW (ref >=60)
GFR, Est Non African American: 48 mL/min/{1.73_m2} — ABNORMAL LOW (ref >=60)
Globulin: 2.5 g/dL (calc) (ref 1.9–3.7)
Glucose, Bld: 123 mg/dL — ABNORMAL HIGH (ref 65–99)
Potassium: 4.3 mmol/L (ref 3.5–5.3)
Sodium: 141 mmol/L (ref 135–146)
Total Bilirubin: 0.8 mg/dL (ref 0.2–1.2)
Total Protein: 6.9 g/dL (ref 6.1–8.1)

## 2021-02-09 LAB — CBC WITH DIFFERENTIAL/PLATELET
Absolute Monocytes: 621 cells/uL (ref 200–950)
Basophils Absolute: 51 cells/uL (ref 0–200)
Basophils Relative: 0.8 %
Eosinophils Absolute: 128 cells/uL (ref 15–500)
Eosinophils Relative: 2 %
HCT: 46.7 % (ref 38.5–50.0)
Hemoglobin: 15.9 g/dL (ref 13.2–17.1)
Lymphs Abs: 1920 cells/uL (ref 850–3900)
MCH: 31.7 pg (ref 27.0–33.0)
MCHC: 34 g/dL (ref 32.0–36.0)
MCV: 93.2 fL (ref 80.0–100.0)
MPV: 10.6 fL (ref 7.5–12.5)
Monocytes Relative: 9.7 %
Neutro Abs: 3680 cells/uL (ref 1500–7800)
Neutrophils Relative %: 57.5 %
Platelets: 215 10*3/uL (ref 140–400)
RBC: 5.01 10*6/uL (ref 4.20–5.80)
RDW: 11.9 % (ref 11.0–15.0)
Total Lymphocyte: 30 %
WBC: 6.4 10*3/uL (ref 3.8–10.8)

## 2021-02-09 LAB — LIPID PANEL
Cholesterol: 159 mg/dL (ref <200)
HDL: 46 mg/dL (ref >=40)
LDL Cholesterol (Calc): 94 mg/dL (calc)
Non-HDL Cholesterol (Calc): 113 mg/dL (calc) (ref <130)
Total CHOL/HDL Ratio: 3.5 (calc) (ref <5.0)
Triglycerides: 101 mg/dL (ref <150)

## 2021-02-09 LAB — HEMOGLOBIN A1C
Hgb A1c MFr Bld: 6.1 % of total Hgb — ABNORMAL HIGH (ref <5.7)
Mean Plasma Glucose: 128 mg/dL
eAG (mmol/L): 7.1 mmol/L

## 2021-02-09 LAB — MAGNESIUM: Magnesium: 2.1 mg/dL (ref 1.5–2.5)

## 2021-02-09 LAB — INSULIN, RANDOM: Insulin: 17.5 u[IU]/mL

## 2021-02-09 LAB — VITAMIN D 25 HYDROXY (VIT D DEFICIENCY, FRACTURES): Vit D, 25-Hydroxy: 80 ng/mL (ref 30–100)

## 2021-02-09 LAB — TSH: TSH: 1.62 mIU/L (ref 0.40–4.50)

## 2021-05-03 ENCOUNTER — Other Ambulatory Visit: Payer: Self-pay | Admitting: Adult Health

## 2021-05-03 DIAGNOSIS — I10 Essential (primary) hypertension: Secondary | ICD-10-CM

## 2021-05-26 NOTE — Progress Notes (Signed)
FOLLOW UP  Assessment and Plan:  Alex Lewis was seen today for acute visit.  Diagnoses and all orders for this visit:  Essential hypertension -     CBC with Differential/Platelet - - continue medications, DASH diet, exercise and monitor at home. Call if greater than 130/80.    Hyperlipidemia, mixed -     COMPLETE METABOLIC PANEL WITH GFR -     Lipid panel -  Continue medications:  Continue diet and exercise.     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 Continue compression stockings, has been wearing daily    Continue diet and meds as discussed. Further disposition pending results of labs. Discussed med's effects and SE's.   Over 30 minutes of exam, counseling, chart review, and critical decision making was performed.   Future Appointments  Date Time Provider Department Center  08/14/2021 10:00 AM [name], MD [facility] None    ----------------------------------------------------------------------------------------------------------------------  HPI 70 y.o. male  presents for evaluation of edema of legs bilaterally.    Patient was treated for a RLE DVT in June 2020 on Xarelto x 3 months thru Sept and re-presented 10/15 with recurrent erythema , RLE swelling & (+) Rt Homans sign & was restarted on Xarelto, 15 mg BID, today starting 20 mg daily. He reports edema has been persistent since that time, mildly improved with the AM but increases in the evening. He has been elevating extremity, wearing compression hose and trying to get up and walk at sedentary job  though admits not consistent with this. Denies pain, paraesthesias of extremity. Denies HA, vision changes, dizziness, weakness. Denies CP, cough, dyspnea.  Xarelto was stopped about a month ago.  About 10 days ago both legs from knees down became swollen and hurt with movement/walking.  BMI is Body mass index is 36.45 kg/m., he has been working on diet and exercise. Wt Readings from Last 3 Encounters:  05/27/21 254 lb (115.2 kg)  02/06/21 256 lb 3.2 oz (116.2 kg)  11/07/20 261 lb (118.4 kg)    His blood pressure has been controlled at home, today their BP is BP: 120/72 BP Readings from Last 3 Encounters:  05/27/21 120/72  02/06/21 124/82  11/07/20 140/80     He does workout. He denies chest pain, shortness of breath, dizziness.   He is not on cholesterol medication . His cholesterol is not at goal. The cholesterol last visit was:   Lab Results  Component Value Date   CHOL 159 02/06/2021   HDL 46 02/06/2021   LDLCALC 94 02/06/2021   TRIG 101 02/06/2021   CHOLHDL 3.5 02/06/2021    He has been working on diet and exercise for prediabetes, and denies foot ulcerations, hyperglycemia, nausea, and paresthesia of the feet. Last A1C in the office was:  Lab Results  Component Value Date   HGBA1C 6.1 (H) 02/06/2021   Patient is on Vitamin D supplement.   Lab Results  Component Value Date   VD25OH 45 02/06/2021        Current Medications:  Current Outpatient Medications on File Prior to Visit  Medication Sig  .  acetaminophen (TYLENOL) 650 MG CR tablet Take 650 mg by mouth every 8 (eight) hours as needed for pain.  . B Complex-C (SUPER B COMPLEX PO) Take 1 tablet by mouth daily.  . bisoprolol-hydrochlorothiazide (ZIAC) 5-6.25 MG tablet Take  1 tablet  Daily  for BP / Patient knows to take by mouth  . Calcium Carbonate Antacid (TUMS PO) Take by mouth.  . Cholecalciferol (VITAMIN D-3) 5000 UNITS TABS Take 10,000 Units by mouth daily.  . fluticasone (FLONASE) 50 MCG/ACT nasal spray  SPRAY 2 SPRAYS INTO EACH NOSTRIL EVERY DAY  . loratadine (CLARITIN) 10 MG tablet Take 10 mg by mouth daily.  . montelukast (SINGULAIR) 10 MG tablet TAKE 1 TABLET BY MOUTH DAILY FOR ALLERGIES  . Zinc 50 MG TABS Take by mouth daily.  . rivaroxaban (XARELTO) 10 MG TABS tablet Take 1 tablet (10 mg total) by mouth daily with supper. (Patient not taking: Reported on 05/27/2021)   No current facility-administered medications on file prior to visit.     Allergies:  Allergies  Allergen Reactions  . Penicillins     REACTION: unknown     Medical History:  Past Medical History:  Diagnosis Date  . Allergic rhinitis   . BPH (benign prostatic hyperplasia)   . GERD (gastroesophageal reflux disease)   . Hypogonadism, male   . Kidney stones   . Morbid obesity (HCC)   . Pre-diabetes   . Vitamin D deficiency    Family history- Reviewed and unchanged Social history- Reviewed and unchanged   Review of Systems:  Review of Systems  Constitutional:  Negative for chills, fever and weight loss.  HENT:  Negative for congestion and hearing loss.   Eyes:  Negative for blurred vision and double vision.  Respiratory:  Negative for cough and shortness of breath.   Cardiovascular:  Positive for leg swelling (legs bilaterally). Negative for chest pain, palpitations and orthopnea.  Gastrointestinal:  Negative for abdominal pain, constipation, diarrhea, heartburn, nausea and vomiting.  Musculoskeletal:  Positive for joint pain (leg pain with walking). Negative for falls and myalgias.  Skin:  Negative for rash.  Neurological:  Negative for dizziness, tingling, tremors, loss of consciousness and headaches.  Psychiatric/Behavioral:  Negative for depression, memory loss and suicidal ideas.      Physical Exam: BP 120/72   Pulse 64   Temp 97.9 F (36.6 C)   Wt 254 lb (115.2 kg)   SpO2 97%   BMI 36.45 kg/m  Wt Readings from Last 3 Encounters:  05/27/21 254 lb (115.2 kg)  02/06/21 256 lb 3.2 oz (116.2  kg)  11/07/20 261 lb (118.4 kg)   General Appearance: Well nourished, in no apparent distress. Eyes: PERRLA, EOMs, conjunctiva no swelling or erythema Sinuses: No Frontal/maxillary tenderness ENT/Mouth: Ext aud canals clear, TMs without erythema, bulging. No erythema, swelling, or exudate on post pharynx.  Tonsils not swollen or erythematous. Hearing normal.  Neck: Supple, thyroid normal.  Respiratory: Respiratory effort normal, BS equal bilaterally without rales, rhonchi, wheezing or stridor.  Cardio: RRR with no MRGs. Brisk peripheral pulses, feet are warm, toes cool bilaterally, R lower leg with pitting edema 2+ through foot, ankle, calf, neg homan's, no tenderness; R calf 43.5 cm, L 43.5 cm.  Abdomen: Soft, + BS.  Non tender, no guarding, rebound, hernias, masses. Lymphatics: Non tender without lymphadenopathy.  Musculoskeletal: Full ROM, 5/5 strength, Normal gait Skin: Warm, dry without rashes, lesions, ecchymosis.  Neuro: Cranial nerves intact. No cerebellar symptoms.  Psych: Awake and oriented X 3,  normal affect, Insight and Judgment appropriate.    Revonda Humphrey, NP 4:16 PM Alex Lewis

## 2021-05-27 ENCOUNTER — Encounter: Payer: Self-pay | Admitting: Nurse Practitioner

## 2021-05-27 ENCOUNTER — Other Ambulatory Visit: Payer: Self-pay

## 2021-05-27 ENCOUNTER — Ambulatory Visit: Payer: BC Managed Care – PPO | Admitting: Nurse Practitioner

## 2021-05-27 VITALS — BP 120/72 | HR 64 | Temp 97.9°F | Wt 254.0 lb

## 2021-05-27 DIAGNOSIS — I82401 Acute embolism and thrombosis of unspecified deep veins of right lower extremity: Secondary | ICD-10-CM | POA: Diagnosis not present

## 2021-05-27 DIAGNOSIS — Z79899 Other long term (current) drug therapy: Secondary | ICD-10-CM

## 2021-05-27 DIAGNOSIS — E782 Mixed hyperlipidemia: Secondary | ICD-10-CM | POA: Diagnosis not present

## 2021-05-27 DIAGNOSIS — R600 Localized edema: Secondary | ICD-10-CM

## 2021-05-27 DIAGNOSIS — F3341 Major depressive disorder, recurrent, in partial remission: Secondary | ICD-10-CM

## 2021-05-27 DIAGNOSIS — I10 Essential (primary) hypertension: Secondary | ICD-10-CM

## 2021-05-27 NOTE — Patient Instructions (Addendum)
Go to the ER if any chest pain, shortness of breath, nausea, dizziness, severe HA, changes vision/speech   Start Xarelto 10 mg today and get doppler U/S tomorrow will call you with appointment time.   Deep Vein Thrombosis Deep vein thrombosis (DVT) is a condition in which a blood clot forms in a deep vein, such as a vein in the lower leg, thigh, pelvis, or arm. Deep veins are veins in the deep venous system. A clot is blood that has thickened into a gel or solid. This condition is serious and can be life-threatening if the clot travels to the lungs and causes a blockage (pulmonary embolism) in the arteries of the lung. A DVT can also damage veins in the leg. This can lead to long-term, or chronic, venous disease, leg pain, swelling, discoloration, and ulcers or sores (post-thrombotic syndrome). What are the causes? This condition may be caused by: A slowdown of blood flow. Damage to a vein. A condition that causes blood to clot more easily, such as certain blood-clotting disorders. What increases the risk? The following factors may make you more likely to develop this condition: Having obesity. Being older, especially older than age 60. Being inactive (sedentary lifestyle) or not moving around. This may include: Sitting or lying down for longer than 4-6 hours other than to sleep at night. Being in the hospital, having major or lengthy surgery, or having a thin, flexible tube (central line catheter) placed in a large vein. Being pregnant, giving birth, or having recently given birth. Taking medicines that contain estrogen, such as birth control or hormone replacement therapy. Using products that contain nicotine or tobacco, especially if you use hormonal birth control. Having a history of blood clots or a blood-clotting disease, a blood vessel disease (peripheral vascular disease), or congestive heart disease. Having a history of cancer, especially if being treated with chemotherapy. What are  the signs or symptoms? Symptoms of this condition include: Swelling, pain, pressure, or tenderness in an arm or a leg. An arm or a leg becoming warm, red, or discolored. A leg turning very pale. You may have a large DVT. This is rare. If the clot is in your leg, you may notice symptoms more or have worse symptoms when you stand or walk. In some cases, there are no symptoms. How is this diagnosed? This condition is diagnosed with: Your medical history and a physical exam. Tests, such as: Blood tests to check how well your blood clots. Doppler ultrasound. This is the best way to find a DVT. Venogram. Contrast dye is injected into a vein, and X-rays are taken to check for clots. How is this treated? Treatment for this condition depends on: The cause of your DVT. The size and location of your DVT, or having more than one DVT. Your risk for bleeding or developing more clots. Other medical conditions you may have. Treatment may include: Taking a blood thinner, also called an anticoagulant, to prevent clots from forming and growing. Wearing compression stockings, if directed. Injecting medicines into the affected vein to break up the clot (catheter-directed thrombolysis). This is used only for severe DVT and only if a specialist recommends it. Specific surgical procedures, when DVT is severe or hard to treat. These may be done to: Isolate and remove your clot. Place an inferior vena cava (IVC) filter in a large vein to catch blood clots before they reach your lungs. You may get some medical treatments for 6 months or longer. Follow these instructions at home: If  you are taking blood thinners: Talk with your health care provider before you take any medicines that contain aspirin or NSAIDs, such as ibuprofen. These medicines increase your risk for dangerous bleeding. Take your medicine exactly as told, at the same time every day. Do not skip a dose. Do not take more than the prescribed dose.  This is important. Ask your health care provider about foods and medicines that could change the way your blood thinner works (may interact). Avoid these foods and medicines if you are told to do so. Avoid anything that may cause bleeding or bruising. You may bleed more easily while taking blood thinners. Be very careful when using knives, scissors, or other sharp objects. Use an electric razor instead of a blade. Avoid activities that could cause injury or bruising, and follow instructions for preventing falls. Tell your health care provider if you have had any internal bleeding, bleeding ulcers, or neurologic diseases, such as strokes or cerebral aneurysms. Wear a medical alert bracelet or carry a card that lists what medicines you take. General instructions Take over-the-counter and prescription medicines only as told by your health care provider. Return to your normal activities as told by your health care provider. Ask your health care provider what activities are safe for you. If recommended, wear compression stockings as told by your health care provider. These stockings help to prevent blood clots and reduce swelling in your legs. Keep all follow-up visits as told by your health care provider. This is important. Contact a health care provider if: You miss a dose of your blood thinner. You have new or worse pain, swelling, or redness in an arm or a leg. You have worsening numbness or tingling in an arm or a leg. You have unusual bruising. Get help right away if: You have signs or symptoms that a blood clot has moved to the lungs. These may include: Shortness of breath. Chest pain. Fast or irregular heartbeats (palpitations). Light-headedness or dizziness. Coughing up blood. You have signs or symptoms that your blood is too thin. These may include: Blood in your vomit, stool, or urine. A cut that will not stop bleeding. A menstrual period that is heavier than usual. A severe  headache or confusion. These symptoms may represent a serious problem that is an emergency. Do not wait to see if the symptoms will go away. Get medical help right away. Call your local emergency services (911 in the [place] Do not drive yourself to the hospital. Summary Deep vein thrombosis (DVT) happens when a blood clot forms in a deep vein. This may occur in the lower leg, thigh, pelvis, or arm. Symptoms affect the arm or leg and can include swelling, pain, tenderness, warmth, redness, or discoloration. This condition may be treated with medicines or compression stockings. In severe cases, surgery may be done. If you are taking blood thinners, take them exactly as told. Do not skip a dose. Do not take more than is prescribed. Get help right away if you have shortness of breath, chest pain, fast or irregular heartbeats, or blood in your vomit, urine, or stool. This information is not intended to replace advice given to you by your health care provider. Make sure you discuss any questions you have with your health care provider. Document Revised: 09/07/2019 Document Reviewed: 09/08/2019 Elsevier Patient Education  2022 [employer].

## 2021-05-28 ENCOUNTER — Ambulatory Visit (HOSPITAL_COMMUNITY)
Admission: RE | Admit: 2021-05-28 | Discharge: 2021-05-28 | Disposition: A | Discharge status: Home / Self Care | Payer: BC Managed Care – PPO | Source: Ambulatory Visit | Attending: Internal Medicine | Admitting: Internal Medicine

## 2021-05-28 ENCOUNTER — Other Ambulatory Visit: Payer: Self-pay | Admitting: Nurse Practitioner

## 2021-05-28 DIAGNOSIS — R0989 Other specified symptoms and signs involving the circulatory and respiratory systems: Secondary | ICD-10-CM

## 2021-05-28 DIAGNOSIS — I82401 Acute embolism and thrombosis of unspecified deep veins of right lower extremity: Secondary | ICD-10-CM | POA: Diagnosis present

## 2021-05-28 DIAGNOSIS — R7989 Other specified abnormal findings of blood chemistry: Secondary | ICD-10-CM

## 2021-05-28 LAB — CBC WITH DIFFERENTIAL/PLATELET
Absolute Monocytes: 613 cells/uL (ref 200–950)
Basophils Absolute: 42 cells/uL (ref 0–200)
Basophils Relative: 0.5 %
Eosinophils Absolute: 269 cells/uL (ref 15–500)
Eosinophils Relative: 3.2 %
HCT: 44.5 % (ref 38.5–50.0)
Hemoglobin: 15.6 g/dL (ref 13.2–17.1)
Lymphs Abs: 2050 cells/uL (ref 850–3900)
MCH: 32.1 pg (ref 27.0–33.0)
MCHC: 35.1 g/dL (ref 32.0–36.0)
MCV: 91.6 fL (ref 80.0–100.0)
MPV: 10.1 fL (ref 7.5–12.5)
Monocytes Relative: 7.3 %
Neutro Abs: 5426 cells/uL (ref 1500–7800)
Neutrophils Relative %: 64.6 %
Platelets: 210 10*3/uL (ref 140–400)
RBC: 4.86 10*6/uL (ref 4.20–5.80)
RDW: 12.3 % (ref 11.0–15.0)
Total Lymphocyte: 24.4 %
WBC: 8.4 10*3/uL (ref 3.8–10.8)

## 2021-05-28 LAB — LIPID PANEL
Cholesterol: 162 mg/dL (ref <200)
HDL: 41 mg/dL (ref >=40)
LDL Cholesterol (Calc): 94 mg/dL (calc)
Non-HDL Cholesterol (Calc): 121 mg/dL (calc) (ref <130)
Total CHOL/HDL Ratio: 4 (calc) (ref <5.0)
Triglycerides: 169 mg/dL — ABNORMAL HIGH (ref <150)

## 2021-05-28 LAB — COMPLETE METABOLIC PANEL WITH GFR
AG Ratio: 1.8 (calc) (ref 1.0–2.5)
ALT: 18 U/L (ref 9–46)
AST: 17 U/L (ref 10–35)
Albumin: 4.4 g/dL (ref 3.6–5.1)
Alkaline phosphatase (APISO): 54 U/L (ref 35–144)
BUN/Creatinine Ratio: 15 (calc) (ref 6–22)
BUN: 21 mg/dL (ref 7–25)
CO2: 30 mmol/L (ref 20–32)
Calcium: 10 mg/dL (ref 8.6–10.3)
Chloride: 102 mmol/L (ref 98–110)
Creat: 1.37 mg/dL — ABNORMAL HIGH (ref 0.70–1.28)
Globulin: 2.4 g/dL (calc) (ref 1.9–3.7)
Glucose, Bld: 92 mg/dL (ref 65–99)
Potassium: 3.9 mmol/L (ref 3.5–5.3)
Sodium: 140 mmol/L (ref 135–146)
Total Bilirubin: 0.8 mg/dL (ref 0.2–1.2)
Total Protein: 6.8 g/dL (ref 6.1–8.1)
eGFR: 55 mL/min/{1.73_m2} — ABNORMAL LOW (ref >=60)

## 2021-05-28 MED ORDER — XARELTO VTE STARTER PACK 15 & 20 MG PO TBPK: ORAL_TABLET | ORAL | 0 refills | Status: DC

## 2021-05-28 NOTE — Progress Notes (Unsigned)
Patient has R mid SFV to popiteal non-occlusive DVT, most likley chronic based on the last ultrasound findings. 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

## 2021-05-28 NOTE — Progress Notes (Signed)
Sent in Xarelto to his pharmacy. Put in hematology consult since he has had recurrence of DVT twice now once he quit Xarelto.  I would like them to determine if further testing is needed and if he needs Xarelto lifelong.

## 2021-05-29 ENCOUNTER — Telehealth: Payer: Self-pay | Admitting: Hematology and Oncology

## 2021-05-29 NOTE — Telephone Encounter (Signed)
Scheduled appt per 9/1 referral. Pt is aware of appt date and time.  

## 2021-06-08 ENCOUNTER — Inpatient Hospital Stay: Payer: BC Managed Care – PPO | Attending: Hematology and Oncology | Admitting: Hematology and Oncology

## 2021-06-08 ENCOUNTER — Encounter: Payer: Self-pay | Admitting: Hematology and Oncology

## 2021-06-08 ENCOUNTER — Other Ambulatory Visit: Payer: Self-pay

## 2021-06-08 DIAGNOSIS — E66813 Obesity, class 3: Secondary | ICD-10-CM

## 2021-06-08 DIAGNOSIS — Z79899 Other long term (current) drug therapy: Secondary | ICD-10-CM | POA: Diagnosis not present

## 2021-06-08 DIAGNOSIS — Z6841 Body Mass Index (BMI) 40.0 and over, adult: Secondary | ICD-10-CM | POA: Insufficient documentation

## 2021-06-08 DIAGNOSIS — Z7901 Long term (current) use of anticoagulants: Secondary | ICD-10-CM | POA: Insufficient documentation

## 2021-06-08 DIAGNOSIS — R7989 Other specified abnormal findings of blood chemistry: Secondary | ICD-10-CM | POA: Diagnosis not present

## 2021-06-08 DIAGNOSIS — N183 Chronic kidney disease, stage 3 unspecified: Secondary | ICD-10-CM

## 2021-06-08 DIAGNOSIS — E559 Vitamin D deficiency, unspecified: Secondary | ICD-10-CM | POA: Diagnosis not present

## 2021-06-08 DIAGNOSIS — N400 Enlarged prostate without lower urinary tract symptoms: Secondary | ICD-10-CM | POA: Diagnosis not present

## 2021-06-08 DIAGNOSIS — J309 Allergic rhinitis, unspecified: Secondary | ICD-10-CM | POA: Diagnosis not present

## 2021-06-08 DIAGNOSIS — K219 Gastro-esophageal reflux disease without esophagitis: Secondary | ICD-10-CM | POA: Diagnosis not present

## 2021-06-08 DIAGNOSIS — E291 Testicular hypofunction: Secondary | ICD-10-CM | POA: Diagnosis not present

## 2021-06-08 DIAGNOSIS — I82401 Acute embolism and thrombosis of unspecified deep veins of right lower extremity: Secondary | ICD-10-CM | POA: Diagnosis not present

## 2021-06-08 NOTE — Assessment & Plan Note (Addendum)
I am not concerned that the recent ultrasound finding is related to a new clot His new symptoms could be due to thrombophlebitis However, his risk of recurrent DVT is high given his sedentary lifestyle and high risk of dehydration For now, recommend he continues anticoagulation therapy for at least 6 months We have extensive discussions about the importance of aggressive lifestyle modification including increasing oral fluid hydration and weight loss I plan to see him again in 6 months for further follow-up

## 2021-06-08 NOTE — Assessment & Plan Note (Signed)
He has significant class III obesity due to poor lifestyle choices We have extensive discussion about the importance of dietary modification and exercise He appears to be motivated to lose some weight I am hopeful he can lose approximately 1 to 2 pounds per month I will see him back in 6 months

## 2021-06-08 NOTE — Assessment & Plan Note (Signed)
He has chronic elevated serum creatinine, could be due to his diuretic or chronic kidney disease stage III We discussed the importance of adequate hydration Monitor closely for now

## 2021-06-08 NOTE — Progress Notes (Signed)
[facility] CONSULT NOTE  Patient Care Team: [name], MD as PCP - General (Internal Medicine)  ASSESSMENT & PLAN: DVT, lower extremity, recurrent, right (HCC) I am not concerned that the recent ultrasound finding is related to a new clot His new symptoms could be due to thrombophlebitis However, his risk of recurrent DVT is high given his sedentary lifestyle and high risk of dehydration For now, recommend he continues anticoagulation therapy for at least 6 months We have extensive discussions about the importance of aggressive lifestyle modification including increasing oral fluid hydration and weight loss I plan to see him again in 6 months for further follow-up  Obesity, Class III, BMI 40-49.9 (morbid obesity) (HCC) He has significant class III obesity due to poor lifestyle choices We have extensive discussion about the importance of dietary modification and exercise He appears to be motivated to lose some weight I am hopeful he can lose approximately 1 to 2 pounds per month I will see him back in 6 months  Chronic kidney disease (CKD), stage III (moderate) (HCC) He has chronic elevated serum creatinine, could be due to his diuretic or chronic kidney disease stage III We discussed the importance of adequate hydration Monitor closely for now   All questions were answered. The patient knows to call the clinic with any problems, questions or concerns. The total time spent in the appointment was 60 minutes encounter with patients including review of chart and various tests results, discussions about plan of care and coordination of care plan  Artis Delay, MD 06/08/2021  CHIEF COMPLAINTS/PURPOSE OF CONSULTATION:  Recurrent DVT  HISTORY OF PRESENTING ILLNESS:  Alex Lewis 70 y.o. male is here because of recent diagnosis of recurrent right lower extremity DVT The patient has been somewhat healthy all his life He did not recall any precipitating event leading  to his first DVT in 2020 He had remote history of right knee surgery in 1977 He denies preceding trauma, long distance travel, smoking or testosterone replacement therapy prior to DVT diagnosis in 2020 He did state he has a somewhat sedentary lifestyle He does not drink enough liquid daily and is on a diuretic His children live quite far away and he frequently drives to visit them and each journey is estimated to be 3 hours long  His original DVT was in 2020 On 03/19/2019, he had venous Doppler ultrasound after presentation with leg swelling  Right: Findings consistent with acute non-occlusive deep vein thrombosis involving the right femoral vein, right popliteal vein, and right posterior tibial veins. No cystic structure found in the popliteal fossa.  All other veins visualized appear fully compressible and demonstrate appropriate Doppler characteristics.  Left: No evidence of deep vein thrombosis in the lower extremity. No indirect evidence of obstruction proximal to the inguinal ligament. No cystic structure found in the popliteal fossa.     Patient was treated for a RLE DVT in June 2020 on Xarelto x 3 months  By February 2022, the dose of Xarelto was reduced to 10 mg dose and apparently, the patient stopped taking it recently as a plan to taper him off anticoagulation therapy.  He was probably off for about a month until he started to experience symptoms of right lower extremity swelling and pain  Most recently, on May 28, 2021, he had another ultrasound venous Doppler again which showed RIGHT:  - Findings consistent with age indeterminate non-occlusive deep vein thrombosis involving the right femoral vein, and right popliteal vein.  - No  cystic structure found in the popliteal fossa.     LEFT:  - No evidence of deep vein thrombosis in the lower extremity. No indirect evidence of obstruction proximal to the inguinal ligament.  - No cystic structure found in the popliteal fossa.   He  was restarted back on Xarelto He denies recent chest pain on exertion, shortness of breath on minimal exertion, pre-syncopal episodes, hemoptysis, or palpitation. He denies recent  history of trauma, recent surgery, smoking or prolonged immobilization. He had no prior history or diagnosis of cancer. His age appropriate screening programs are up-to-date. He had prior surgeries before and never had perioperative thromboembolic events. The patient had never been placed on testosterone replacement therapy  There is no family history of blood clots or miscarriages.  MEDICAL HISTORY:  Past Medical History:  Diagnosis Date   Allergic rhinitis    BPH (benign prostatic hyperplasia)    GERD (gastroesophageal reflux disease)    Hypogonadism, male    Kidney stones    Morbid obesity (HCC)    Pre-diabetes    Vitamin D deficiency     SURGICAL HISTORY: Past Surgical History:  Procedure Laterality Date   FLEXIBLE SIGMOIDOSCOPY  1992   KNEE SURGERY Right 1977   TONSILLECTOMY AND ADENOIDECTOMY  1960    SOCIAL HISTORY: Social History   Socioeconomic History   Marital status: Married    Spouse name: Not on file   Number of children: Not on file   Years of education: Not on file   Highest education level: Not on file  Occupational History   Not on file  Tobacco Use   Smoking status: Former    Types: Cigarettes    Quit date: 09/28/1975    Years since quitting: 45.7   Smokeless tobacco: Never  Substance and Sexual Activity   Alcohol use: Yes    Comment: occasional   Drug use: No   Sexual activity: Not on file  Other Topics Concern   Not on file  Social History Narrative   Not on file   Social Determinants of Health   Financial Resource Strain: Not on file  Food Insecurity: Not on file  Transportation Needs: Not on file  Physical Activity: Not on file  Stress: Not on file  Social Connections: Not on file  Intimate Partner Violence: Not on file    FAMILY HISTORY: Family  History  Problem Relation Age of Onset   Cancer Father        lung   Diabetes Father    Diabetes Brother     ALLERGIES:  is allergic to penicillins.  MEDICATIONS:  Current Outpatient Medications  Medication Sig Dispense Refill   acetaminophen (TYLENOL) 650 MG CR tablet Take 650 mg by mouth every 8 (eight) hours as needed for pain.     B Complex-C (SUPER B COMPLEX PO) Take 1 tablet by mouth daily.     bisoprolol-hydrochlorothiazide (ZIAC) 5-6.25 MG tablet Take  1 tablet  Daily  for BP / Patient knows to take by mouth 90 tablet 3   Calcium Carbonate Antacid (TUMS PO) Take by mouth.     Cholecalciferol (VITAMIN D-3) 5000 UNITS TABS Take 10,000 Units by mouth daily.     fluticasone (FLONASE) 50 MCG/ACT nasal spray SPRAY 2 SPRAYS INTO EACH NOSTRIL EVERY DAY 48 mL 1   loratadine (CLARITIN) 10 MG tablet Take 10 mg by mouth daily.     montelukast (SINGULAIR) 10 MG tablet TAKE 1 TABLET BY MOUTH DAILY FOR ALLERGIES 90 tablet  3   Rivaroxaban Stater Pack, 15 mg and 20 mg, (XARELTO STARTER PACK) Take as directed on package: Start with one  tablet by mouth twice a day with food. On Day 22, switch to one  tablet once a day with food. 51 each 0   No current facility-administered medications for this visit.    REVIEW OF SYSTEMS:   Constitutional: Denies fevers, chills or abnormal night sweats Eyes: Denies blurriness of vision, double vision or watery eyes Ears, nose, mouth, throat, and face: Denies mucositis or sore throat Respiratory: Denies cough, dyspnea or wheezes Cardiovascular: Denies palpitation, chest discomfort or lower extremity swelling Gastrointestinal:  Denies nausea, heartburn or change in bowel habits Skin: Denies abnormal skin rashes Lymphatics: Denies new lymphadenopathy or easy bruising Neurological:Denies numbness, tingling or new weaknesses Behavioral/Psych: Mood is stable, no new changes  All other systems were reviewed with the patient and are negative.  PHYSICAL  EXAMINATION: ECOG PERFORMANCE STATUS: 1 - Symptomatic but completely ambulatory  Vitals:   06/08/21 1312  BP: 122/74  Pulse: 65  Resp: 18  Temp: 98.8 F (37.1 C)  SpO2: 94%   Filed Weights   06/08/21 1312  Weight: 254 lb 6.4 oz (115.4 kg)    GENERAL:alert, no distress and comfortable SKIN: skin color, texture, turgor are normal, no rashes or significant lesions EYES: normal, conjunctiva are pink and non-injected, sclera clear OROPHARYNX:no exudate, no erythema and lips, buccal mucosa, and tongue normal  NECK: supple, thyroid normal size, non-tender, without nodularity LYMPH:  no palpable lymphadenopathy in the cervical, axillary or inguinal LUNGS: clear to auscultation and percussion with normal breathing effort HEART: regular rate & rhythm and no murmurs and no lower extremity edema.  He has elastic compression hose on the right lower extremity ABDOMEN:abdomen soft, non-tender and normal bowel sounds Musculoskeletal:no cyanosis of digits and no clubbing  PSYCH: alert & oriented x 3 with fluent speech NEURO: no focal motor/sensory deficits  LABORATORY DATA:  I have reviewed the data as listed Lab Results  Component Value Date   WBC 8.4 05/27/2021   HGB 15.6 05/27/2021   HCT 44.5 05/27/2021   MCV 91.6 05/27/2021   PLT 210 05/27/2021    RADIOGRAPHIC STUDIES: I have personally reviewed the radiological images as listed and agreed with the findings in the report. VAS [place] LOWER EXTREMITY VENOUS (DVT)  Result Date: 05/29/2021  Lower Venous DVT Study Patient Name:  LES LONGMORE Flock  Date of Exam:   05/28/2021 Medical Rec #: [id number]      Accession #:    [id number] Date of Birth: 06-08-1951       Patient Gender: M Patient Age:   70 years Exam Location:  [facility] Procedure:      VAS [place] LOWER EXTREMITY VENOUS (DVT) Referring Phys: DANA MULL --------------------------------------------------------------------------------  Indications: Bilateral lower leg swelling since stopping his  anti-coagulant therapy about 2 weeks ago. Patient denies chest pain and SOB.  Anticoagulation: Just started Xarelto yesterday. Comparison Study: Prior bilateral leg venous duplex exam on 03/19/2019 showed                   right mid SFV to the posterior tibial vein non-occlusive DVT,                   left leg was negative for DVT. Performing Technologist: Carlos American RVT, RDCS (AE), RDMS  Examination Guidelines: A complete evaluation includes B-mode imaging, spectral Doppler, color Doppler, and power Doppler as needed of all accessible portions of  each vessel. Bilateral testing is considered an integral part of a complete examination. Limited examinations for reoccurring indications may be performed as noted. The reflux portion of the exam is performed with the patient in reverse Trendelenburg.  +---------+---------------+---------+-----------+---------------+--------------+ RIGHT    CompressibilityPhasicitySpontaneityProperties     Thrombus Aging +---------+---------------+---------+-----------+---------------+--------------+ CFV      Full           Yes      Yes                                      +---------+---------------+---------+-----------+---------------+--------------+ SFJ      Full           Yes      Yes                                      +---------+---------------+---------+-----------+---------------+--------------+ FV Prox  Full           Yes      Yes                                      +---------+---------------+---------+-----------+---------------+--------------+ FV Mid   Partial        Yes      Yes                       collaterals                                                               noted; age                                                                indeterminate  +---------+---------------+---------+-----------+---------------+--------------+ FV DistalPartial        Yes      Yes                       collaterals                                                                noted; age                                                                indeterminate  +---------+---------------+---------+-----------+---------------+--------------+ PFV      Full                                                             +---------+---------------+---------+-----------+---------------+--------------+  POP      Partial        Yes      Yes        with striationsAge                                                                       Indeterminate  +---------+---------------+---------+-----------+---------------+--------------+ PTV      Full           Yes      Yes                                      +---------+---------------+---------+-----------+---------------+--------------+ PERO     Full           Yes      Yes                                      +---------+---------------+---------+-----------+---------------+--------------+ Gastroc  Full                                                             +---------+---------------+---------+-----------+---------------+--------------+ GSV      Full           Yes      Yes                                      +---------+---------------+---------+-----------+---------------+--------------+   +---------+---------------+---------+-----------+----------+--------------+ LEFT     CompressibilityPhasicitySpontaneityPropertiesThrombus Aging +---------+---------------+---------+-----------+----------+--------------+ CFV      Full           Yes      Yes                                 +---------+---------------+---------+-----------+----------+--------------+ SFJ      Full           Yes      Yes                                 +---------+---------------+---------+-----------+----------+--------------+ FV Prox  Full           Yes      Yes                                  +---------+---------------+---------+-----------+----------+--------------+ FV Mid   Full           Yes      Yes                                 +---------+---------------+---------+-----------+----------+--------------+ FV DistalFull           Yes  Yes                                 +---------+---------------+---------+-----------+----------+--------------+ PFV      Full                                                        +---------+---------------+---------+-----------+----------+--------------+ POP      Full           Yes      Yes                                 +---------+---------------+---------+-----------+----------+--------------+ PTV      Full           Yes      Yes                                 +---------+---------------+---------+-----------+----------+--------------+ PERO     Full           Yes      Yes                                 +---------+---------------+---------+-----------+----------+--------------+ Gastroc  Full                                                        +---------+---------------+---------+-----------+----------+--------------+ GSV      Full           Yes      Yes                                 +---------+---------------+---------+-----------+----------+--------------+    Findings reported to Rance Muir, NP through secure chat at 2:50 pm.  Summary: RIGHT: - Findings consistent with age indeterminate non-occlusive deep vein thrombosis involving the right femoral vein, and right popliteal vein. - No cystic structure found in the popliteal fossa.  LEFT: - No evidence of deep vein thrombosis in the lower extremity. No indirect evidence of obstruction proximal to the inguinal ligament. - No cystic structure found in the popliteal fossa.  *See table(s) above for measurements and observations. Electronically signed by Lance Muss MD on 05/29/2021 at 10:32:10 AM.    Final

## 2021-06-29 ENCOUNTER — Other Ambulatory Visit: Payer: Self-pay

## 2021-06-29 MED ORDER — RIVAROXABAN 20 MG PO TABS: 20.0000 mg | ORAL_TABLET | Freq: Every day | ORAL | 2 refills | Status: DC

## 2021-08-13 ENCOUNTER — Encounter: Payer: Self-pay | Admitting: Internal Medicine

## 2021-08-13 DIAGNOSIS — R7303 Prediabetes: Secondary | ICD-10-CM | POA: Insufficient documentation

## 2021-08-13 NOTE — Patient Instructions (Signed)

## 2021-08-13 NOTE — Progress Notes (Signed)
Annual  Screening/Preventative Visit  & Comprehensive Evaluation & Examination  Future Appointments  Date Time Provider [place]  08/14/2021 10:00 AM Unk Pinto, MD [facility] None  12/07/2021 12:40 PM Heath Lark, MD [facility] None  08/16/2022 10:00 AM Unk Pinto, MD [facility] None            This very nice 70 y.o. MWM  presents for a Screening /Preventative Visit & comprehensive evaluation and management of multiple medical co-morbidities.  Patient has been followed for HTN, HLD, PreDiabetes  and Vitamin D Deficiency.       In June 2020 & Sept 2020 , he was treated for 2 separate episodes of DVT of the RLE and has remained on Xarelto.         HTN predates circa 2001.   Patient's BP has been controlled at home.  Today's BP is at goal altho borderline diastolic  -  [id number]. 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  Patient denies any cardiac symptoms as chest pain, palpitations, shortness of breath, dizziness or ankle swelling.       Patient's hyperlipidemia is controlled with diet and medications. Patient denies myalgias or other medication SE's. Last lipids were at goal:  Lab Results  Component Value Date   CHOL 162 05/27/2021   HDL 41 05/27/2021   LDLCALC 94 05/27/2021   TRIG 169 (H) 05/27/2021   CHOLHDL 4.0 05/27/2021         Patient has Morbid Obesity (BMI 36+)  & prediabetes (A1c 6.2% / 2011)     and patient denies reactive hypoglycemic symptoms, visual blurring, diabetic polys or paresthesias. Last A1c was not at goal :    Lab Results  Component Value Date   HGBA1C 6.1 (H) 02/06/2021          Finally, patient has history of Vitamin D Deficiency ("16" /2008) and last vitamin D was at goal :   Lab Results  Component Value Date   VD25OH 80 02/06/2021     Current Outpatient Medications on File Prior to Visit  Medication Sig   acetaminophen 650 MG CR tablet Take 650 mg every 8 hours as needed for pain.   SUPER B COMPLEX Take 1 tablet  daily.   bisoprolol-hctz 5-6.25 MG tablet Take  1 tablet  Daily  for BP    TUMS  Take by mouth.   VITAMIN D  5000 UNITS TABS Take 10,000 Units  daily.   FLONASE  nasal spray 2 SPRAYS EACH NOSTRIL EVERY DAY   loratadine 10 MG tablet Take daily.   montelukast 10 MG tablet TAKE 1 TABLET  DAILY FOR ALLERGIES   XARELTO  20 MG TABS  Take 1 tablet daily.     Allergies  Allergen Reactions   Penicillins     REACTION: unknown     Past Medical History:  Diagnosis Date   Allergic rhinitis    BPH (benign prostatic hyperplasia)    GERD (gastroesophageal reflux disease)    Hypogonadism, male    Kidney stones    Morbid obesity ([place])    Pre-diabetes    Vitamin D deficiency      Health Maintenance  Topic Date Due   Hepatitis C Screening  Never done   Zoster Vaccines- Shingrix (1 of 2) Never done   COLONOSCOPY  02/15/2018   TETANUS/TDAP  09/06/2024   Pneumonia Vaccine 36+ Years old  Completed   INFLUENZA VACCINE  Completed   COVID-19 Vaccine  Completed   HPV VACCINES  Aged Out     Immunization History  Administered Date(s) Administered   DTaP 01/26/2000   Fluad Quad(high Dose 65+) 07/22/2021   Influenza Whole 06/22/2013   Influenza, High Dose 06/20/2018, 05/13/2019, 06/20/2020   Influenza 07/13/2014, 06/26/2016, 05/13/2019   PFIZER Covid-19 Tri-Sucrose Vacc 01/24/2021   PFIZER SARS-COV-2 Vacc 11/03/2019, 11/24/2019, 06/20/2020   PPD Test 05/29/2014, 07/30/2015   Pfizer Covid-19 Vacc Bivalent Booster  07/22/2021   Pneumococcal -13 05/13/2017   Pneumococcal -23 06/20/2018   Tdap 09/06/2014   Zoster, Live 04/22/2013    Last Colon - 02/16/2008 - Dr Henrene Pastor - 68 yr f/u overdue May 2019 - patient aware   Past Surgical History:  Procedure Laterality Date   [place]     Family History  Problem Relation Age of Onset   Cancer Father        lung   Diabetes Father    Diabetes Brother       Social History   Tobacco Use   Smoking status: Former    Types: Cigarettes    Quit date: 09/28/1975    Years since quitting: 45.9   Smokeless tobacco: Never  Substance Use Topics   Alcohol use: Yes    Comment: occasional   Drug use: No      ROS Constitutional: Denies fever, chills, weight loss/gain, headaches, insomnia,  night sweats or change in appetite. Does c/o fatigue. Eyes: Denies redness, blurred vision, diplopia, discharge, itchy or watery eyes.  ENT: Denies discharge, congestion, post nasal drip, epistaxis, sore throat, earache, hearing loss, dental pain, Tinnitus, Vertigo, Sinus pain or snoring.  Cardio: Denies chest pain, palpitations, irregular heartbeat, syncope, dyspnea, diaphoresis, orthopnea, PND, claudication or edema Respiratory: denies cough, dyspnea, DOE, pleurisy, hoarseness, laryngitis or wheezing.  Gastrointestinal: Denies dysphagia, heartburn, reflux, water brash, pain, cramps, nausea, vomiting, bloating, diarrhea, constipation, hematemesis, melena, hematochezia, jaundice or hemorrhoids Genitourinary: Denies dysuria, frequency, urgency, nocturia, hesitancy, discharge, hematuria or flank pain Musculoskeletal: Denies arthralgia, myalgia, stiffness, Jt. Swelling, pain, limp or strain/sprain. Denies Falls. Skin: Denies puritis, rash, hives, warts, acne, eczema or change in skin lesion Neuro: No weakness, tremor, incoordination, spasms, paresthesia or pain Psychiatric: Denies confusion, memory loss or sensory loss. Denies Depression. Endocrine: Denies change in weight, skin, hair change, nocturia, and paresthesia, diabetic polys, visual blurring or hyper / hypo glycemic episodes.  Heme/Lymph: No excessive bleeding, bruising or enlarged lymph nodes.   Physical Exam  BP 136/88   Pulse 65   Temp 97.9 F (36.6 C)   Ht 5\' 10"  (1.778 m)   Wt 253 lb (114.8 kg)   SpO2 96%   BMI 36.30 kg/m   General Appearance: Well nourished and well groomed and in no  apparent distress.  Eyes: PERRLA, EOMs, conjunctiva no swelling or erythema, normal fundi and vessels. Sinuses: No frontal/maxillary tenderness ENT/Mouth: EACs patent / TMs  nl. Nares clear without erythema, swelling, mucoid exudates. Oral hygiene is good. No erythema, swelling, or exudate. Tongue normal, non-obstructing. Tonsils not swollen or erythematous. Hearing normal.  Neck: Supple, thyroid not palpable. No bruits, nodes or JVD. Respiratory: Respiratory effort normal.  BS equal and clear bilateral without rales, rhonci, wheezing or stridor. Cardio: Heart sounds are normal with regular rate and rhythm and no murmurs, rubs or gallops. Peripheral pulses are normal and equal bilaterally without edema. No aortic or femoral bruits. Chest: symmetric with normal excursions and percussion.  Abdomen: Soft, with Nl bowel sounds.  Nontender, no guarding, rebound, hernias, masses, or organomegaly.  Lymphatics: Non tender without lymphadenopathy.  Musculoskeletal: Full ROM all peripheral extremities, joint stability, 5/5 strength, and normal gait. Skin: Warm and dry without rashes, lesions, cyanosis, clubbing or  ecchymosis.  Neuro: Cranial nerves intact, reflexes equal bilaterally. Normal muscle tone, no cerebellar symptoms. Sensation intact.  Pysch: Alert and oriented X 3 with normal affect, insight and judgment appropriate.   Assessment and Plan  1. Annual Preventative/Screening Exam   1. Encounter for general adult medical examination with abnormal findings  - Ambulatory referral to Gastroenterology  2. Essential hypertension  - EKG 12-Lead - [place], RETROPERITNL ABD,  LTD - CBC with Differential/Platelet - COMPLETE METABOLIC PANEL WITH GFR - Magnesium - TSH  3. Hyperlipidemia, mixed  - EKG 12-Lead - [place], RETROPERITNL ABD,  LTD - Lipid panel - TSH  4. Abnormal glucose  - EKG 12-Lead - [place], RETROPERITNL ABD,  LTD - Hemoglobin A1c - Insulin, random  5. Vitamin D deficiency  -  VITAMIN D 25 Hydroxy (Vit-D Deficiency, Fractures)  6. Prediabetes  - EKG 12-Lead - [place], RETROPERITNL ABD,  LTD - Hemoglobin A1c - Insulin, random  7. 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 Class 2 obesity due to excess calories with body mass index (BMI) of 35.0 to 35.9 in adult, unspecified whether serious comorbidity present  - TSH  9. BPH with obstruction/lower urinary tract symptoms  - Urinalysis, Routine w reflex microscopic - PSA  10. Screening for colorectal cancer   11. Screening for prostate cancer  - PSA  12. Screening for ischemic heart disease  - EKG 12-Lead  13. FH: hypertension  - EKG 12-Lead - [place], RETROPERITNL ABD,  LTD  14. Former smoker  - EKG 12-Lead - [place], RETROPERITNL ABD,  LTD  15. Screening for AAA (aortic abdominal aneurysm)  - [place], RETROPERITNL ABD,  LTD  16. Medication management  - Urinalysis, Routine w reflex microscopic - Microalbumin / creatinine urine ratio - PSA - CBC with Differential/Platelet - COMPLETE METABOLIC PANEL WITH GFR - Magnesium - Lipid panel - TSH - Hemoglobin A1c - Insulin, random - VITAMIN D 25 Hydroxy          Patient was counseled in prudent diet, weight control to achieve/maintain BMI less than 25, BP monitoring, regular exercise and medications as discussed.  Discussed med effects and SE's. Routine screening labs and tests as requested with regular follow-up as recommended. Over 40 minutes of exam, counseling, chart review and high complex critical decision making was performed   Marinus Maw, MD

## 2021-08-14 ENCOUNTER — Ambulatory Visit (INDEPENDENT_AMBULATORY_CARE_PROVIDER_SITE_OTHER): Payer: BC Managed Care – PPO | Admitting: Internal Medicine

## 2021-08-14 ENCOUNTER — Encounter: Payer: Self-pay | Admitting: Internal Medicine

## 2021-08-14 ENCOUNTER — Other Ambulatory Visit: Payer: Self-pay

## 2021-08-14 VITALS — BP 136/88 | HR 65 | Temp 97.9°F | Ht 70.0 in | Wt 253.0 lb

## 2021-08-14 DIAGNOSIS — E559 Vitamin D deficiency, unspecified: Secondary | ICD-10-CM | POA: Diagnosis not present

## 2021-08-14 DIAGNOSIS — Z13220 Encounter for screening for lipoid disorders: Secondary | ICD-10-CM

## 2021-08-14 DIAGNOSIS — Z0001 Encounter for general adult medical examination with abnormal findings: Secondary | ICD-10-CM

## 2021-08-14 DIAGNOSIS — Z131 Encounter for screening for diabetes mellitus: Secondary | ICD-10-CM

## 2021-08-14 DIAGNOSIS — N401 Enlarged prostate with lower urinary tract symptoms: Secondary | ICD-10-CM | POA: Diagnosis not present

## 2021-08-14 DIAGNOSIS — R350 Frequency of micturition: Secondary | ICD-10-CM

## 2021-08-14 DIAGNOSIS — I82401 Acute embolism and thrombosis of unspecified deep veins of right lower extremity: Secondary | ICD-10-CM

## 2021-08-14 DIAGNOSIS — E6609 Other obesity due to excess calories: Secondary | ICD-10-CM

## 2021-08-14 DIAGNOSIS — Z136 Encounter for screening for cardiovascular disorders: Secondary | ICD-10-CM

## 2021-08-14 DIAGNOSIS — E782 Mixed hyperlipidemia: Secondary | ICD-10-CM

## 2021-08-14 DIAGNOSIS — Z79899 Other long term (current) drug therapy: Secondary | ICD-10-CM

## 2021-08-14 DIAGNOSIS — R7303 Prediabetes: Secondary | ICD-10-CM

## 2021-08-14 DIAGNOSIS — I10 Essential (primary) hypertension: Secondary | ICD-10-CM | POA: Diagnosis not present

## 2021-08-14 DIAGNOSIS — Z125 Encounter for screening for malignant neoplasm of prostate: Secondary | ICD-10-CM | POA: Diagnosis not present

## 2021-08-14 DIAGNOSIS — Z87891 Personal history of nicotine dependence: Secondary | ICD-10-CM

## 2021-08-14 DIAGNOSIS — Z1389 Encounter for screening for other disorder: Secondary | ICD-10-CM | POA: Diagnosis not present

## 2021-08-14 DIAGNOSIS — Z0000 Encounter for general adult medical examination without abnormal findings: Secondary | ICD-10-CM

## 2021-08-14 DIAGNOSIS — R7309 Other abnormal glucose: Secondary | ICD-10-CM

## 2021-08-14 DIAGNOSIS — Z1211 Encounter for screening for malignant neoplasm of colon: Secondary | ICD-10-CM

## 2021-08-14 DIAGNOSIS — Z8249 Family history of ischemic heart disease and other diseases of the circulatory system: Secondary | ICD-10-CM

## 2021-08-15 NOTE — Progress Notes (Signed)
============================================================ -   Test results slightly outside the reference range are not unusual. If there is anything important, I will review this with you,  otherwise it is considered normal test values.  If you have further questions,  please do not hesitate to contact me at the office or via My Chart.  ============================================================ ============================================================  -  PSA - Low - Great  ============================================================ ============================================================  -  Kidney functions worse - Have dropped down from Stage 3a to 3b  !    - Most likely due to not drinking enough fluids  -  Very important to drink adequate amounts of fluids to prevent permanent damage    - Recommend drink at least 6 bottles (16 ounces) of fluids /water /day = 96 Oz ~100 oz  - 100 oz = 3,000 cc or 3 liters / day  - >> That's 1 &1/2 bottles of a 2 liter soda bottle /day !  ============================================================ ============================================================  -  Total Chol = 156    &    LDL  Chol = 97   - Both  Excellent   - Very low risk for Heart Attack  / Stroke ============================================================ ============================================================  -  A1c  is up to 6.4% - early Diabetes - so will send in Rx for       Mounjaro 2.5 mg shot weekly x 4 weeks  - when take the 4th shot, Please         call office to have the next strength = 5 mg /week called in   - If not sure how to administer the shot,                                                         please call office to schedule a nurse visit.  ============================================================ ============================================================  -  Vitamin  D = 103 Great  - Please keep dose same (Goal range is  between 70-110)   ============================================================ ============================================================  - All Else - CBC  - Electrolytes - Liver - Magnesium & Thyroid    - all  Normal / OK ============================================================ ============================================================

## 2021-08-17 LAB — CBC WITH DIFFERENTIAL/PLATELET
Absolute Monocytes: 468 cells/uL (ref 200–950)
Basophils Absolute: 42 cells/uL (ref 0–200)
Basophils Relative: 0.7 %
Eosinophils Absolute: 150 cells/uL (ref 15–500)
Eosinophils Relative: 2.5 %
HCT: 44.8 % (ref 38.5–50.0)
Hemoglobin: 15.6 g/dL (ref 13.2–17.1)
Lymphs Abs: 1500 cells/uL (ref 850–3900)
MCH: 32 pg (ref 27.0–33.0)
MCHC: 34.8 g/dL (ref 32.0–36.0)
MCV: 91.8 fL (ref 80.0–100.0)
MPV: 10.6 fL (ref 7.5–12.5)
Monocytes Relative: 7.8 %
Neutro Abs: 3840 cells/uL (ref 1500–7800)
Neutrophils Relative %: 64 %
Platelets: 215 10*3/uL (ref 140–400)
RBC: 4.88 10*6/uL (ref 4.20–5.80)
RDW: 12.3 % (ref 11.0–15.0)
Total Lymphocyte: 25 %
WBC: 6 10*3/uL (ref 3.8–10.8)

## 2021-08-17 LAB — COMPLETE METABOLIC PANEL WITH GFR
AG Ratio: 1.5 (calc) (ref 1.0–2.5)
ALT: 15 U/L (ref 9–46)
AST: 16 U/L (ref 10–35)
Albumin: 4.2 g/dL (ref 3.6–5.1)
Alkaline phosphatase (APISO): 53 U/L (ref 35–144)
BUN/Creatinine Ratio: 12 (calc) (ref 6–22)
BUN: 19 mg/dL (ref 7–25)
CO2: 30 mmol/L (ref 20–32)
Calcium: 10.1 mg/dL (ref 8.6–10.3)
Chloride: 102 mmol/L (ref 98–110)
Creat: 1.57 mg/dL — ABNORMAL HIGH (ref 0.70–1.28)
Globulin: 2.8 g/dL (calc) (ref 1.9–3.7)
Glucose, Bld: 124 mg/dL — ABNORMAL HIGH (ref 65–99)
Potassium: 4.2 mmol/L (ref 3.5–5.3)
Sodium: 140 mmol/L (ref 135–146)
Total Bilirubin: 0.9 mg/dL (ref 0.2–1.2)
Total Protein: 7 g/dL (ref 6.1–8.1)
eGFR: 47 mL/min/{1.73_m2} — ABNORMAL LOW (ref >=60)

## 2021-08-17 LAB — MAGNESIUM: Magnesium: 1.9 mg/dL (ref 1.5–2.5)

## 2021-08-17 LAB — INSULIN, RANDOM: Insulin: 12.5 u[IU]/mL

## 2021-08-17 LAB — MICROALBUMIN / CREATININE URINE RATIO
Creatinine, Urine: 25 mg/dL (ref 20–320)
Microalb, Ur: <0.2 mg/dL

## 2021-08-17 LAB — URINALYSIS, ROUTINE W REFLEX MICROSCOPIC
Bilirubin Urine: NEGATIVE
Glucose, UA: NEGATIVE
Hgb urine dipstick: NEGATIVE
Ketones, ur: NEGATIVE
Leukocytes,Ua: NEGATIVE
Nitrite: NEGATIVE
Protein, ur: NEGATIVE
Specific Gravity, Urine: 1.005 (ref 1.001–1.035)
pH: 6.5 (ref 5.0–8.0)

## 2021-08-17 LAB — LIPID PANEL
Cholesterol: 156 mg/dL (ref <200)
HDL: 40 mg/dL (ref >=40)
LDL Cholesterol (Calc): 97 mg/dL (calc)
Non-HDL Cholesterol (Calc): 116 mg/dL (calc) (ref <130)
Total CHOL/HDL Ratio: 3.9 (calc) (ref <5.0)
Triglycerides: 93 mg/dL (ref <150)

## 2021-08-17 LAB — HEMOGLOBIN A1C
Hgb A1c MFr Bld: 6.4 % of total Hgb — ABNORMAL HIGH (ref <5.7)
Mean Plasma Glucose: 137 mg/dL
eAG (mmol/L): 7.6 mmol/L

## 2021-08-17 LAB — VITAMIN D 25 HYDROXY (VIT D DEFICIENCY, FRACTURES): Vit D, 25-Hydroxy: 103 ng/mL — ABNORMAL HIGH (ref 30–100)

## 2021-08-17 LAB — PSA: PSA: 0.72 ng/mL (ref <=4.00)

## 2021-08-17 LAB — TSH: TSH: 2.37 mIU/L (ref 0.40–4.50)

## 2021-09-07 ENCOUNTER — Other Ambulatory Visit: Payer: Self-pay | Admitting: Adult Health

## 2021-09-07 DIAGNOSIS — Z9109 Other allergy status, other than to drugs and biological substances: Secondary | ICD-10-CM

## 2021-09-21 ENCOUNTER — Other Ambulatory Visit: Payer: Self-pay | Admitting: Internal Medicine

## 2021-11-18 ENCOUNTER — Ambulatory Visit: Payer: BC Managed Care – PPO | Admitting: Nurse Practitioner

## 2021-11-18 NOTE — Progress Notes (Signed)
FOLLOW UP  Assessment and Plan:   Hypertension Fairly controlled with current medications  Monitor blood pressure at home; patient to call if consistently greater than 130/80 Continue DASH diet.   Reminder to go to the ER if any CP, SOB, nausea, dizziness, severe HA, changes vision/speech, left arm numbness and tingling and jaw pain.  Cholesterol Controlled by lifestyle modification Continue low cholesterol diet and exercise.  Check lipid panel.   Prediabetes Discussed disease progression and risks Discussed diet/exercise, weight management and risk modification Check A1C due to borderline diabetes last visit and   Morbid obesity with co morbidities Long discussion about weight loss, diet, and exercise He has worked to get his weight back down, he will continue to eat more at home and cutting back on carbs/portions Discussed benefits of intermittent fasting and how to incorporate He has been declining medications, however if new diabetes receptive to metformin and ozempic Will follow up in 3 months  Vitamin D Def/ osteoporosis prevention At goal at recent check; continue to recommend supplementation  Defer vitamin D level  Depression/anxiety Increased stress and anxiety, denies depression though I suspect at least some mild; he strongly prefers to avoid medications; receptive to discussions of stress reduction, lighten work load/mental load, counseling. Given instructions on how to initiate.  Lifestyle discussed: diet/exerise, sleep hygiene, stress management, hydration  Recurrent DVT of RLE Has seen Dr. Alvy Bimler due to recurrent x 2, possible thrombophlebitis with high risk lifestyle Continue on xarelto 20 mg  She is planning 6 month follow up for reevaluation.  Weight loss and lifestyle reviewed  History of adenomatous colon polyp Last colonoscopy in 2009, 8 polyps, at least 1 with adenomatous changes, overdue, has GI referral already in place and states has phone number  to call and schedule.  Emphasized to schedule ASAP, most colon cancers progress due to late dx, time is of essense. He indicates intention to schedule soon.   Continue diet and meds as discussed. Further disposition pending results of labs. Discussed med's effects and SE's.   Over 30 minutes of exam, counseling, chart review, and critical decision making was performed.   Future Appointments  Date Time Provider [place]  12/07/2021 12:40 PM Heath Lark, MD [facility] None  03/10/2022  3:30 PM Liane Comber, NP [facility] None  08/16/2022 10:00 AM Unk Pinto, MD [facility] None    ----------------------------------------------------------------------------------------------------------------------  HPI 71 y.o. male  presents for 3 month follow up on hypertension, cholesterol, prediabetes, obesity, depression, GERD and vitamin D deficiency.   Patient was treated for a RLE DVT in June 2020 on Xarelto x 3 months, had immedicate reoccurrence 07/12/2019, then again a [place] on 05/28/2021 showed with age indeterminate non-occlusive DVT involving the right femoral vein, and right popliteal vein. He was referred to Dr. Alvy Bimler who recommended another 6 months on xarelto, concern for possible thrombophlebitis with high risk for recurrence due to obesity and sedentary lifestyle, planning 6 month follow up.   Wearing compression hose daily. Doing well on xarelto at this time.    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 He admits lots of stress due to mother, work, communication with wife. Denies depression but admits to busy mind and some anxiety. Denies SI. Strongly prefers to avoid meds, receptive to counseling.   BMI is Body mass index is 38.17 kg/m., he has not been working on diet and exercise. Weight is up 13 lb  in the last 3 months. 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 Has been eating out in the evening, verbalizes he needs to eat out less, plans to reduce from 3 meals to 2 meals/day to avoid eating so late in the evening.   Wt Readings from Last 3 Encounters:  11/19/21 266 lb (120.7 kg)  08/14/21 253 lb (114.8 kg)  06/08/21 254 lb 6.4 oz (115.4 kg)   His blood pressure has been controlled at home, today their BP is BP: 136/80  He does not workout but is active outside in garden when weather allows. He denies chest pain, shortness of breath, dizziness.   He is not on cholesterol medication and denies myalgias. His cholesterol is at goal. The cholesterol last visit was:   Lab Results  Component Value Date   CHOL 156 08/14/2021   HDL 40 08/14/2021   LDLCALC 97 08/14/2021   TRIG 93 08/14/2021   CHOLHDL 3.9 08/14/2021    He has been working on diet for prediabetes (never above 6.5%), and denies increased appetite, nausea, paresthesia of the feet, polydipsia, polyuria, visual disturbances and vomiting. Last A1C in the office was:  Lab Results  Component Value Date   HGBA1C 6.4 (H) 08/14/2021    He has CKD IIIa monitored at this office:  Lab Results  Component Value Date   EGFR 47 (L) 08/14/2021   EGFR 55 (L) 05/27/2021   Patient is on Vitamin D supplement and at goal:   Lab Results  Component Value Date   VD25OH 103 (H) 08/14/2021        Current Medications:  Current Outpatient Medications on File Prior to Visit  Medication Sig   B Complex-C (SUPER B COMPLEX PO) Take 1 tablet by mouth daily.   bisoprolol-hydrochlorothiazide (ZIAC) 5-6.25 MG tablet Take  1 tablet  Daily  for BP / Patient knows to take by mouth   Calcium Carbonate Antacid (TUMS PO) Take by mouth.   Cholecalciferol (VITAMIN D-3) 5000 UNITS TABS Take 10,000 Units by mouth daily.   fluticasone (FLONASE) 50 MCG/ACT nasal spray SPRAY 2 SPRAYS INTO EACH NOSTRIL EVERY DAY   loratadine (CLARITIN) 10 MG tablet Take 10 mg by mouth daily.   montelukast  (SINGULAIR) 10 MG tablet TAKE 1 TABLET BY MOUTH DAILY FOR ALLERGIES   rivaroxaban (XARELTO) 20 MG TABS tablet Take 1 tablet Daily to Prevent Blood Clots / Phlebitis   acetaminophen (TYLENOL) 650 MG CR tablet Take 650 mg by mouth every 8 (eight) hours as needed for pain. (Patient not taking: Reported on 11/19/2021)   No current facility-administered medications on file prior to visit.     Allergies:  Allergies  Allergen Reactions   Penicillins     REACTION: unknown     Medical History:  Past Medical History:  Diagnosis Date   Allergic rhinitis    BPH (benign prostatic hyperplasia)    GERD (gastroesophageal reflux disease)    Hypogonadism, male    Kidney stones    Morbid obesity ([place])    Pre-diabetes    Vitamin D deficiency    Family history- Reviewed and unchanged Social history- Reviewed and unchanged   Review of Systems:  Review of Systems  Constitutional:  Negative for malaise/fatigue and weight loss.  HENT:  Negative for hearing loss and tinnitus.   Eyes:  Negative for blurred vision and double vision.  Respiratory:  Negative for cough, shortness of breath and wheezing.  Cardiovascular:  Negative for chest pain, palpitations, orthopnea, claudication and leg swelling.  Gastrointestinal:  Negative for abdominal pain, blood in stool, constipation, diarrhea, heartburn, melena, nausea and vomiting.  Genitourinary: Negative.   Musculoskeletal:  Negative for joint pain and myalgias.  Skin:  Negative for rash.  Neurological:  Negative for dizziness, tingling, sensory change, weakness and headaches.  Endo/Heme/Allergies:  Negative for polydipsia.  Psychiatric/Behavioral: Negative.    All other systems reviewed and are negative.  Physical Exam: BP 136/80    Pulse 64    Temp (!) 97.5 F (36.4 C)    Wt 266 lb (120.7 kg)    SpO2 98%    BMI 38.17 kg/m  Wt Readings from Last 3 Encounters:  11/19/21 266 lb (120.7 kg)  08/14/21 253 lb (114.8 kg)  06/08/21 254 lb 6.4 oz (115.4  kg)   General Appearance: Well nourished, in no apparent distress. Eyes: PERRLA, EOMs, conjunctiva no swelling or erythema Sinuses: No Frontal/maxillary tenderness ENT/Mouth: Ext aud canals clear, TMs without erythema, bulging. Mask in place; oral exam deferred. Hearing normal.  Neck: Supple, thyroid normal.  Respiratory: Respiratory effort normal, BS equal bilaterally without rales, rhonchi, wheezing or stridor.  Cardio: RRR with no MRGs. Brisk peripheral pulses, scant pitting edema (not wearing compression hose at time of exam) Abdomen: Soft, obese abdomen, + BS.  Non tender, no guarding, rebound, hernias, masses. Lymphatics: Non tender without lymphadenopathy.  Musculoskeletal: Full ROM, 5/5 strength, Normal gait Skin: Warm, dry without rashes, lesions, ecchymosis.  Neuro: Cranial nerves intact. No cerebellar symptoms.  Psych: Awake and oriented X 3, normal affect, Insight and Judgment appropriate.    Izora Ribas, NP 12:27 PM [facility]

## 2021-11-19 ENCOUNTER — Encounter: Payer: Self-pay | Admitting: Adult Health

## 2021-11-19 ENCOUNTER — Other Ambulatory Visit: Payer: Self-pay

## 2021-11-19 ENCOUNTER — Ambulatory Visit (INDEPENDENT_AMBULATORY_CARE_PROVIDER_SITE_OTHER): Payer: BC Managed Care – PPO | Admitting: Adult Health

## 2021-11-19 VITALS — BP 136/80 | HR 64 | Temp 97.5°F | Wt 266.0 lb

## 2021-11-19 DIAGNOSIS — I10 Essential (primary) hypertension: Secondary | ICD-10-CM

## 2021-11-19 DIAGNOSIS — Z86010 Personal history of colonic polyps: Secondary | ICD-10-CM

## 2021-11-19 DIAGNOSIS — Z6835 Body mass index (BMI) 35.0-35.9, adult: Secondary | ICD-10-CM

## 2021-11-19 DIAGNOSIS — F3341 Major depressive disorder, recurrent, in partial remission: Secondary | ICD-10-CM

## 2021-11-19 DIAGNOSIS — I82401 Acute embolism and thrombosis of unspecified deep veins of right lower extremity: Secondary | ICD-10-CM | POA: Diagnosis not present

## 2021-11-19 DIAGNOSIS — E782 Mixed hyperlipidemia: Secondary | ICD-10-CM | POA: Diagnosis not present

## 2021-11-19 DIAGNOSIS — Z79899 Other long term (current) drug therapy: Secondary | ICD-10-CM | POA: Diagnosis not present

## 2021-11-19 DIAGNOSIS — R7303 Prediabetes: Secondary | ICD-10-CM

## 2021-11-19 DIAGNOSIS — E559 Vitamin D deficiency, unspecified: Secondary | ICD-10-CM

## 2021-11-19 DIAGNOSIS — N183 Chronic kidney disease, stage 3 unspecified: Secondary | ICD-10-CM | POA: Diagnosis not present

## 2021-11-19 DIAGNOSIS — R7309 Other abnormal glucose: Secondary | ICD-10-CM | POA: Diagnosis not present

## 2021-11-19 LAB — COMPLETE METABOLIC PANEL WITH GFR
AG Ratio: 1.9 (calc) (ref 1.0–2.5)
ALT: 18 U/L (ref 9–46)
AST: 15 U/L (ref 10–35)
Albumin: 4.4 g/dL (ref 3.6–5.1)
Alkaline phosphatase (APISO): 49 U/L (ref 35–144)
BUN/Creatinine Ratio: 14 (calc) (ref 6–22)
BUN: 23 mg/dL (ref 7–25)
CO2: 32 mmol/L (ref 20–32)
Calcium: 10.5 mg/dL — ABNORMAL HIGH (ref 8.6–10.3)
Chloride: 101 mmol/L (ref 98–110)
Creat: 1.64 mg/dL — ABNORMAL HIGH (ref 0.70–1.28)
Globulin: 2.3 g/dL (calc) (ref 1.9–3.7)
Glucose, Bld: 131 mg/dL — ABNORMAL HIGH (ref 65–99)
Potassium: 4 mmol/L (ref 3.5–5.3)
Sodium: 141 mmol/L (ref 135–146)
Total Bilirubin: 0.9 mg/dL (ref 0.2–1.2)
Total Protein: 6.7 g/dL (ref 6.1–8.1)
eGFR: 45 mL/min/{1.73_m2} — ABNORMAL LOW (ref >=60)

## 2021-11-19 LAB — CBC WITH DIFFERENTIAL/PLATELET
Absolute Monocytes: 697 cells/uL (ref 200–950)
Basophils Absolute: 28 cells/uL (ref 0–200)
Basophils Relative: 0.4 %
Eosinophils Absolute: 173 cells/uL (ref 15–500)
Eosinophils Relative: 2.5 %
HCT: 45.1 % (ref 38.5–50.0)
Hemoglobin: 15.6 g/dL (ref 13.2–17.1)
Lymphs Abs: 1835 cells/uL (ref 850–3900)
MCH: 31.8 pg (ref 27.0–33.0)
MCHC: 34.6 g/dL (ref 32.0–36.0)
MCV: 91.9 fL (ref 80.0–100.0)
MPV: 10.3 fL (ref 7.5–12.5)
Monocytes Relative: 10.1 %
Neutro Abs: 4168 cells/uL (ref 1500–7800)
Neutrophils Relative %: 60.4 %
Platelets: 204 10*3/uL (ref 140–400)
RBC: 4.91 10*6/uL (ref 4.20–5.80)
RDW: 12.7 % (ref 11.0–15.0)
Total Lymphocyte: 26.6 %
WBC: 6.9 10*3/uL (ref 3.8–10.8)

## 2021-11-19 LAB — TSH: TSH: 2.02 mIU/L (ref 0.40–4.50)

## 2021-11-19 LAB — LIPID PANEL
Cholesterol: 164 mg/dL (ref <200)
HDL: 45 mg/dL (ref >=40)
LDL Cholesterol (Calc): 92 mg/dL (calc)
Non-HDL Cholesterol (Calc): 119 mg/dL (calc) (ref <130)
Total CHOL/HDL Ratio: 3.6 (calc) (ref <5.0)
Triglycerides: 177 mg/dL — ABNORMAL HIGH (ref <150)

## 2021-11-19 LAB — HEMOGLOBIN A1C
Hgb A1c MFr Bld: 6.3 % of total Hgb — ABNORMAL HIGH (ref <5.7)
Mean Plasma Glucose: 134 mg/dL
eAG (mmol/L): 7.4 mmol/L

## 2021-11-19 LAB — MAGNESIUM: Magnesium: 2 mg/dL (ref 1.5–2.5)

## 2021-11-19 NOTE — Patient Instructions (Signed)
Counseling services  I suggest calling your insurance and finding out who is in your network and THEN calling those people or looking them up on google.   I'm a big fan of Cognitive Behavioral Therapy, look this up on You tube or check with the therapist you see if they are certified.  This form of therapy helps to teach you skills to better handle with current situation that are causing anxiety or depression.   There are some great apps too Check out Latty, give thanks app.  Meditations apps are great like headspace.          Metformin Extended-Release Tablets What is this medication? METFORMIN (met FOR min) treats type 2 diabetes. It controls blood sugar (glucose) and helps your body use insulin effectively. This medication is often combined with changes to diet and exercise. This medicine may be used for other purposes; ask your health care provider or pharmacist if you have questions. COMMON BRAND NAME(S): Fortamet, Glucophage XR, Glumetza What should I tell my care team before I take this medication? They need to know if you have any of these conditions: Anemia Dehydration Heart disease If you often drink alcohol Kidney disease Liver disease Polycystic ovary syndrome Serious infection or injury Vomiting An unusual or allergic reaction to metformin, other medications, foods, dyes, or preservatives Pregnant or trying to get pregnant Breast-feeding How should I use this medication? Take this medication by mouth with a glass of water. Follow the directions on the prescription label. Take this medication with food. Take your medication at regular intervals. Do not take your medication more often than directed. Do not stop taking except on your care team's advice. Talk to your care team about the use of this medication in children. Special care may be needed. Overdosage: If you think you have taken too much of this medicine contact a poison control center or emergency room at  once. NOTE: This medicine is only for you. Do not share this medicine with others. What if I miss a dose? If you miss a dose, take it as soon as you can. If it is almost time for your next dose, take only that dose. Do not take double or extra doses. What may interact with this medication? Do not take this medication with any of the following: Certain contrast medications given before X-rays, CT scans, MRI, or other procedures Dofetilide This medication may also interact with the following: Acetazolamide Alcohol Certain antivirals for HIV or hepatitis Certain medications for blood pressure, heart disease, irregular heart beat Cimetidine Dichlorphenamide Digoxin Diuretics Estrogens, progestins, or birth control pills Glycopyrrolate Isoniazid Lamotrigine Memantine Methazolamide Metoclopramide Midodrine Niacin Phenothiazines like chlorpromazine, mesoridazine, prochlorperazine, thioridazine Phenytoin Ranolazine Steroid medications like prednisone or cortisone Stimulant medications for attention disorders, weight loss, or to stay awake Thyroid medications Topiramate Trospium Vandetanib Zonisamide This list may not describe all possible interactions. Give your health care provider a list of all the medicines, herbs, non-prescription drugs, or dietary supplements you use. Also tell them if you smoke, drink alcohol, or use illegal drugs. Some items may interact with your medicine. What should I watch for while using this medication? Visit your care team for regular checks on your progress. A test called the HbA1C (A1C) will be monitored. This is a simple blood test. It measures your blood sugar control over the last 2 to 3 months. You will receive this test every 3 to 6 months. Using this medication with insulin or a sulfonylurea may increase your risk of  hypoglycemia. Learn how to check your blood sugar. Learn the symptoms of low and high blood sugar and how to manage them. Always  carry a quick-source of sugar with you in case you have symptoms of low blood sugar. Examples include hard sugar candy or glucose tablets. Make sure others know that you can choke if you eat or drink when you develop serious symptoms of low blood sugar, such as seizures or unconsciousness. They must get medical help at once. Tell your care team if you have high blood sugar. You might need to change the dose of your medication. If you are sick or exercising more than usual, you might need to change the dose of your medication. Do not skip meals. Ask your care team if you should avoid alcohol. Many nonprescription cough and cold products contain sugar or alcohol. These can affect blood sugar. This medication may cause ovulation in premenopausal women who do not have regular monthly periods. This may increase your chances of becoming pregnant. You should not take this medication if you become pregnant or think you may be pregnant. Talk with your care team about your birth control options while taking this medication. Contact your care team right away if you think you are pregnant. The tablet shell for some brands of this medication does not dissolve. This is normal. The tablet shell may appear whole in the stool. This is not a cause for concern. If you are going to need surgery, an MRI, CT scan, or other procedure, tell your health care provider that you are taking this medication. You may need to stop taking this medication before the procedure. Wear a medical ID bracelet or chain, and carry a card that describes your disease and details of your medication and dosage times. This medication may cause a decrease in folic acid and vitamin B12. You should make sure that you get enough vitamins while you are taking this medication. Discuss the foods you eat and the vitamins you take with your care team. What side effects may I notice from receiving this medication? Side effects that you should report to your care  team as soon as possible: Allergic reactions--skin rash, itching, hives, swelling of the face, lips, tongue, or throat High lactic acid level--muscle pain or cramps, stomach pain, trouble breathing, general discomfort or fatigue Low vitamin B12 level--pain, tingling, or numbness in the hands or feet, muscle weakness, dizziness, confusion, difficulty concentrating Side effects that usually do not require medical attention (report to your care team if they continue or are bothersome): Diarrhea Gas Headache Metallic taste in mouth Nausea This list may not describe all possible side effects. Call your doctor for medical advice about side effects. You may report side effects to FDA at 1-800-FDA-1088. Where should I keep my medication? Keep out of the reach of children and pets. Store at room temperature between 15 and 30 degrees C (59 and 86 degrees F). Protect from light. Get rid of any unused medication after the expiration date. To get rid of medications that are no longer needed or expired: Take the medication to a medication take-back program. Check with your pharmacy or law enforcement to find a location. If you cannot return the medication, check the label or package insert to see if the medication should be thrown out in the garbage or flushed down the toilet. If you are not sure, ask your care team. If it is safe to put in the trash, empty the medication out of the container. Mix  the medication with cat litter, dirt, coffee grounds, or other unwanted substance. Seal the mixture in a bag or container. Put it in the trash. NOTE: This sheet is a summary. It may not cover all possible information. If you have questions about this medicine, talk to your doctor, pharmacist, or health care provider.  2022 Elsevier/Gold Standard (2020-08-29 00:00:00)    Semaglutide Injection What is this medication? SEMAGLUTIDE (SEM a GLOO tide) treats type 2 diabetes. It works by increasing insulin levels in  your body, which decreases your blood sugar (glucose). It also reduces the amount of sugar released into the blood and slows down your digestion. It can also be used to lower the risk of heart attack and stroke in people with type 2 diabetes. Changes to diet and exercise are often combined with this medication. This medicine may be used for other purposes; ask your health care provider or pharmacist if you have questions. COMMON BRAND NAME(S): OZEMPIC What should I tell my care team before I take this medication? They need to know if you have any of these conditions: Endocrine tumors (MEN 2) or if someone in your family had these tumors Eye disease, vision problems History of pancreatitis Kidney disease Stomach problems Thyroid cancer or if someone in your family had thyroid cancer An unusual or allergic reaction to semaglutide, other medications, foods, dyes, or preservatives Pregnant or trying to get pregnant Breast-feeding How should I use this medication? This medication is for injection under the skin of your upper leg (thigh), stomach area, or upper arm. It is given once every week (every 7 days). You will be taught how to prepare and give this medication. Use exactly as directed. Take your medication at regular intervals. Do not take it more often than directed. If you use this medication with insulin, you should inject this medication and the insulin separately. Do not mix them together. Do not give the injections right next to each other. Change (rotate) injection sites with each injection. It is important that you put your used needles and syringes in a special sharps container. Do not put them in a trash can. If you do not have a sharps container, call your pharmacist or care team to get one. A special MedGuide will be given to you by the pharmacist with each prescription and refill. Be sure to read this information carefully each time. This medication comes with INSTRUCTIONS FOR USE.  Ask your pharmacist for directions on how to use this medication. Read the information carefully. Talk to your pharmacist or care team if you have questions. Talk to your care team about the use of this medication in children. Special care may be needed. Overdosage: If you think you have taken too much of this medicine contact a poison control center or emergency room at once. NOTE: This medicine is only for you. Do not share this medicine with others. What if I miss a dose? If you miss a dose, take it as soon as you can within 5 days after the missed dose. Then take your next dose at your regular weekly time. If it has been longer than 5 days after the missed dose, do not take the missed dose. Take the next dose at your regular time. Do not take double or extra doses. If you have questions about a missed dose, contact your care team for advice. What may interact with this medication? Other medications for diabetes Many medications may cause changes in blood sugar, these include: Alcohol containing  beverages Antiviral medications for HIV or AIDS Aspirin and aspirin-like medications Certain medications for blood pressure, heart disease, irregular heart beat Chromium Diuretics Male hormones, such as estrogens or progestins, birth control pills Fenofibrate Gemfibrozil Isoniazid Lanreotide Male hormones or anabolic steroids MAOIs like Carbex, Eldepryl, Marplan, Nardil, and Parnate Medications for weight loss Medications for allergies, asthma, cold, or cough Medications for depression, anxiety, or psychotic disturbances Niacin Nicotine NSAIDs, medications for pain and inflammation, like ibuprofen or naproxen Octreotide Pasireotide Pentamidine Phenytoin Probenecid Quinolone antibiotics such as ciprofloxacin, levofloxacin, ofloxacin Some herbal dietary supplements Steroid medications such as prednisone or cortisone Sulfamethoxazole; trimethoprim Thyroid hormones Some medications can  hide the warning symptoms of low blood sugar (hypoglycemia). You may need to monitor your blood sugar more closely if you are taking one of these medications. These include: Beta-blockers, often used for high blood pressure or heart problems (examples include atenolol, metoprolol, propranolol) Clonidine Guanethidine Reserpine This list may not describe all possible interactions. Give your health care provider a list of all the medicines, herbs, non-prescription drugs, or dietary supplements you use. Also tell them if you smoke, drink alcohol, or use illegal drugs. Some items may interact with your medicine. What should I watch for while using this medication? Visit your care team for regular checks on your progress. Drink plenty of fluids while taking this medication. Check with your care team if you get an attack of severe diarrhea, nausea, and vomiting. The loss of too much body fluid can make it dangerous for you to take this medication. A test called the HbA1C (A1C) will be monitored. This is a simple blood test. It measures your blood sugar control over the last 2 to 3 months. You will receive this test every 3 to 6 months. Learn how to check your blood sugar. Learn the symptoms of low and high blood sugar and how to manage them. Always carry a quick-source of sugar with you in case you have symptoms of low blood sugar. Examples include hard sugar candy or glucose tablets. Make sure others know that you can choke if you eat or drink when you develop serious symptoms of low blood sugar, such as seizures or unconsciousness. They must get medical help at once. Tell your care team if you have high blood sugar. You might need to change the dose of your medication. If you are sick or exercising more than usual, you might need to change the dose of your medication. Do not skip meals. Ask your care team if you should avoid alcohol. Many nonprescription cough and cold products contain sugar or alcohol.  These can affect blood sugar. Pens should never be shared. Even if the needle is changed, sharing may result in passing of viruses like hepatitis or HIV. Wear a medical ID bracelet or chain, and carry a card that describes your disease and details of your medication and dosage times. Do not become pregnant while taking this medication. Women should inform their care team if they wish to become pregnant or think they might be pregnant. There is a potential for serious side effects to an unborn child. Talk to your care team for more information. What side effects may I notice from receiving this medication? Side effects that you should report to your care team as soon as possible: Allergic reactions--skin rash, itching, hives, swelling of the face, lips, tongue, or throat Change in vision Dehydration--increased thirst, dry mouth, feeling faint or lightheaded, headache, dark yellow or brown urine Gallbladder problems--severe stomach pain, nausea,  vomiting, fever Heart palpitations--rapid, pounding, or irregular heartbeat Kidney injury--decrease in the amount of urine, swelling of the ankles, hands, or feet Pancreatitis--severe stomach pain that spreads to your back or gets worse after eating or when touched, fever, nausea, vomiting Thyroid cancer--new mass or lump in the neck, pain or trouble swallowing, trouble breathing, hoarseness Side effects that usually do not require medical attention (report to your care team if they continue or are bothersome): Diarrhea Loss of appetite Nausea Stomach pain Vomiting This list may not describe all possible side effects. Call your doctor for medical advice about side effects. You may report side effects to FDA at 1-800-FDA-1088. Where should I keep my medication? Keep out of the reach of children. Store unopened pens in a refrigerator between 2 and 8 degrees C (36 and 46 degrees F). Do not freeze. Protect from light and heat. After you first use the pen, it  can be stored for 56 days at room temperature between 15 and 30 degrees C (59 and 86 degrees F) or in a refrigerator. Throw away your used pen after 56 days or after the expiration date, whichever comes first. Do not store your pen with the needle attached. If the needle is left on, medication may leak from the pen. NOTE: This sheet is a summary. It may not cover all possible information. If you have questions about this medicine, talk to your doctor, pharmacist, or health care provider.  2022 Elsevier/Gold Standard (2020-12-18 00:00:00)

## 2021-11-20 ENCOUNTER — Ambulatory Visit
Admission: RE | Admit: 2021-11-20 | Discharge: 2021-11-20 | Disposition: A | Discharge status: Home / Self Care | Payer: BC Managed Care – PPO | Source: Ambulatory Visit | Attending: Adult Health | Admitting: Adult Health

## 2021-11-20 ENCOUNTER — Other Ambulatory Visit: Payer: Self-pay | Admitting: Adult Health

## 2021-11-20 ENCOUNTER — Encounter: Payer: Self-pay | Admitting: Adult Health

## 2021-11-20 DIAGNOSIS — N289 Disorder of kidney and ureter, unspecified: Secondary | ICD-10-CM

## 2021-11-20 DIAGNOSIS — N200 Calculus of kidney: Secondary | ICD-10-CM | POA: Insufficient documentation

## 2021-11-25 NOTE — Progress Notes (Signed)
Lvm to call back and make appt per Morrie Sheldon

## 2021-12-01 ENCOUNTER — Other Ambulatory Visit: Payer: Self-pay | Admitting: Internal Medicine

## 2021-12-07 ENCOUNTER — Inpatient Hospital Stay: Payer: BC Managed Care – PPO | Admitting: Hematology and Oncology

## 2021-12-07 ENCOUNTER — Telehealth: Payer: Self-pay

## 2021-12-07 NOTE — Telephone Encounter (Signed)
Received a message from the after hours service to cancel today's appt. ?Appt canceled and left a message for him to call the office back to reschedule. ?

## 2021-12-23 ENCOUNTER — Ambulatory Visit (INDEPENDENT_AMBULATORY_CARE_PROVIDER_SITE_OTHER): Payer: BC Managed Care – PPO | Admitting: Adult Health

## 2021-12-23 ENCOUNTER — Encounter: Payer: Self-pay | Admitting: Adult Health

## 2021-12-23 VITALS — BP 138/86 | HR 64 | Temp 97.7°F | Wt 248.0 lb

## 2021-12-23 DIAGNOSIS — N289 Disorder of kidney and ureter, unspecified: Secondary | ICD-10-CM

## 2021-12-23 DIAGNOSIS — Z6835 Body mass index (BMI) 35.0-35.9, adult: Secondary | ICD-10-CM | POA: Diagnosis not present

## 2021-12-23 NOTE — Progress Notes (Signed)
?FOLLOW UP ? ?Assessment and Plan:  ? ?Morbid obesity with co morbidities ?Commended excellent progress of 18 lb weight loss by lifestyle  ?Long discussion about weight loss, diet, and exercise ?No indication for need for medications to support ?Continue close follow up for progress monitoring  ?Will follow up in 3 months ? ?Deterioration in renal functions ?Lifestyle reviewed; no clear etiology; has increased fluid intake and reduced caffeine/alcohol; recheck BMP/GFR ? ? ?Continue diet and meds as discussed. Further disposition pending results of labs. Discussed med's effects and SE's.   ?Over 30 minutes of exam, counseling, chart review, and critical decision making was performed.  ? ?Future Appointments  ?Date Time Provider [place]  ?03/10/2022  3:30 PM Liane Comber, NP [facility] None  ?08/16/2022 10:00 AM Unk Pinto, MD [facility] None  ? ?---------------------------------------------------------------------------------------------------------------------- ? ?HPI ?71 y.o. male  presents for 1 month follow up on morbid obesity and renal functions.  ? ?BMI is Body mass index is 35.58 kg/m?.,  last visit weight was up to highest ever, 266 lb. Declined medication to help with weight loss at that time.  ?he has been working on diet and exercise, has switched to mostly fresh fruit/veggies and grilled chicken in small portions. He is making an effort to eat very slowly, taking his time. Keeping fresh fruits and veggies to snack on. Using [employer] killer bread. Has cut out all desserts.  ?He reports has started using elliptical, will increase resistance, up to an hour.  ?He is praying a lot, feels this is helping him stick to his plan ?Pushing water intake, 6-8 bottles per day, has reduced coffee and cut out alcohol.  ?Will not eat if he takes his mom out to [place], will just pick up a fruit bowl  ?He is down 18 lb since the last visit, feeling very motivated and optimistic.  ?Wt Readings from  Last 3 Encounters:  ?12/23/21 248 lb (112.5 kg)  ?11/19/21 266 lb (120.7 kg)  ?08/14/21 253 lb (114.8 kg)  ? ?His blood pressure has been controlled at home, today their BP is BP: 140/90 ? He does workout. He denies chest pain, shortness of breath, dizziness. ? ? He has been working on diet for prediabetes (never above 6.5%), and denies increased appetite, nausea, paresthesia of the feet, polydipsia, polyuria, visual disturbances and vomiting. Last A1C in the office was:  ?Lab Results  ?Component Value Date  ? HGBA1C 6.3 (H) 11/19/2021  ? ? He has CKD IIIa monitored at this office, trending down last few checks, benign renal US 11/20/2021, denies NSAID use, has been drinking 6-8 bottles of water daily since last check:  ?Lab Results  ?Component Value Date  ? EGFR 45 (L) 11/19/2021  ? EGFR 47 (L) 08/14/2021  ? EGFR 55 (L) 05/27/2021  ? ? ? ?Current Medications:  ?Current Outpatient Medications on File Prior to Visit  ?Medication Sig  ? B Complex-C (SUPER B COMPLEX PO) Take 1 tablet by mouth daily.  ? bisoprolol-hydrochlorothiazide (ZIAC) 5-6.25 MG tablet Take  1 tablet  Daily  for BP / Patient knows to take by mouth  ? Calcium Carbonate Antacid (TUMS PO) Take by mouth.  ? Cholecalciferol (VITAMIN D-3) 5000 UNITS TABS Take 10,000 Units by mouth daily.  ? fluticasone (FLONASE) 50 MCG/ACT nasal spray SPRAY 2 SPRAYS INTO EACH NOSTRIL EVERY DAY  ? loratadine (CLARITIN) 10 MG tablet Take 10 mg by mouth daily.  ? montelukast (SINGULAIR) 10 MG tablet TAKE 1 TABLET BY MOUTH DAILY FOR ALLERGIES  ?  XARELTO 20 MG TABS tablet TAKE 1 TABLET DAILY TO PREVENT BLOOD CLOTS / PHLEBITIS  ? acetaminophen (TYLENOL) 650 MG CR tablet Take 650 mg by mouth every 8 (eight) hours as needed for pain. (Patient not taking: Reported on 11/19/2021)  ? ?No current facility-administered medications on file prior to visit.  ? ? ? ?Allergies:  ?Allergies  ?Allergen Reactions  ? Penicillins   ?  REACTION: unknown  ?  ? ?Medical History:  ?Past Medical  History:  ?Diagnosis Date  ? Allergic rhinitis   ? BPH (benign prostatic hyperplasia)   ? GERD (gastroesophageal reflux disease)   ? Hypogonadism, male   ? Kidney stones   ? Morbid obesity ([place])   ? Pre-diabetes   ? Vitamin D deficiency   ? ?Family history- Reviewed and unchanged ?Social history- Reviewed and unchanged ? ? ?Review of Systems:  ?Review of Systems  ?Constitutional:  Negative for malaise/fatigue and weight loss.  ?HENT:  Negative for hearing loss and tinnitus.   ?Eyes:  Negative for blurred vision and double vision.  ?Respiratory:  Negative for cough, shortness of breath and wheezing.   ?Cardiovascular:  Negative for chest pain, palpitations, orthopnea, claudication and leg swelling.  ?Gastrointestinal:  Negative for abdominal pain, blood in stool, constipation, diarrhea, heartburn, melena, nausea and vomiting.  ?Genitourinary: Negative.   ?Musculoskeletal:  Negative for joint pain and myalgias.  ?Skin:  Negative for rash.  ?Neurological:  Negative for dizziness, tingling, sensory change, weakness and headaches.  ?Endo/Heme/Allergies:  Negative for polydipsia.  ?Psychiatric/Behavioral: Negative.    ?All other systems reviewed and are negative. ? ?Physical Exam: ?BP 140/90   Pulse 64   Temp 97.7 ?F (36.5 ?C)   Wt 248 lb (112.5 kg)   SpO2 99%   BMI 35.58 kg/m?  ?Wt Readings from Last 3 Encounters:  ?12/23/21 248 lb (112.5 kg)  ?11/19/21 266 lb (120.7 kg)  ?08/14/21 253 lb (114.8 kg)  ? ?General Appearance: Well nourished, in no apparent distress. ?Eyes: PERRLA, EOMs, conjunctiva no swelling or erythema ?Sinuses: No Frontal/maxillary tenderness ?ENT/Mouth: Ext aud canals clear, TMs without erythema, bulging. Mask in place; oral exam deferred. Hearing normal.  ?Neck: Supple, thyroid normal.  ?Respiratory: Respiratory effort normal, BS equal bilaterally without rales, rhonchi, wheezing or stridor.  ?Cardio: RRR with no MRGs. Brisk peripheral pulses, scant pitting edema (not wearing compression hose at  time of exam) ?Abdomen: Soft, obese abdomen, + BS.  Non tender, no guarding, rebound, + non-tender umbilical hernia, masses. ?Lymphatics: Non tender without lymphadenopathy.  ?Musculoskeletal: Full ROM, 5/5 strength, Normal gait ?Skin: Warm, dry without rashes, lesions, ecchymosis.  ?Neuro: Cranial nerves intact. No cerebellar symptoms.  ?Psych: Awake and oriented X 3, normal affect, Insight and Judgment appropriate.  ? ? ?Izora Ribas, NP ?2:35 PM ?[facility] ? ?

## 2021-12-24 LAB — BASIC METABOLIC PANEL WITH GFR
BUN/Creatinine Ratio: 14 (calc) (ref 6–22)
BUN: 18 mg/dL (ref 7–25)
CO2: 26 mmol/L (ref 20–32)
Calcium: 9.1 mg/dL (ref 8.6–10.3)
Chloride: 104 mmol/L (ref 98–110)
Creat: 1.3 mg/dL — ABNORMAL HIGH (ref 0.70–1.28)
Glucose, Bld: 96 mg/dL (ref 65–99)
Potassium: 4 mmol/L (ref 3.5–5.3)
Sodium: 140 mmol/L (ref 135–146)
eGFR: 59 mL/min/{1.73_m2} — ABNORMAL LOW (ref >=60)

## 2022-03-01 ENCOUNTER — Other Ambulatory Visit: Payer: Self-pay | Admitting: Adult Health

## 2022-03-10 ENCOUNTER — Encounter: Payer: Self-pay | Admitting: Adult Health

## 2022-03-10 ENCOUNTER — Ambulatory Visit: Payer: BC Managed Care – PPO | Admitting: Adult Health

## 2022-03-10 VITALS — BP 124/78 | HR 71 | Temp 96.9°F | Wt 249.2 lb

## 2022-03-10 DIAGNOSIS — Z79899 Other long term (current) drug therapy: Secondary | ICD-10-CM

## 2022-03-10 DIAGNOSIS — E782 Mixed hyperlipidemia: Secondary | ICD-10-CM

## 2022-03-10 DIAGNOSIS — I10 Essential (primary) hypertension: Secondary | ICD-10-CM | POA: Diagnosis not present

## 2022-03-10 DIAGNOSIS — N183 Chronic kidney disease, stage 3 unspecified: Secondary | ICD-10-CM | POA: Diagnosis not present

## 2022-03-10 DIAGNOSIS — E559 Vitamin D deficiency, unspecified: Secondary | ICD-10-CM

## 2022-03-10 DIAGNOSIS — R7309 Other abnormal glucose: Secondary | ICD-10-CM

## 2022-03-10 DIAGNOSIS — I82401 Acute embolism and thrombosis of unspecified deep veins of right lower extremity: Secondary | ICD-10-CM

## 2022-03-10 DIAGNOSIS — F3341 Major depressive disorder, recurrent, in partial remission: Secondary | ICD-10-CM

## 2022-03-10 NOTE — Progress Notes (Signed)
FOLLOW UP  Assessment and Plan:   Hypertension Fairly controlled with current medications  Monitor blood pressure at home; patient to call if consistently greater than 130/80 Continue DASH diet.   Reminder to go to the ER if any CP, SOB, nausea, dizziness, severe HA, changes vision/speech, left arm numbness and tingling and jaw pain.  Cholesterol Controlled by lifestyle modification Continue low cholesterol diet and exercise.  Check lipid panel.   Prediabetes Excellent progress with weight loss -  Discussed disease progression and risks Discussed diet/exercise, weight management and risk modification Defer A1C to next OV due to insurance/cost  Morbid obesity with co morbidities Excellent progress with lifestyle changes  Long discussion about weight loss, diet, and exercise He has worked to get his weight back down, he will continue to eat more at home and cutting back on carbs/portions, restart elliptical Will follow up in 3 months  Vitamin D Def/ osteoporosis prevention At goal at recent check; continue to recommend supplementation  Defer vitamin D level  Depression/anxiety Ongoing stressors; prefers to avoid medications; he feels doing better with lifestyle and prayer. 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 Alvy Bimler due to recurrent x 2, possible thrombophlebitis with high risk lifestyle Continue on xarelto 20 mg  She is planning 6 month follow up for reevaluation.  Weight loss and lifestyle reviewed    Continue diet and meds as discussed. Further disposition pending results of labs. Discussed med's effects and SE's.   Over 30 minutes of exam, counseling, chart review, and critical decision making was performed.   Future Appointments  Date Time Provider [place]  08/16/2022 10:00 AM Unk Pinto, MD [facility] None     ----------------------------------------------------------------------------------------------------------------------  HPI 71 y.o. male  presents for 3 month follow up on hypertension, cholesterol, prediabetes, obesity, depression, GERD and vitamin D deficiency.   Patient was treated for a RLE DVT in June 2020 on Xarelto x 3 months, had immedicate reoccurrence 07/12/2019, then again a [place] on 05/28/2021 showed with age indeterminate non-occlusive DVT involving the right femoral vein, and right popliteal vein. He was referred to Dr. Alvy Bimler who recommended another 6 months on xarelto, concern for possible thrombophlebitis with high risk for recurrence due to obesity and sedentary lifestyle, planning 6 month follow up. Wearing compression hose daily. Doing well on xarelto at this time.    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 He admits lots of stress due to mother, work, communication with wife. Denies depression, reports doing better with lots of prayer.   BMI is Body mass index is 35.76 kg/m., peak weight 266 lb on 11/19/2021 He has been working on diet/exercise with excellent progress, declined medications, was down 22 lb at last check. He reports was down to 239 lb at home, but mom fell/broker her hip and has regained some while in the hospital/went on vacation last week, plans to restart.  Wt Readings from Last 3 Encounters:  03/10/22 249 lb 3.2 oz (113 kg)  12/23/21 248 lb (112.5 kg)  11/19/21 266 lb (120.7 kg)   His blood pressure has been controlled at home, today their BP is BP: 124/78  He does not workout but is active outside in garden when weather allows. He denies chest pain, shortness of breath, dizziness.   He is not on cholesterol medication and denies myalgias. His cholesterol  is at goal. The cholesterol last visit was:   Lab Results  Component Value Date   CHOL 164 11/19/2021   HDL 45  11/19/2021   LDLCALC 92 11/19/2021   TRIG 177 (H) 11/19/2021   CHOLHDL 3.6 11/19/2021    He has been working on diet for prediabetes (never above 6.5%), and denies increased appetite, nausea, paresthesia of the feet, polydipsia, polyuria, visual disturbances and vomiting. Last A1C in the office was:  Lab Results  Component Value Date   HGBA1C 6.3 (H) 11/19/2021    He has CKD IIIa monitored at this office:  Lab Results  Component Value Date   EGFR 59 (L) 12/23/2021   EGFR 45 (L) 11/19/2021   EGFR 47 (L) 08/14/2021   Patient is on Vitamin D supplement and at goal:   Lab Results  Component Value Date   VD25OH 103 (H) 08/14/2021        Current Medications:  Current Outpatient Medications on File Prior to Visit  Medication Sig   B Complex-C (SUPER B COMPLEX PO) Take 1 tablet by mouth daily.   bisoprolol-hydrochlorothiazide (ZIAC) 5-6.25 MG tablet Take  1 tablet  Daily  for BP / Patient knows to take by mouth   Calcium Carbonate Antacid (TUMS PO) Take by mouth.   Cholecalciferol (VITAMIN D-3) 5000 UNITS TABS Take 10,000 Units by mouth daily.   fluticasone (FLONASE) 50 MCG/ACT nasal spray SPRAY 2 SPRAYS INTO EACH NOSTRIL EVERY DAY   loratadine (CLARITIN) 10 MG tablet Take 10 mg by mouth daily.   montelukast (SINGULAIR) 10 MG tablet TAKE 1 TABLET BY MOUTH DAILY FOR ALLERGIES   XARELTO 20 MG TABS tablet TAKE 1 TABLET DAILY TO PREVENT BLOOD CLOTS / PHLEBITIS   No current facility-administered medications on file prior to visit.     Allergies:  Allergies  Allergen Reactions   Penicillins     REACTION: unknown     Medical History:  Past Medical History:  Diagnosis Date   Allergic rhinitis    BPH (benign prostatic hyperplasia)    GERD (gastroesophageal reflux disease)    Hypogonadism, male    Kidney stones    Morbid obesity ([place])    Pre-diabetes    Vitamin D deficiency    Family history- Reviewed and unchanged Social history- Reviewed and unchanged   Review of  Systems:  Review of Systems  Constitutional:  Negative for malaise/fatigue and weight loss.  HENT:  Negative for hearing loss and tinnitus.   Eyes:  Negative for blurred vision and double vision.  Respiratory:  Negative for cough, shortness of breath and wheezing.   Cardiovascular:  Negative for chest pain, palpitations, orthopnea, claudication and leg swelling.  Gastrointestinal:  Negative for abdominal pain, blood in stool, constipation, diarrhea, heartburn, melena, nausea and vomiting.  Genitourinary: Negative.   Musculoskeletal:  Negative for joint pain and myalgias.  Skin:  Negative for rash.  Neurological:  Negative for dizziness, tingling, sensory change, weakness and headaches.  Endo/Heme/Allergies:  Negative for polydipsia.  Psychiatric/Behavioral: Negative.    All other systems reviewed and are negative.   Physical Exam: BP 124/78   Pulse 71   Temp (!) 96.9 F (36.1 C)   Wt 249 lb 3.2 oz (113 kg)   SpO2 98%   BMI 35.76 kg/m  Wt Readings from Last 3 Encounters:  03/10/22 249 lb 3.2 oz (113 kg)  12/23/21 248 lb (112.5 kg)  11/19/21 266 lb (120.7 kg)   General Appearance: Well nourished, in no apparent distress. Eyes:  PERRLA, EOMs, conjunctiva no swelling or erythema Sinuses: No Frontal/maxillary tenderness ENT/Mouth: Ext aud canals clear, TMs without erythema, bulging. Mask in place; oral exam deferred. Hearing normal.  Neck: Supple, thyroid normal.  Respiratory: Respiratory effort normal, BS equal bilaterally without rales, rhonchi, wheezing or stridor.  Cardio: RRR with no MRGs. Brisk peripheral pulses, scant pitting edema (not wearing compression hose at time of exam) Abdomen: Soft, obese abdomen, + BS.  Non tender, no guarding, rebound, hernias, masses. Lymphatics: Non tender without lymphadenopathy.  Musculoskeletal: Full ROM, 5/5 strength, Normal gait Skin: Warm, dry without rashes, lesions, ecchymosis.  Neuro: Cranial nerves intact. No cerebellar symptoms.   Psych: Awake and oriented X 3, normal affect, Insight and Judgment appropriate.   Izora Ribas, NP 4:33 PM [facility]

## 2022-03-11 LAB — CBC WITH DIFFERENTIAL/PLATELET
Absolute Monocytes: 632 cells/uL (ref 200–950)
Basophils Absolute: 27 cells/uL (ref 0–200)
Basophils Relative: 0.4 %
Eosinophils Absolute: 129 cells/uL (ref 15–500)
Eosinophils Relative: 1.9 %
HCT: 45.8 % (ref 38.5–50.0)
Hemoglobin: 16.1 g/dL (ref 13.2–17.1)
Lymphs Abs: 1952 cells/uL (ref 850–3900)
MCH: 32.5 pg (ref 27.0–33.0)
MCHC: 35.2 g/dL (ref 32.0–36.0)
MCV: 92.3 fL (ref 80.0–100.0)
MPV: 10.3 fL (ref 7.5–12.5)
Monocytes Relative: 9.3 %
Neutro Abs: 4060 cells/uL (ref 1500–7800)
Neutrophils Relative %: 59.7 %
Platelets: 207 10*3/uL (ref 140–400)
RBC: 4.96 10*6/uL (ref 4.20–5.80)
RDW: 12.1 % (ref 11.0–15.0)
Total Lymphocyte: 28.7 %
WBC: 6.8 10*3/uL (ref 3.8–10.8)

## 2022-03-11 LAB — COMPLETE METABOLIC PANEL WITH GFR
AG Ratio: 1.7 (calc) (ref 1.0–2.5)
ALT: 15 U/L (ref 9–46)
AST: 17 U/L (ref 10–35)
Albumin: 4.2 g/dL (ref 3.6–5.1)
Alkaline phosphatase (APISO): 53 U/L (ref 35–144)
BUN/Creatinine Ratio: 14 (calc) (ref 6–22)
BUN: 19 mg/dL (ref 7–25)
CO2: 29 mmol/L (ref 20–32)
Calcium: 9.7 mg/dL (ref 8.6–10.3)
Chloride: 106 mmol/L (ref 98–110)
Creat: 1.34 mg/dL — ABNORMAL HIGH (ref 0.70–1.28)
Globulin: 2.5 g/dL (calc) (ref 1.9–3.7)
Glucose, Bld: 101 mg/dL — ABNORMAL HIGH (ref 65–99)
Potassium: 4 mmol/L (ref 3.5–5.3)
Sodium: 142 mmol/L (ref 135–146)
Total Bilirubin: 0.9 mg/dL (ref 0.2–1.2)
Total Protein: 6.7 g/dL (ref 6.1–8.1)
eGFR: 57 mL/min/{1.73_m2} — ABNORMAL LOW (ref >=60)

## 2022-03-11 LAB — LIPID PANEL
Cholesterol: 172 mg/dL (ref <200)
HDL: 43 mg/dL (ref >=40)
LDL Cholesterol (Calc): 108 mg/dL (calc) — ABNORMAL HIGH
Non-HDL Cholesterol (Calc): 129 mg/dL (calc) (ref <130)
Total CHOL/HDL Ratio: 4 (calc) (ref <5.0)
Triglycerides: 113 mg/dL (ref <150)

## 2022-03-11 LAB — TSH: TSH: 1.63 mIU/L (ref 0.40–4.50)

## 2022-03-11 LAB — MAGNESIUM: Magnesium: 2 mg/dL (ref 1.5–2.5)

## 2022-05-27 ENCOUNTER — Other Ambulatory Visit: Payer: Self-pay | Admitting: Internal Medicine

## 2022-05-27 DIAGNOSIS — I10 Essential (primary) hypertension: Secondary | ICD-10-CM

## 2022-06-06 ENCOUNTER — Other Ambulatory Visit: Payer: Self-pay | Admitting: Internal Medicine

## 2022-06-06 ENCOUNTER — Other Ambulatory Visit: Payer: Self-pay | Admitting: Nurse Practitioner

## 2022-06-06 DIAGNOSIS — I82401 Acute embolism and thrombosis of unspecified deep veins of right lower extremity: Secondary | ICD-10-CM

## 2022-06-06 MED ORDER — RIVAROXABAN 20 MG PO TABS: ORAL_TABLET | ORAL | 0 refills | Status: DC

## 2022-08-15 ENCOUNTER — Encounter: Payer: Self-pay | Admitting: Internal Medicine

## 2022-08-15 NOTE — Patient Instructions (Signed)

## 2022-08-15 NOTE — Progress Notes (Unsigned)
Annual  Screening/Preventative Visit  & Comprehensive Evaluation & Examination   Future Appointments  Date Time Provider Department  08/16/2022 10:00 AM [name], MD [facility]  08/22/2023 10:00 AM [name], MD [facility]             This very nice 71 y.o. MWM  presents for a Screening /Preventative Visit & comprehensive evaluation and management of multiple medical co-morbidities.  Patient has been followed for HTN, HLD, PreDiabetes  and Vitamin D Deficiency.        In June 2020 & Sept 2020 , he was treated for 2 separate episodes of DVT of the RLE and has remained on Xarelto.         HTN predates circa 2001.   Patient's BP has been controlled at home.  Today's BP is at goal altho borderline diastolic  -  136/88. 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  Patient denies any cardiac symptoms as chest pain, palpitations, shortness of breath, dizziness or ankle swelling.       Patient's hyperlipidemia is controlled with diet and medications. Patient denies myalgias or other medication SE's. Last lipids were at goal:  Lab Results  Component Value Date   CHOL 172 03/10/2022   HDL 43 03/10/2022   LDLCALC 108 (H) 03/10/2022   TRIG 113 03/10/2022   CHOLHDL 4.0 03/10/2022         Patient has Morbid Obesity (BMI 36+)  & prediabetes (A1c 6.2% / 2011)     and patient denies reactive hypoglycemic symptoms, visual blurring, diabetic polys or paresthesias. Last A1c was not at goal :    Lab Results  Component Value Date   HGBA1C 6.3 (H) 11/19/2021         Finally, patient has history of Vitamin D Deficiency ("16" /2008) and last vitamin D was at goal :   Lab Results  Component Value Date   VD25OH 103 (H) 08/14/2021       Current Outpatient Medications on File Prior to Visit  Medication Sig   acetaminophen 650 MG CR tablet Take 650 mg every 8 hours as needed for pain.   SUPER B COMPLEX Take 1 tablet daily.   bisoprolol-hctz 5-6.25 MG tablet Take  1 tablet   Daily  for BP    TUMS  Take by mouth.   VITAMIN D  5000 UNITS TABS Take 10,000 Units  daily.   FLONASE  nasal spray 2 SPRAYS EACH NOSTRIL EVERY DAY   loratadine 10 MG tablet Take daily.   montelukast 10 MG tablet TAKE 1 TABLET  DAILY FOR ALLERGIES   XARELTO  20 MG TABS  Take 1 tablet daily.     Allergies  Allergen Reactions   Penicillins     REACTION: unknown     Past Medical History:  Diagnosis Date   Allergic rhinitis    BPH (benign prostatic hyperplasia)    GERD (gastroesophageal reflux disease)    Hypogonadism, male    Kidney stones    Morbid obesity (HCC)    Pre-diabetes    Vitamin D deficiency      Health Maintenance  Topic Date Due   Hepatitis C Screening  Never done   Zoster Vaccines- Shingrix (1 of 2) Never done   COLONOSCOPY  02/15/2018   TETANUS/TDAP  09/06/2024   Pneumonia Vaccine 84+ Years old  Completed   INFLUENZA VACCINE  Completed   COVID-19 Vaccine  Completed   HPV VACCINES  Aged Out  Immunization History  Administered Date(s) Administered   DTaP 01/26/2000   Fluad Quad(high Dose 65+) 07/22/2021   Influenza Whole 06/22/2013   Influenza, High Dose 06/20/2018, 05/13/2019, 06/20/2020   Influenza 07/13/2014, 06/26/2016, 05/13/2019   PFIZER Covid-19 Tri-Sucrose Vacc 01/24/2021   PFIZER SARS-COV-2 Vacc 11/03/2019, 11/24/2019, 06/20/2020   PPD Test 05/29/2014, 07/30/2015   Pfizer Covid-19 Vacc Bivalent Booster  07/22/2021   Pneumococcal -13 05/13/2017   Pneumococcal -23 06/20/2018   Tdap 09/06/2014   Zoster, Live 04/22/2013     Last Colon - 02/16/2008 - Dr Marina Goodell - 10 yr f/u overdue May 2019 - patient aware    Past Surgical History:  Procedure Laterality Date   FLEXIBLE SIGMOIDOSCOPY  1992   KNEE SURGERY Right 1977   TONSILLECTOMY AND ADENOIDECTOMY  1960     Family History  Problem Relation Age of Onset   Cancer Father        lung   Diabetes Father    Diabetes Brother      Social History   Tobacco Use   Smoking status:  Former    Types: Cigarettes    Quit date: 09/28/1975    Years since quitting: 45.9   Smokeless tobacco: Never  Substance Use Topics   Alcohol use: Yes    Comment: occasional   Drug use: No      ROS Constitutional: Denies fever, chills, weight loss/gain, headaches, insomnia,  night sweats or change in appetite. Does c/o fatigue. Eyes: Denies redness, blurred vision, diplopia, discharge, itchy or watery eyes.  ENT: Denies discharge, congestion, post nasal drip, epistaxis, sore throat, earache, hearing loss, dental pain, Tinnitus, Vertigo, Sinus pain or snoring.  Cardio: Denies chest pain, palpitations, irregular heartbeat, syncope, dyspnea, diaphoresis, orthopnea, PND, claudication or edema Respiratory: denies cough, dyspnea, DOE, pleurisy, hoarseness, laryngitis or wheezing.  Gastrointestinal: Denies dysphagia, heartburn, reflux, water brash, pain, cramps, nausea, vomiting, bloating, diarrhea, constipation, hematemesis, melena, hematochezia, jaundice or hemorrhoids Genitourinary: Denies dysuria, frequency, urgency, nocturia, hesitancy, discharge, hematuria or flank pain Musculoskeletal: Denies arthralgia, myalgia, stiffness, Jt. Swelling, pain, limp or strain/sprain. Denies Falls. Skin: Denies puritis, rash, hives, warts, acne, eczema or change in skin lesion Neuro: No weakness, tremor, incoordination, spasms, paresthesia or pain Psychiatric: Denies confusion, memory loss or sensory loss. Denies Depression. Endocrine: Denies change in weight, skin, hair change, nocturia, and paresthesia, diabetic polys, visual blurring or hyper / hypo glycemic episodes.  Heme/Lymph: No excessive bleeding, bruising or enlarged lymph nodes.   Physical Exam  There were no vitals taken for this visit.  General Appearance: Well nourished and well groomed and in no apparent distress.  Eyes: PERRLA, EOMs, conjunctiva no swelling or erythema, normal fundi and vessels. Sinuses: No frontal/maxillary  tenderness ENT/Mouth: EACs patent / TMs  nl. Nares clear without erythema, swelling, mucoid exudates. Oral hygiene is good. No erythema, swelling, or exudate. Tongue normal, non-obstructing. Tonsils not swollen or erythematous. Hearing normal.  Neck: Supple, thyroid not palpable. No bruits, nodes or JVD. Respiratory: Respiratory effort normal.  BS equal and clear bilateral without rales, rhonci, wheezing or stridor. Cardio: Heart sounds are normal with regular rate and rhythm and no murmurs, rubs or gallops. Peripheral pulses are normal and equal bilaterally without edema. No aortic or femoral bruits. Chest: symmetric with normal excursions and percussion.  Abdomen: Soft, with Nl bowel sounds. Nontender, no guarding, rebound, hernias, masses, or organomegaly.  Lymphatics: Non tender without lymphadenopathy.  Musculoskeletal: Full ROM all peripheral extremities, joint stability, 5/5 strength, and normal gait. Skin: Warm and dry without  rashes, lesions, cyanosis, clubbing or  ecchymosis.  Neuro: Cranial nerves intact, reflexes equal bilaterally. Normal muscle tone, no cerebellar symptoms. Sensation intact.  Pysch: Alert and oriented X 3 with normal affect, insight and judgment appropriate.   Assessment and Plan  1. Annual Preventative/Screening Exam    2. Class 2 severe obesity due to excess calories with serious comorbidity  and body mass index (BMI) of 35.0 to 35.9 in adult (HCC)   3. Essential hypertension  - EKG 12-Lead - [place], RETROPERITNL ABD,  LTD - Urinalysis, Routine w reflex microscopic - Microalbumin / creatinine urine ratio - CBC with Differential/Platelet - COMPLETE METABOLIC PANEL WITH GFR - Magnesium - TSH  4. DVT, lower extremity, recurrent, right (HCC)   5. Stage 3 chronic kidney disease, stage 3a CKD (HCC)  - PTH, intact and calcium - COMPLETE METABOLIC PANEL WITH GFR - PTH, intact and calcium  6. Hyperlipidemia, mixed  - EKG 12-Lead - [place], RETROPERITNL ABD,   LTD - Urinalysis, Routine w reflex microscopic - Microalbumin / creatinine urine ratio - Lipid panel - TSH  7. Abnormal glucose  - EKG 12-Lead - [place], RETROPERITNL ABD,  LTD - Hemoglobin A1c - Insulin, random  8. Vitamin D deficiency  - VITAMIN D 25 Hydroxy   9. History of adenomatous polyp of colon  - Ambulatory referral to Gastroenterology  10. BPH with obstruction/lower urinary tract symptoms  - PSA  11. Screening for colorectal cancer  - Ambulatory referral to Gastroenterology  12. Screening for prostate cancer  - PSA  13. Screening for heart disease  - EKG 12-Lead  14. FH: hypertension  - EKG 12-Lead - [place], RETROPERITNL ABD,  LTD  15. Former smoker  - EKG 12-Lead - [place], RETROPERITNL ABD,  LTD  16. Screening for AAA (aortic abdominal aneurysm)  - [place], RETROPERITNL ABD,  LTD  17. Medication management  - Urinalysis, Routine w reflex microscopic - Microalbumin / creatinine urine ratio - CBC with Differential/Platelet - COMPLETE METABOLIC PANEL WITH GFR - Magnesium - Lipid panel - TSH - Hemoglobin A1c - Insulin, random - VITAMIN D 25 Hydroxy  - Ambulatory referral to Gastroenterology           Patient was counseled in prudent diet, weight control to achieve/maintain BMI less than 25, BP monitoring, regular exercise and medications as discussed.  Discussed med effects and SE's. Routine screening labs and tests as requested with regular follow-up as recommended. Over 40 minutes of exam, counseling, chart review and high complex critical decision making was performed   Marinus Maw, MD

## 2022-08-16 ENCOUNTER — Encounter: Payer: Self-pay | Admitting: Internal Medicine

## 2022-08-16 ENCOUNTER — Ambulatory Visit (INDEPENDENT_AMBULATORY_CARE_PROVIDER_SITE_OTHER): Payer: PPO | Admitting: Internal Medicine

## 2022-08-16 VITALS — BP 124/78 | HR 62 | Temp 97.9°F | Resp 16 | Ht 70.0 in | Wt 253.0 lb

## 2022-08-16 DIAGNOSIS — Z0000 Encounter for general adult medical examination without abnormal findings: Secondary | ICD-10-CM | POA: Diagnosis not present

## 2022-08-16 DIAGNOSIS — E782 Mixed hyperlipidemia: Secondary | ICD-10-CM | POA: Diagnosis not present

## 2022-08-16 DIAGNOSIS — R7309 Other abnormal glucose: Secondary | ICD-10-CM

## 2022-08-16 DIAGNOSIS — Z86010 Personal history of colonic polyps: Secondary | ICD-10-CM

## 2022-08-16 DIAGNOSIS — I10 Essential (primary) hypertension: Secondary | ICD-10-CM

## 2022-08-16 DIAGNOSIS — Z8249 Family history of ischemic heart disease and other diseases of the circulatory system: Secondary | ICD-10-CM

## 2022-08-16 DIAGNOSIS — N183 Chronic kidney disease, stage 3 unspecified: Secondary | ICD-10-CM | POA: Diagnosis not present

## 2022-08-16 DIAGNOSIS — I700 Atherosclerosis of aorta: Secondary | ICD-10-CM

## 2022-08-16 DIAGNOSIS — Z136 Encounter for screening for cardiovascular disorders: Secondary | ICD-10-CM

## 2022-08-16 DIAGNOSIS — I82401 Acute embolism and thrombosis of unspecified deep veins of right lower extremity: Secondary | ICD-10-CM

## 2022-08-16 DIAGNOSIS — Z87891 Personal history of nicotine dependence: Secondary | ICD-10-CM

## 2022-08-16 DIAGNOSIS — Z79899 Other long term (current) drug therapy: Secondary | ICD-10-CM | POA: Diagnosis not present

## 2022-08-16 DIAGNOSIS — Z125 Encounter for screening for malignant neoplasm of prostate: Secondary | ICD-10-CM | POA: Diagnosis not present

## 2022-08-16 DIAGNOSIS — E559 Vitamin D deficiency, unspecified: Secondary | ICD-10-CM

## 2022-08-16 DIAGNOSIS — N1831 Chronic kidney disease, stage 3a: Secondary | ICD-10-CM

## 2022-08-16 DIAGNOSIS — N401 Enlarged prostate with lower urinary tract symptoms: Secondary | ICD-10-CM | POA: Diagnosis not present

## 2022-08-16 DIAGNOSIS — Z0001 Encounter for general adult medical examination with abnormal findings: Secondary | ICD-10-CM

## 2022-08-16 DIAGNOSIS — N138 Other obstructive and reflux uropathy: Secondary | ICD-10-CM

## 2022-08-16 DIAGNOSIS — Z1211 Encounter for screening for malignant neoplasm of colon: Secondary | ICD-10-CM

## 2022-08-17 LAB — URINALYSIS, ROUTINE W REFLEX MICROSCOPIC
Bilirubin Urine: NEGATIVE
Glucose, UA: NEGATIVE
Hgb urine dipstick: NEGATIVE
Ketones, ur: NEGATIVE
Leukocytes,Ua: NEGATIVE
Nitrite: NEGATIVE
Protein, ur: NEGATIVE
Specific Gravity, Urine: 1.006 (ref 1.001–1.035)
pH: 6.5 (ref 5.0–8.0)

## 2022-08-17 LAB — LIPID PANEL
Cholesterol: 155 mg/dL (ref <200)
HDL: 39 mg/dL — ABNORMAL LOW (ref >=40)
LDL Cholesterol (Calc): 92 mg/dL (calc)
Non-HDL Cholesterol (Calc): 116 mg/dL (calc) (ref <130)
Total CHOL/HDL Ratio: 4 (calc) (ref <5.0)
Triglycerides: 138 mg/dL (ref <150)

## 2022-08-17 LAB — MICROALBUMIN / CREATININE URINE RATIO
Creatinine, Urine: 51 mg/dL (ref 20–320)
Microalb Creat Ratio: 4 mcg/mg creat (ref <30)
Microalb, Ur: 0.2 mg/dL

## 2022-08-17 LAB — COMPLETE METABOLIC PANEL WITH GFR
AG Ratio: 1.8 (calc) (ref 1.0–2.5)
ALT: 20 U/L (ref 9–46)
AST: 18 U/L (ref 10–35)
Albumin: 4.5 g/dL (ref 3.6–5.1)
Alkaline phosphatase (APISO): 56 U/L (ref 35–144)
BUN/Creatinine Ratio: 10 (calc) (ref 6–22)
BUN: 13 mg/dL (ref 7–25)
CO2: 30 mmol/L (ref 20–32)
Calcium: 9.6 mg/dL (ref 8.6–10.3)
Chloride: 102 mmol/L (ref 98–110)
Creat: 1.32 mg/dL — ABNORMAL HIGH (ref 0.70–1.28)
Globulin: 2.5 g/dL (calc) (ref 1.9–3.7)
Glucose, Bld: 137 mg/dL — ABNORMAL HIGH (ref 65–99)
Potassium: 4.1 mmol/L (ref 3.5–5.3)
Sodium: 139 mmol/L (ref 135–146)
Total Bilirubin: 1 mg/dL (ref 0.2–1.2)
Total Protein: 7 g/dL (ref 6.1–8.1)
eGFR: 58 mL/min/{1.73_m2} — ABNORMAL LOW (ref >=60)

## 2022-08-17 LAB — CBC WITH DIFFERENTIAL/PLATELET
Absolute Monocytes: 452 cells/uL (ref 200–950)
Basophils Absolute: 52 cells/uL (ref 0–200)
Basophils Relative: 0.9 %
Eosinophils Absolute: 162 cells/uL (ref 15–500)
Eosinophils Relative: 2.8 %
HCT: 45.4 % (ref 38.5–50.0)
Hemoglobin: 16.1 g/dL (ref 13.2–17.1)
Lymphs Abs: 1775 cells/uL (ref 850–3900)
MCH: 32.9 pg (ref 27.0–33.0)
MCHC: 35.5 g/dL (ref 32.0–36.0)
MCV: 92.8 fL (ref 80.0–100.0)
MPV: 10.7 fL (ref 7.5–12.5)
Monocytes Relative: 7.8 %
Neutro Abs: 3358 cells/uL (ref 1500–7800)
Neutrophils Relative %: 57.9 %
Platelets: 221 10*3/uL (ref 140–400)
RBC: 4.89 10*6/uL (ref 4.20–5.80)
RDW: 12.4 % (ref 11.0–15.0)
Total Lymphocyte: 30.6 %
WBC: 5.8 10*3/uL (ref 3.8–10.8)

## 2022-08-17 LAB — INSULIN, RANDOM: Insulin: 19.2 u[IU]/mL — ABNORMAL HIGH

## 2022-08-17 LAB — MAGNESIUM: Magnesium: 2 mg/dL (ref 1.5–2.5)

## 2022-08-17 LAB — PTH, INTACT AND CALCIUM
Calcium: 9.6 mg/dL (ref 8.6–10.3)
PTH: 32 pg/mL (ref 16–77)

## 2022-08-17 LAB — HEMOGLOBIN A1C
Hgb A1c MFr Bld: 6.4 % of total Hgb — ABNORMAL HIGH (ref <5.7)
Mean Plasma Glucose: 137 mg/dL
eAG (mmol/L): 7.6 mmol/L

## 2022-08-17 LAB — TSH: TSH: 1.58 mIU/L (ref 0.40–4.50)

## 2022-08-17 LAB — VITAMIN D 25 HYDROXY (VIT D DEFICIENCY, FRACTURES): Vit D, 25-Hydroxy: 89 ng/mL (ref 30–100)

## 2022-08-17 LAB — PSA: PSA: 0.72 ng/mL (ref <=4.00)

## 2022-08-17 NOTE — Progress Notes (Signed)
<><><><><><><><><><><><><><><><><><><><><><><><><><><><><><><><><> <><><><><><><><><><><><><><><><><><><><><><><><><><><><><><><><><> - Test results slightly outside the reference range are not unusual. If there is anything important, I will review this with you,  otherwise it is considered normal test values.  If you have further questions,  please do not hesitate to contact me at the office or via My Chart.  <><><><><><><><><><><><><><><><><><><><><><><><><><><><><><><><><> <><><><><><><><><><><><><><><><><><><><><><><><><><><><><><><><><>  -  Kidney function is still Stage 3a  & stable  <><><><><><><><><><><><><><><><><><><><><><><><><><><><><><><><><>  -  Total Chol = 155 -  Excellent   - Very low risk for Heart Attack  / Stroke <><><><><><><><><><><><><><><><><><><><><><><><><><><><><><><><><>  -  A1c = 6.4%  - too High - Close to needing to start Diabetic meds     ( Ideal or goal A1c is less than 5.7% )    Being diabetic has a  300% increased risk for heart attack,  stroke, cancer, and alzheimer- type vascular dementia.   It is very important that you work harder with diet by  avoiding all foods that are white except chicken,   fish & calliflower.  - Avoid white rice  (brown & wild rice is OK),   - Avoid white potatoes  (sweet potatoes in moderation is OK),   White bread or wheat bread or anything made out of   white flour like bagels, donuts, rolls, buns, biscuits, cakes,  - pastries, cookies, pizza crust, and pasta (made from  white flour & egg whites)   - vegetarian pasta or spinach or wheat pasta is OK.  - Multigrain breads like Arnold's, Pepperidge Farm or   multigrain sandwich thins or high fiber breads like   Eureka bread or "Dave's Killer" breads that are                                                                 4 to 5 grams fiber per slice !  are best.    Diet, exercise and weight loss can reverse and cure  diabetes in the early stages.    -  Diet, exercise and weight loss is very important in the   control and prevention of complications of diabetes which  affects every system in your body, ie.   -Brain - dementia/stroke,  - eyes - glaucoma/blindness,  - heart - heart attack/heart failure,  - kidneys - dialysis,  - stomach - gastric paralysis,  - intestines - malabsorption,  - nerves - severe painful neuritis,  - circulation - gangrene & loss of a leg(s)  - and finally  . . . . . . . . . . . . . . . . . .    - cancer and Alzheimers. <><><><><><><><><><><><><><><><><><><><><><><><><><><><><><><><><> <><><><><><><><><><><><><><><><><><><><><><><><><><><><><><><><><>  -  PSA - Low - No Prostate Cancer  - Great  <><><><><><><><><><><><><><><><><><><><><><><><><><><><><><><><><>  -  Vitamin D = 89  - Excellent  - Vitamin D goal is between 70-100.   - Please make sure that you are taking your Vitamin D as directed.   - It is very important as a natural anti-inflammatory and helping the  immune system protect against viral infections, like the Covid-19    helping hair, skin, and nails, as well as reducing stroke and  heart attack risk.   - It helps your bones and helps with mood.  - It also decreases numerous cancer risks so please  take  it as directed.   - Low Vit D is associated with a 200-300% higher risk for  CANCER   and 200-300% higher risk for HEART   ATTACK  &  STROKE.    - It is also associated with higher death rate at younger ages,   autoimmune diseases like Rheumatoid arthritis, Lupus,  Multiple Sclerosis.     - Also many other serious conditions, like depression, Alzheimer's  Dementia, infertility, muscle aches, fatigue, fibromyalgia   <><><><><><><><><><><><><><><><><><><><><><><><><><><><><><><><><> <><><><><><><><><><><><><><><><><><><><><><><><><><><><><><><><><>  -

## 2022-10-11 ENCOUNTER — Other Ambulatory Visit: Payer: Self-pay | Admitting: Internal Medicine

## 2022-10-11 DIAGNOSIS — I82401 Acute embolism and thrombosis of unspecified deep veins of right lower extremity: Secondary | ICD-10-CM

## 2022-10-22 ENCOUNTER — Encounter: Payer: Self-pay | Admitting: Internal Medicine

## 2022-10-24 ENCOUNTER — Other Ambulatory Visit: Payer: Self-pay | Admitting: Nurse Practitioner

## 2022-10-24 DIAGNOSIS — Z9109 Other allergy status, other than to drugs and biological substances: Secondary | ICD-10-CM

## 2022-10-26 ENCOUNTER — Ambulatory Visit (INDEPENDENT_AMBULATORY_CARE_PROVIDER_SITE_OTHER): Payer: PPO | Admitting: Nurse Practitioner

## 2022-10-26 ENCOUNTER — Other Ambulatory Visit: Payer: Self-pay

## 2022-10-26 VITALS — HR 56 | Temp 97.5°F | Ht 70.0 in

## 2022-10-26 DIAGNOSIS — R6889 Other general symptoms and signs: Secondary | ICD-10-CM

## 2022-10-26 DIAGNOSIS — Z1152 Encounter for screening for COVID-19: Secondary | ICD-10-CM

## 2022-10-26 LAB — POC COVID19 BINAXNOW: SARS Coronavirus 2 Ag: POSITIVE — AB

## 2022-10-26 LAB — POCT INFLUENZA A/B
Influenza A, POC: NEGATIVE
Influenza B, POC: NEGATIVE

## 2022-10-26 NOTE — Progress Notes (Signed)
THIS ENCOUNTER IS A VIRTUAL VISIT DUE TO COVID-19 - PATIENT WAS NOT SEEN IN THE OFFICE.  PATIENT HAS CONSENTED TO VIRTUAL VISIT / TELEMEDICINE VISIT   Virtual Visit via telephone Note  I connected with  Alex Banda Bilello Jr. on 10/26/2022 by telephone.  I verified that I am speaking with the correct person using two identifiers.    I discussed the limitations of evaluation and management by telemedicine and the availability of in person appointments. The patient expressed understanding and agreed to proceed.  History of Present Illness:  Pulse (!) 56   Temp (!) 97.5 F (36.4 C)   Ht 5\' 10"  (1.778 m)   SpO2 98%   BMI 36.30 kg/m  72 y.o. patient contacted office reporting URI sx slight cough. he tested positive by in home test. OV was conducted by telephone to minimize exposure. This patient has been vaccinated for covid 19, last 12/2020.  Sx began 3 days ago with cough  Treatments tried so far: antihistamine, delsym  Exposures: unknown   Medications   Current Outpatient Medications (Cardiovascular):    bisoprolol-hydrochlorothiazide (ZIAC) 5-6.25 MG tablet, TAKE 1 TABLET DAILY FOR BP / PATIENT KNOWS TO TAKE BY MOUTH  Current Outpatient Medications (Respiratory):    fluticasone (FLONASE) 50 MCG/ACT nasal spray, SPRAY 2 SPRAYS INTO EACH NOSTRIL EVERY DAY   loratadine (CLARITIN) 10 MG tablet, Take 10 mg by mouth daily.   montelukast (SINGULAIR) 10 MG tablet, TAKE 1 TABLET BY MOUTH DAILY FOR ALLERGIES   Current Outpatient Medications (Hematological):    XARELTO 20 MG TABS tablet, TAKE 1 TABLET DAILY TO PREVENT BLOOD CLOTS  Current Outpatient Medications (Other):    B Complex-C (SUPER B COMPLEX PO), Take 1 tablet by mouth daily.   Calcium Carbonate Antacid (TUMS PO), Take by mouth.   Cholecalciferol (VITAMIN D-3) 5000 UNITS TABS, Take 10,000 Units by mouth daily.  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   Social History:   reports that he quit smoking about 47 years ago. His smoking use included cigarettes. He has never used smokeless tobacco. He reports current alcohol use. He reports that he does not use drugs.  Observations/Objective:  General : Well sounding patient in no apparent distress HEENT: no hoarseness, no cough for duration of visit Lungs: speaks in complete sentences, no audible wheezing, no apparent distress Neurological: alert, oriented x 3 Psychiatric: pleasant, judgement appropriate   Assessment and Plan:  Covid 19 Covid 19 positive per rapid screening test in home test Risk factors include: HTN, DVT, CKD,  Symptoms are: mild Due to co morbid conditions and risk factors, discussed antivirals  Immue support reviewed Vtiamin C, Vitamin D, Zinc Take tylenol PRN temp 101+ Push hydration Regular ambulation or calf exercises exercises for clot prevention and continue Xarelto. Sx supportive therapy suggested Follow up via mychart or telephone if needed Advised patient obtain O2 monitor; present to ED if persistently <90% or with severe dyspnea, CP, fever uncontrolled by tylenol, confusion, sudden decline  Should remain in isolation 5 days from testing positive and then wear a mask when around other people for the following 5 days  1. Encounter for screening for COVID-19 Positive  - POC COVID-19  2. Flu-like symptoms  - POCT Influenza A/B  Follow Up Instructions:  I  discussed the assessment and treatment plan with the patient. The patient was provided an opportunity to ask questions and all were answered. The patient agreed with the plan and demonstrated an understanding of the instructions.   The patient was advised to call back or seek an in-person evaluation if the symptoms worsen or if the condition fails to improve as anticipated.  I provided 15 minutes of non-face-to-face time during this encounter.   Darrol Jump, NP

## 2022-11-16 ENCOUNTER — Encounter: Payer: Self-pay | Admitting: Nurse Practitioner

## 2022-11-16 ENCOUNTER — Ambulatory Visit (INDEPENDENT_AMBULATORY_CARE_PROVIDER_SITE_OTHER): Payer: PPO | Admitting: Nurse Practitioner

## 2022-11-16 VITALS — BP 128/76 | HR 66 | Temp 98.1°F | Ht 70.0 in | Wt 262.0 lb

## 2022-11-16 DIAGNOSIS — R6889 Other general symptoms and signs: Secondary | ICD-10-CM | POA: Diagnosis not present

## 2022-11-16 DIAGNOSIS — I82401 Acute embolism and thrombosis of unspecified deep veins of right lower extremity: Secondary | ICD-10-CM | POA: Diagnosis not present

## 2022-11-16 DIAGNOSIS — I10 Essential (primary) hypertension: Secondary | ICD-10-CM

## 2022-11-16 DIAGNOSIS — E782 Mixed hyperlipidemia: Secondary | ICD-10-CM | POA: Diagnosis not present

## 2022-11-16 DIAGNOSIS — F3341 Major depressive disorder, recurrent, in partial remission: Secondary | ICD-10-CM | POA: Diagnosis not present

## 2022-11-16 DIAGNOSIS — F419 Anxiety disorder, unspecified: Secondary | ICD-10-CM

## 2022-11-16 DIAGNOSIS — E559 Vitamin D deficiency, unspecified: Secondary | ICD-10-CM

## 2022-11-16 DIAGNOSIS — Z86010 Personal history of colonic polyps: Secondary | ICD-10-CM | POA: Diagnosis not present

## 2022-11-16 DIAGNOSIS — N1831 Chronic kidney disease, stage 3a: Secondary | ICD-10-CM

## 2022-11-16 DIAGNOSIS — Z6835 Body mass index (BMI) 35.0-35.9, adult: Secondary | ICD-10-CM

## 2022-11-16 DIAGNOSIS — R7303 Prediabetes: Secondary | ICD-10-CM | POA: Diagnosis not present

## 2022-11-16 DIAGNOSIS — Z0001 Encounter for general adult medical examination with abnormal findings: Secondary | ICD-10-CM | POA: Diagnosis not present

## 2022-11-16 DIAGNOSIS — Z79899 Other long term (current) drug therapy: Secondary | ICD-10-CM

## 2022-11-16 DIAGNOSIS — Z0000 Encounter for general adult medical examination without abnormal findings: Secondary | ICD-10-CM

## 2022-11-16 NOTE — Progress Notes (Signed)
WELCOME TO MEDICATE WELLNESS VISIT AND FOLLOW UP Assessment:   Welcome to Medicare Due annually Health maintenance reviewed  Hypertension Controlled Continue Bisoprolol-HCTZ Monitor blood pressure at home; patient to call if consistently greater than 130/80 Continue DASH diet.   Reminder to go to the ER if any CP, SOB, nausea, dizziness, severe HA, changes vision/speech, left arm numbness and tingling and jaw pain.   Cholesterol Controlled  Continue lifestyle modifications. Recommended diet heavy in fruits and veggies, omega 3's. Decrease consumption of animal meats, cheeses, and dairy products. Remain active and exercise as tolerated. Continue to monitor. Check lipids/TSH   Prediabetes Education: Reviewed 'ABCs' of diabetes management  Discussed goals to be met and/or maintained include A1C (<7) Blood pressure (<130/80) Cholesterol (LDL <70) Continue Eye Exam yearly  Continue Dental Exam Q6 mo Discussed dietary recommendations Discussed Physical Activity recommendations Check A1C  Morbid obesity with co morbidities Discussed appropriate BMI Diet modification. Physical activity. Encouraged/praised to build confidence.   Vitamin D Def/ osteoporosis prevention Continue supplement for a goal of 60-100 Monitor levles   Depression/anxiety Manages by lifestyle and prayer Focus on diet/exerise, sleep hygiene, stress management, hydration   Recurrent DVT of RLE Continue xarelto 20 mg  Fall risk discussed and reviewed.   History of adenomatous colon polyp Continue colon cancer screening Due - ordered  CKD Discussed how what you eat and drink can aide in kidney protection. Stay well hydrated. Avoid high salt foods. Avoid NSAIDS. Keep BP and BG well controlled.   Take medications as prescribed. Remain active and exercise as tolerated daily. Maintain weight.  Continue to monitor. Check CMP/GFR/Microablumin  Medication management All medications discussed and  reviewed in full. All questions and concerns regarding medications addressed.    Orders Placed This Encounter  Procedures   CBC with Differential/Platelet   COMPLETE METABOLIC PANEL WITH GFR   Lipid panel   Hemoglobin A1c   VITAMIN D 25 Hydroxy (Vit-D Deficiency, Fractures)    Notify office for further evaluation and treatment, questions or concerns if any reported s/s fail to improve.   The patient was advised to call back or seek an in-person evaluation if any symptoms worsen or if the condition fails to improve as anticipated.   Further disposition pending results of labs. Discussed med's effects and SE's.    I discussed the assessment and treatment plan with the patient. The patient was provided an opportunity to ask questions and all were answered. The patient agreed with the plan and demonstrated an understanding of the instructions.  Discussed med's effects and SE's. Screening labs and tests as requested with regular follow-up as recommended.  I provided 35 minutes of face-to-face time during this encounter including counseling, chart review, and critical decision making was preformed.   Future Appointments  Date Time Provider [place]  02/17/2023  2:30 PM Unk Pinto, MD [facility] None  08/22/2023 10:00 AM Unk Pinto, MD [facility] None     Plan:   During the course of the visit the patient was educated and counseled about appropriate screening and preventive services including:   Pneumococcal vaccine  Influenza vaccine Prevnar 13 Td vaccine Screening electrocardiogram Colorectal cancer screening Diabetes screening Glaucoma screening Nutrition counseling    Subjective:  Alex Lewis. is a 72 y.o. male who presents for initial Medicare Annual Wellness Visit and 3 month follow up. 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  Recently retired in 05/2022 from [employer].  Now looking after his 19 year old mother with dementia and disabled brother.    Patient was treated for a RLE DVT in June 2020 on Xarelto x 3 months, had immedicate reoccurrence 07/12/2019, then again a [place] on 05/28/2021 showed with age indeterminate non-occlusive DVT involving the right femoral vein, and right popliteal vein. He was referred to Dr. Alvy Bimler who recommended xarelto, concern for possible thrombophlebitis with high risk for recurrence due to obesity and sedentary lifestyle. Wearing compression hose daily. Doing well on xarelto at this time.    He has a diagnosis of depression in remission off of medication (has taken wellbutrin XL 150 mg and celexa 20 mg in the past).  Denies depression, reports doing better with lots of prayer.    Last colonoscopy in 2009, 8 polyps, at least 1 with adenomatous changes, overdue, has GI referral already in place and states has phone number to call and schedule.   BMI is Body mass index is 37.59 kg/m., he has not been working on diet and exercise. He is making an effort to eat very slowly, taking his time. Keeping fresh fruits and veggies to snack on. Using [employer] killer bread. Has cut out all desserts.  He reports has started using elliptical, will increase resistance, up to an hour.  Wt Readings from Last 3 Encounters:  11/16/22 262 lb (118.8 kg)  08/16/22 253 lb (114.8 kg)  03/10/22 249 lb 3.2 oz (113 kg)   His blood pressure has been controlled at home, today their BP is BP: 128/76 He does not workout. He denies chest pain, shortness of breath, dizziness.   He is  not on cholesterol medication. His cholesterol is at goal. The cholesterol last visit was:   Lab Results  Component Value Date   CHOL 155 08/16/2022   HDL 39 (L) 08/16/2022   LDLCALC 92 08/16/2022   TRIG 138 08/16/2022   CHOLHDL 4.0 08/16/2022   He has not been working on diet and exercise for prediabetes, and denies polydipsia and polyuria. Last A1C in the office was:  Lab Results  Component Value Date   HGBA1C 6.4 (H) 08/16/2022   Last GFR Lab Results  Component Value Date   EGFR 58 (L) 08/16/2022   Patient is on Vitamin D supplement.   Lab Results  Component Value Date   VD25OH 89 08/16/2022      Medication Review:   Current Outpatient Medications (Cardiovascular):    bisoprolol-hydrochlorothiazide (ZIAC) 5-6.25 MG tablet, TAKE 1 TABLET DAILY FOR BP / PATIENT KNOWS TO TAKE BY MOUTH  Current Outpatient Medications (Respiratory):    fluticasone (FLONASE) 50 MCG/ACT nasal spray, SPRAY 2 SPRAYS INTO EACH NOSTRIL EVERY DAY   loratadine (CLARITIN) 10 MG tablet, Take 10 mg by mouth daily.   montelukast (SINGULAIR) 10 MG tablet, TAKE 1 TABLET BY MOUTH DAILY FOR ALLERGIES   Current Outpatient Medications (Hematological):    XARELTO 20 MG TABS tablet, TAKE 1 TABLET DAILY TO PREVENT BLOOD CLOTS  Current Outpatient Medications (Other):    B Complex-C (SUPER B COMPLEX PO), Take 1 tablet by mouth daily.   Calcium Carbonate Antacid (TUMS PO), Take by mouth.   Cholecalciferol (  VITAMIN D-3) 5000 UNITS TABS, Take 10,000 Units by mouth daily.  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  Screening Tests Immunization History  Administered Date(s) Administered   DTaP 01/26/2000   Fluad Quad(high Dose 65+) 07/22/2021   Influenza Whole 06/22/2013   Influenza, High Dose Seasonal PF 06/27/2015, 06/27/2017, 06/20/2018, 05/13/2019, 06/20/2020   Influenza-Unspecified 07/13/2014, 06/26/2016, 05/13/2019   PFIZER Comirnaty(Gray Top)Covid-19 Tri-Sucrose Vaccine 01/24/2021   PFIZER(Purple Top)SARS-COV-2 Vaccination 11/03/2019, 11/24/2019, 06/20/2020   PPD Test 05/29/2014, 07/30/2015   Pfizer Covid-19 Vaccine Bivalent Booster 74yr & up 07/22/2021   Pneumococcal Conjugate-13 05/13/2017   Pneumococcal Polysaccharide-23 06/20/2018   Tdap 09/06/2014   Zoster, Live 04/22/2013   Health Maintenance  Topic Date Due   Hepatitis C Screening  Never done   Zoster Vaccines- Shingrix (1 of 2) Never done   COLONOSCOPY (Pts 45-460yrInsurance coverage will need to be confirmed)  02/15/2018   COVID-19 Vaccine (6 - 2023-24 season) 05/28/2022   Medicare Annual Wellness (AWV)  11/17/2023   DTaP/Tdap/Td (3 - Td or Tdap) 09/06/2024   Pneumonia Vaccine 689Years old  Completed   INFLUENZA VACCINE  Completed   HPV VACCINES  Aged Out    Last colonoscopy: 2009 - order pending   Names of Other Physician/Practitioners you currently use: 1. [facility] here for primary care 2. Dr ScNicki Reapereye doctor, last visit 2023 3. Dr. GiGordy Levandentist, last visit 2023 4. No, dermatology  Patient Care Team: McUnk PintoMD as PCP - General (Internal Medicine)  Surgical: He  has a past surgical history that includes Knee surgery (Right, 1977); Flexible sigmoidoscopy (1992); and Tonsillectomy and adenoidectomy (1960). Family His family history includes Cancer in his father; Diabetes in his brother and  father. Social history  He reports that he quit smoking about 47 years ago. His smoking use included cigarettes. He has never used smokeless tobacco. He reports current alcohol use. He reports that he does not use drugs.  MEDICARE WELLNESS OBJECTIVES: Physical activity:   Cardiac risk factors:   Depression/mood screen:      11/16/2022    3:01 PM  Depression screen PHQ 2/9  Decreased Interest 0  Down, Depressed, Hopeless 0  PHQ - 2 Score 0    ADLs:     11/16/2022    2:52 PM 08/15/2022    9:20 PM  In your present state of health, do you have any difficulty performing the following activities:  Hearing? 0 0  Vision? 0 0  Walking or climbing stairs? 0 0  Dressing or bathing? 0 0  Doing errands, shopping? 0 0  Preparing Food and eating ? N   Using the Toilet? N   In the past six months, have you accidently leaked urine? N   Do you have problems with loss of bowel control? N   Managing your Medications? N   Managing your Finances? N   Housekeeping or managing your Housekeeping? N      Cognitive Testing  Alert? Yes  Normal Appearance?Yes  Oriented to person? Yes  Place? Yes   Time? Yes  Recall of three objects?  Yes  Can perform simple calculations? Yes  Displays appropriate judgment?Yes  Can read the correct time from a watch face?Yes  EOL planning:     Objective:   Today's Vitals   11/16/22 1411  BP: 128/76  Pulse: 66  Temp: 98.1 F (36.7 C)  SpO2: 98%  Weight: 262 lb (118.8 kg)  Height: 5' 10"$  (1.778 m)   Body mass index is 37.59 kg/m.  General appearance: alert, no distress, WD/WN, male HEENT: normocephalic, sclerae anicteric, TMs pearly, nares patent, no discharge or erythema, pharynx normal Oral cavity: MMM, no lesions Neck: supple, no lymphadenopathy, no thyromegaly, no masses Heart: RRR, normal S1, S2, no murmurs Lungs: CTA bilaterally, no wheezes, rhonchi, or rales Abdomen: +bs, soft, non tender, non distended, no masses, no hepatomegaly, no  splenomegaly Musculoskeletal: nontender, no swelling, no obvious deformity Extremities: no edema, no cyanosis, no clubbing Pulses: 2+ symmetric, upper and lower extremities, normal cap refill Neurological: alert, oriented x 3, CN2-12 intact, strength normal upper extremities and lower extremities, sensation normal throughout, DTRs 2+ throughout, no cerebellar signs, gait normal Psychiatric: normal affect, behavior normal, pleasant   Medicare Attestation I have personally reviewed: The patient's medical and social history Their use of alcohol, tobacco or illicit drugs Their current medications and supplements The patient's functional ability including ADLs,fall risks, home safety risks, cognitive, and hearing and visual impairment Diet and physical activities Evidence for depression or mood disorders  The patient's weight, height, BMI, and visual acuity have been recorded in the chart.  I have made referrals, counseling, and provided education to the patient based on review of the above and I have provided the patient with a written personalized care plan for preventive services.     Darrol Jump, NP   11/16/2022

## 2022-11-16 NOTE — Patient Instructions (Signed)

## 2022-11-17 LAB — CBC WITH DIFFERENTIAL/PLATELET
Absolute Monocytes: 701 cells/uL (ref 200–950)
Basophils Absolute: 29 cells/uL (ref 0–200)
Basophils Relative: 0.4 %
Eosinophils Absolute: 153 cells/uL (ref 15–500)
Eosinophils Relative: 2.1 %
HCT: 44.5 % (ref 38.5–50.0)
Hemoglobin: 15.5 g/dL (ref 13.2–17.1)
Lymphs Abs: 1818 cells/uL (ref 850–3900)
MCH: 31.4 pg (ref 27.0–33.0)
MCHC: 34.8 g/dL (ref 32.0–36.0)
MCV: 90.3 fL (ref 80.0–100.0)
MPV: 10.6 fL (ref 7.5–12.5)
Monocytes Relative: 9.6 %
Neutro Abs: 4599 cells/uL (ref 1500–7800)
Neutrophils Relative %: 63 %
Platelets: 214 10*3/uL (ref 140–400)
RBC: 4.93 10*6/uL (ref 4.20–5.80)
RDW: 12.3 % (ref 11.0–15.0)
Total Lymphocyte: 24.9 %
WBC: 7.3 10*3/uL (ref 3.8–10.8)

## 2022-11-17 LAB — COMPLETE METABOLIC PANEL WITH GFR
AG Ratio: 1.6 (calc) (ref 1.0–2.5)
ALT: 19 U/L (ref 9–46)
AST: 17 U/L (ref 10–35)
Albumin: 4 g/dL (ref 3.6–5.1)
Alkaline phosphatase (APISO): 57 U/L (ref 35–144)
BUN: 16 mg/dL (ref 7–25)
CO2: 29 mmol/L (ref 20–32)
Calcium: 9.2 mg/dL (ref 8.6–10.3)
Chloride: 105 mmol/L (ref 98–110)
Creat: 1.24 mg/dL (ref 0.70–1.28)
Globulin: 2.5 g/dL (calc) (ref 1.9–3.7)
Glucose, Bld: 105 mg/dL — ABNORMAL HIGH (ref 65–99)
Potassium: 4.2 mmol/L (ref 3.5–5.3)
Sodium: 141 mmol/L (ref 135–146)
Total Bilirubin: 0.7 mg/dL (ref 0.2–1.2)
Total Protein: 6.5 g/dL (ref 6.1–8.1)
eGFR: 62 mL/min/{1.73_m2} (ref >=60)

## 2022-11-17 LAB — LIPID PANEL
Cholesterol: 166 mg/dL (ref <200)
HDL: 45 mg/dL (ref >=40)
LDL Cholesterol (Calc): 94 mg/dL (calc)
Non-HDL Cholesterol (Calc): 121 mg/dL (calc) (ref <130)
Total CHOL/HDL Ratio: 3.7 (calc) (ref <5.0)
Triglycerides: 173 mg/dL — ABNORMAL HIGH (ref <150)

## 2022-11-17 LAB — VITAMIN D 25 HYDROXY (VIT D DEFICIENCY, FRACTURES): Vit D, 25-Hydroxy: 90 ng/mL (ref 30–100)

## 2022-11-17 LAB — HEMOGLOBIN A1C
Hgb A1c MFr Bld: 6.5 % of total Hgb — ABNORMAL HIGH (ref <5.7)
Mean Plasma Glucose: 140 mg/dL
eAG (mmol/L): 7.7 mmol/L

## 2023-01-06 ENCOUNTER — Other Ambulatory Visit: Payer: Self-pay | Admitting: Nurse Practitioner

## 2023-01-06 DIAGNOSIS — I82401 Acute embolism and thrombosis of unspecified deep veins of right lower extremity: Secondary | ICD-10-CM

## 2023-02-16 ENCOUNTER — Encounter: Payer: Self-pay | Admitting: Internal Medicine

## 2023-02-16 NOTE — Progress Notes (Unsigned)
Future Appointments  Date Time Provider Department  02/17/2023                   6 mo ov  2:30 PM [name], MD [facility]  08/22/2023                  cpe  10:00 AM [name], MD [facility]    History of Present Illness:       This very nice 72 y.o. MWM presents for 3 month follow up with HTN, HLD, T2_NIDDM and Vitamin D Deficiency.           In 2020 , he was treated for 2 separate episodes of DVT of the RLE and has remained on Xarelto.          Patient is treated for HTN  since 2001   & BP has been controlled at home. Today's BP is at goal -  138/78. 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   Patient has had no complaints of any cardiac type chest pain, palpitations, dyspnea / orthopnea / PND, dizziness, claudication, or dependent edema.        Hyperlipidemia is controlled with diet & meds. Patient denies myalgias or other med SE's. Last Lipids were at goal with slightly elevated Trig's :  Lab Results  Component Value Date   CHOL 166 11/16/2022   HDL 45 11/16/2022   LDLCALC 94 11/16/2022   TRIG 173 (H) 11/16/2022   CHOLHDL 3.7 11/16/2022     Also, the patient has history Morbid Obesity (BMI 36+)  & prediabetes (A1c 6.2% / 2011)   and in Feb 2024 he transitioned to T2_NIDDM with an A1c 6.5%.  Patient  has had no symptoms of reactive hypoglycemia, diabetic polys, paresthesias or visual blurring.  Last A1c was not at goal :  Lab Results  Component Value Date   HGBA1C 6.5 (H) 11/16/2022                                                         Further, the patient also has history of Vitamin D Deficiency and supplements vitamin D . Last vitamin D was at goal :  Lab Results  Component Value Date   VD25OH 90 11/16/2022     Current Outpatient Medications on File Prior to Visit  Medication Sig   SUPER B COMPLEX  Take 1 tablet  daily.   bisoprolol-hctz 5-6.25 MG tablet TAKE 1 TABLET DAILY    Calcium / TUMS  Take daily   VITAMIN D  10,000  Units  Take daily.   FLONASE  nasal spray 2 SPRAYS  EACH NOSTRIL EVERY DAY   loratadine 10 MG tablet Take daily.   montelukast 10 MG tablet TAKE 1 TABLET DAILY   XARELTO 20 MG TABS  TAKE 1 TABLET  EVERY DAY      Allergies  Allergen Reactions   Penicillins     REACTION: unknown     PMHx:   Past Medical History:  Diagnosis Date   Allergic rhinitis    BPH (benign prostatic hyperplasia)    GERD (gastroesophageal reflux disease)    Hypogonadism, male    Kidney stones    Morbid obesity (HCC)    Pre-diabetes    Vitamin D deficiency  Immunization History  Administered Date(s) Administered   DTaP 01/26/2000   Fluad Quad(high Dose) 07/22/2021   Influenza Whole 06/22/2013   Influenza, High Dose  06/27/2015, 06/27/2017, 06/20/2018, 05/13/2019, 06/20/2020   Influenza-Unspecified 07/13/2014, 06/26/2016, 05/13/2019   PFIZER Covid-19  Vacc 01/24/2021   PFIZER SARS-COV-2 Vacc 11/03/2019, 11/24/2019, 06/20/2020   PPD Test 05/29/2014, 07/30/2015   Pfizer Covid-19 Vacc  07/22/2021   Pneumococcal - 13 05/13/2017   Pneumococcal - 23 06/20/2018   Tdap 09/06/2014   Zoster, Live 04/22/2013     Past Surgical History:  Procedure Laterality Date   FLEXIBLE SIGMOIDOSCOPY  1992   KNEE SURGERY Right 1977   TONSILLECTOMY AND ADENOIDECTOMY  1960     FHx:    Reviewed / unchanged   SHx:    Reviewed / unchanged    Systems Review:  Constitutional: Denies fever, chills, wt changes, headaches, insomnia, fatigue, night sweats, change in appetite. Eyes: Denies redness, blurred vision, diplopia, discharge, itchy, watery eyes.  ENT: Denies discharge, congestion, post nasal drip, epistaxis, sore throat, earache, hearing loss, dental pain, tinnitus, vertigo, sinus pain, snoring.  CV: Denies chest pain, palpitations, irregular heartbeat, syncope, dyspnea, diaphoresis, orthopnea, PND, claudication or edema. Respiratory: denies cough, dyspnea, DOE, pleurisy, hoarseness, laryngitis, wheezing.   Gastrointestinal: Denies dysphagia, odynophagia, heartburn, reflux, water brash, abdominal pain or cramps, nausea, vomiting, bloating, diarrhea, constipation, hematemesis, melena, hematochezia  or hemorrhoids. Genitourinary: Denies dysuria, frequency, urgency, nocturia, hesitancy, discharge, hematuria or flank pain. Musculoskeletal: Denies arthralgias, myalgias, stiffness, jt. swelling, pain, limping or strain/sprain.  Skin: Denies pruritus, rash, hives, warts, acne, eczema or change in skin lesion(s). Neuro: No weakness, tremor, incoordination, spasms, paresthesia or pain. Psychiatric: Denies confusion, memory loss or sensory loss. Endo: Denies change in weight, skin or hair change.  Heme/Lymph: No excessive bleeding, bruising or enlarged lymph nodes.   Physical Exam  BP 138/78   Pulse 72   Temp 97.9 F (36.6 C)   Resp 16   Ht 5\' 10"  (1.778 m)   Wt 253 lb 6.4 oz (114.9 kg)   SpO2 96%   BMI 36.36 kg/m   Appears  over  nourished, well groomed  and in no distress.  Eyes: PERRLA, EOMs, conjunctiva no swelling or erythema. Sinuses: No frontal/maxillary tenderness ENT/Mouth: EAC's clear, TM's nl w/o erythema, bulging. Nares clear w/o erythema, swelling, exudates. Oropharynx clear without erythema or exudates. Oral hygiene is good. Tongue normal, non obstructing. Hearing intact.  Neck: Supple. Thyroid not palpable. Car 2+/2+ without bruits, nodes or JVD. Chest: Respirations nl with BS clear & equal w/o rales, rhonchi, wheezing or stridor.  Cor: Heart sounds normal w/ regular rate and rhythm without sig. murmurs, gallops, clicks or rubs. Peripheral pulses normal and equal  without edema.  Abdomen: Soft, Rotund  & bowel sounds normal. Non-tender w/o guarding, rebound, hernias, masses or organomegaly.  Lymphatics: Unremarkable.  Musculoskeletal: Full ROM all peripheral extremities, joint stability, 5/5 strength and normal gait.  Skin: Warm, dry without exposed rashes, lesions or ecchymosis  apparent.  Neuro: Cranial nerves intact, reflexes equal bilaterally. Sensory-motor testing grossly intact. Tendon reflexes grossly intact.  Pysch: Alert & oriented x 3.  Insight and judgement nl & appropriate. No ideations.   Assessment and Plan:  1. Essential hypertension  - Continue medication, monitor blood pressure at home.  - Continue DASH diet.  Reminder to go to the ER if any CP,  SOB, nausea, dizziness, severe HA, changes vision/speech.    - CBC with Differential/Platelet - COMPLETE METABOLIC PANEL WITH GFR -  Magnesium - TSH   2. Hyperlipidemia associated with type 2 diabetes mellitus (HCC)  - Continue diet/meds, exercise,& lifestyle modifications.  - Continue monitor periodic cholesterol/liver & renal functions     - Lipid panel - TSH   3. Type 2 diabetes mellitus with stage 2 chronic kidney                  disease, without long-term current use of insulin (HCC)  -  Start GLP-1 Inh Med / Mounjaro   - Continue diet, exercise  - Lifestyle modifications.  - Monitor appropriate labs    - Hemoglobin A1c - Insulin, random   4. Vitamin D deficiency  - Continue supplementation   - VITAMIN D 25 Hydroxy    5. Current use of long term anticoagulation  - CBC with Differential/Platelet   6. DVT, lower extremity, recurrent, right (HCC)  - CBC with Differential/Platelet   7. Medication management  - CBC with Differential/Platelet - COMPLETE METABOLIC PANEL WITH GFR - Magnesium - Lipid panel - TSH - Hemoglobin A1c - Insulin, random - VITAMIN D 25 Hydroxy           Discussed  regular exercise, BP monitoring, weight control to achieve/maintain BMI less than 25 and discussed med and SE's. Recommended labs to assess /monitor clinical status .  I discussed the assessment and treatment plan with the patient. The patient was provided an opportunity to ask questions and all were answered. The patient agreed with the plan and demonstrated an understanding of  the instructions.  I provided over 30 minutes of exam, counseling, chart review and  complex critical decision making.         The patient was advised to call back or seek an in-person evaluation if the symptoms worsen or if the condition fails to improve as anticipated.   Marinus Maw, MD

## 2023-02-16 NOTE — Patient Instructions (Signed)

## 2023-02-17 ENCOUNTER — Ambulatory Visit (INDEPENDENT_AMBULATORY_CARE_PROVIDER_SITE_OTHER): Payer: PPO | Admitting: Internal Medicine

## 2023-02-17 ENCOUNTER — Encounter: Payer: Self-pay | Admitting: Internal Medicine

## 2023-02-17 VITALS — BP 138/78 | HR 72 | Temp 97.9°F | Resp 16 | Ht 70.0 in | Wt 253.4 lb

## 2023-02-17 DIAGNOSIS — Z7901 Long term (current) use of anticoagulants: Secondary | ICD-10-CM | POA: Diagnosis not present

## 2023-02-17 DIAGNOSIS — I82401 Acute embolism and thrombosis of unspecified deep veins of right lower extremity: Secondary | ICD-10-CM | POA: Diagnosis not present

## 2023-02-17 DIAGNOSIS — E559 Vitamin D deficiency, unspecified: Secondary | ICD-10-CM | POA: Diagnosis not present

## 2023-02-17 DIAGNOSIS — E1122 Type 2 diabetes mellitus with diabetic chronic kidney disease: Secondary | ICD-10-CM

## 2023-02-17 DIAGNOSIS — E1169 Type 2 diabetes mellitus with other specified complication: Secondary | ICD-10-CM | POA: Diagnosis not present

## 2023-02-17 DIAGNOSIS — N182 Chronic kidney disease, stage 2 (mild): Secondary | ICD-10-CM

## 2023-02-17 DIAGNOSIS — I10 Essential (primary) hypertension: Secondary | ICD-10-CM

## 2023-02-17 DIAGNOSIS — Z79899 Other long term (current) drug therapy: Secondary | ICD-10-CM | POA: Diagnosis not present

## 2023-02-17 DIAGNOSIS — E785 Hyperlipidemia, unspecified: Secondary | ICD-10-CM

## 2023-02-17 MED ORDER — TIRZEPATIDE 2.5 MG/0.5ML ~~LOC~~ SOAJ: SUBCUTANEOUS | 0 refills | Status: DC

## 2023-02-18 LAB — LIPID PANEL
Cholesterol: 182 mg/dL (ref <200)
HDL: 48 mg/dL (ref >=40)
LDL Cholesterol (Calc): 112 mg/dL (calc) — ABNORMAL HIGH
Non-HDL Cholesterol (Calc): 134 mg/dL (calc) — ABNORMAL HIGH (ref <130)
Total CHOL/HDL Ratio: 3.8 (calc) (ref <5.0)
Triglycerides: 112 mg/dL (ref <150)

## 2023-02-18 LAB — CBC WITH DIFFERENTIAL/PLATELET
Absolute Monocytes: 497 cells/uL (ref 200–950)
Basophils Absolute: 43 cells/uL (ref 0–200)
Basophils Relative: 0.6 %
Eosinophils Absolute: 128 cells/uL (ref 15–500)
Eosinophils Relative: 1.8 %
HCT: 47 % (ref 38.5–50.0)
Hemoglobin: 16.3 g/dL (ref 13.2–17.1)
Lymphs Abs: 1910 cells/uL (ref 850–3900)
MCH: 31.5 pg (ref 27.0–33.0)
MCHC: 34.7 g/dL (ref 32.0–36.0)
MCV: 90.9 fL (ref 80.0–100.0)
MPV: 10.5 fL (ref 7.5–12.5)
Monocytes Relative: 7 %
Neutro Abs: 4523 cells/uL (ref 1500–7800)
Neutrophils Relative %: 63.7 %
Platelets: 221 10*3/uL (ref 140–400)
RBC: 5.17 10*6/uL (ref 4.20–5.80)
RDW: 12.6 % (ref 11.0–15.0)
Total Lymphocyte: 26.9 %
WBC: 7.1 10*3/uL (ref 3.8–10.8)

## 2023-02-18 LAB — HEMOGLOBIN A1C
Hgb A1c MFr Bld: 6.8 % of total Hgb — ABNORMAL HIGH (ref <5.7)
Mean Plasma Glucose: 148 mg/dL
eAG (mmol/L): 8.2 mmol/L

## 2023-02-18 LAB — COMPLETE METABOLIC PANEL WITH GFR
AG Ratio: 1.8 (calc) (ref 1.0–2.5)
ALT: 21 U/L (ref 9–46)
AST: 20 U/L (ref 10–35)
Albumin: 4.5 g/dL (ref 3.6–5.1)
Alkaline phosphatase (APISO): 63 U/L (ref 35–144)
BUN/Creatinine Ratio: 14 (calc) (ref 6–22)
BUN: 20 mg/dL (ref 7–25)
CO2: 27 mmol/L (ref 20–32)
Calcium: 10.1 mg/dL (ref 8.6–10.3)
Chloride: 105 mmol/L (ref 98–110)
Creat: 1.47 mg/dL — ABNORMAL HIGH (ref 0.70–1.28)
Globulin: 2.5 g/dL (calc) (ref 1.9–3.7)
Glucose, Bld: 141 mg/dL — ABNORMAL HIGH (ref 65–99)
Potassium: 4 mmol/L (ref 3.5–5.3)
Sodium: 142 mmol/L (ref 135–146)
Total Bilirubin: 1.4 mg/dL — ABNORMAL HIGH (ref 0.2–1.2)
Total Protein: 7 g/dL (ref 6.1–8.1)
eGFR: 51 mL/min/{1.73_m2} — ABNORMAL LOW (ref >=60)

## 2023-02-18 LAB — INSULIN, RANDOM: Insulin: 26 u[IU]/mL — ABNORMAL HIGH

## 2023-02-18 LAB — TSH: TSH: 2.09 mIU/L (ref 0.40–4.50)

## 2023-02-18 LAB — MAGNESIUM: Magnesium: 1.9 mg/dL (ref 1.5–2.5)

## 2023-02-18 LAB — VITAMIN D 25 HYDROXY (VIT D DEFICIENCY, FRACTURES): Vit D, 25-Hydroxy: 93 ng/mL (ref 30–100)

## 2023-02-19 NOTE — Progress Notes (Signed)
^<^<^<^<^<^<^<^<^<^<^<^<^<^<^<^<^<^<^<^<^<^<^<^<^<^<^<^<^<^<^<^<^<^<^<^<^ ^>^>^>^>^>^>^>^>^>^>^>>^>^>^>^>^>^>^>^>^>^>^>^>^>^>^>^>^>^>^>^>^>^>^>^>^>  -  Test results slightly outside the reference range are not unusual. If there is anything important, I will review this with you,  otherwise it is considered normal test values.  If you have further questions,  please do not hesitate to contact me at the office or via My Chart.   ^<^<^<^<^<^<^<^<^<^<^<^<^<^<^<^<^<^<^<^<^<^<^<^<^<^<^<^<^<^<^<^<^<^<^<^<^ ^>^>^>^>^>^>^>^>^>^>^>^>^>^>^>^>^>^>^>^>^>^>^>^>^>^>^>^>^>^>^>^>^>^>^>^>^  -  Kidney functions  still Stage 3a  7 Stable   ^<^<^<^<^<^<^<^<^<^<^<^<^<^<^<^<^<^<^<^<^<^<^<^<^<^<^<^<^<^<^<^<^<^<^<^<^ ^>^>^>^>^>^>^>^>^>^>^>^>^>^>^>^>^>^>^>^>^>^>^>^>^>^>^>^>^>^>^>^>^>^>^>^>^   -  Total  Chol =  182 -  Borderline             (  Ideal  or  Goal is less than 180  !  )  But  -  Bad / Dangerous LDL  Chol = 112   -  Elevated              (  Ideal  or  Goal is less than 70  !  )    - Hopefully with weight loss on the [facility], this will improve !  ^<^<^<^<^<^<^<^<^<^<^<^<^<^<^<^<^<^<^<^<^<^<^<^<^<^<^<^<^<^<^<^<^<^<^<^<^ ^>^>^>^>^>^>^>^>^>^>^>^>^>^>^>^>^>^>^>^>^>^>^>^>^>^>^>^>^>^>^>^>^>^>^>^>^  -  A1c = 6.8% even higher in the Diabetic range !  ^<^<^<^<^<^<^<^<^<^<^<^<^<^<^<^<^<^<^<^<^<^<^<^<^<^<^<^<^<^<^<^<^<^<^<^<^ ^>^>^>^>^>^>^>^>^>^>^>^>^>^>^>^>^>^>^>^>^>^>^>^>^>^>^>^>^>^>^>^>^>^>^>^>^  - Vitamin D = 93 - Excellent - Please keep dose same  ^<^<^<^<^<^<^<^<^<^<^<^<^<^<^<^<^<^<^<^<^<^<^<^<^<^<^<^<^<^<^<^<^<^<^<^<^ ^>^>^>^>^>^>^>^>^>^>^>^>^>^>^>^>^>^>^>^>^>^>^>^>^>^>^>^>^>^>^>^>^>^>^>^>^  - All Else - CBC - Kidneys - Electrolytes - Liver - Magnesium & Thyroid    - all  Normal / OK  ^<^<^<^<^<^<^<^<^<^<^<^<^<^<^<^<^<^<^<^<^<^<^<^<^<^<^<^<^<^<^<^<^<^<^<^<^ ^>^>^>^>^>^>^>^>^>^>^>^>^>^>^>^>^>^>^>^>^>^>^>^>^>^>^>^>^>^>^>^>^>^>^>^>^

## 2023-02-22 ENCOUNTER — Telehealth: Payer: Self-pay

## 2023-02-22 NOTE — Telephone Encounter (Signed)
Mounjaro prior auth for 2.5mg  has been approved through 02/18/24.

## 2023-03-10 ENCOUNTER — Ambulatory Visit (INDEPENDENT_AMBULATORY_CARE_PROVIDER_SITE_OTHER): Payer: PPO | Admitting: Nurse Practitioner

## 2023-03-10 ENCOUNTER — Encounter: Payer: Self-pay | Admitting: Nurse Practitioner

## 2023-03-10 VITALS — BP 120/74 | HR 76 | Temp 98.0°F | Resp 16 | Ht 70.0 in | Wt 255.6 lb

## 2023-03-10 DIAGNOSIS — I10 Essential (primary) hypertension: Secondary | ICD-10-CM | POA: Diagnosis not present

## 2023-03-10 DIAGNOSIS — S30860A Insect bite (nonvenomous) of lower back and pelvis, initial encounter: Secondary | ICD-10-CM | POA: Diagnosis not present

## 2023-03-10 DIAGNOSIS — W57XXXA Bitten or stung by nonvenomous insect and other nonvenomous arthropods, initial encounter: Secondary | ICD-10-CM

## 2023-03-10 NOTE — Progress Notes (Signed)
Assessment and Plan:  Elior was seen today for follow-up.  Diagnoses and all orders for this visit:  Essential hypertension - continue medications, DASH diet, exercise and monitor at home. Call if greater than 130/80.   Tick bite of back, initial encounter Advised area appears to be healing well, no bulls eye rash or abnormal rashes or ulcers surrounding. Advised to not continue to pick the scab off.  Allow to heal naturally. Continue to monitor and if any changes are noted notify the office      Further disposition pending results of labs. Discussed med's effects and SE's.   Over 30 minutes of exam, counseling, chart review, and critical decision making was performed.   Future Appointments  Date Time Provider Department Center  05/20/2023  9:30 AM Adela Glimpse, NP [facility] None  08/22/2023 10:00 AM [name], MD [facility] None  11/22/2023  2:30 PM Cranford, Archie Patten, NP [facility] None    ------------------------------------------------------------------------------------------------------------------   HPI BP 120/74   Pulse 76   Temp 98 F (36.7 C)   Resp 16   Ht 5\' 10"  (1.778 m)   Wt 255 lb 9.6 oz (115.9 kg)   SpO2 97%   BMI 36.67 kg/m   72 y.o.male presents for tick bite that is now 4-5 weeks out. States it is sore to the touch and has a red scab.  Denies fever, body aches and rash.    Currently well controlled with Ziac 5/6.25 mg QD.  Denies chest pain, shortness of breath, dizziness   BP Readings from Last 3 Encounters:  03/10/23 120/74  02/17/23 138/78  11/16/22 128/76   He has been getting headaches since taking Mounjaro. Has been drinking less coffee.  Advised to make sure he is getting approximately 64 ounces of water a day  Past Medical History:  Diagnosis Date   Allergic rhinitis    BPH (benign prostatic hyperplasia)    GERD (gastroesophageal reflux disease)    Hypogonadism, male    Kidney stones    Morbid obesity (HCC)    Pre-diabetes     Vitamin D deficiency      Allergies  Allergen Reactions   Penicillins     REACTION: unknown    Current Outpatient Medications on File Prior to Visit  Medication Sig   B Complex-C (SUPER B COMPLEX PO) Take 1 tablet by mouth daily.   bisoprolol-hydrochlorothiazide (ZIAC) 5-6.25 MG tablet TAKE 1 TABLET DAILY FOR BP / PATIENT KNOWS TO TAKE BY MOUTH   Calcium Carbonate Antacid (TUMS PO) Take by mouth.   Cholecalciferol (VITAMIN D-3) 5000 UNITS TABS Take 10,000 Units by mouth daily.   fluticasone (FLONASE) 50 MCG/ACT nasal spray SPRAY 2 SPRAYS INTO EACH NOSTRIL EVERY DAY   loratadine (CLARITIN) 10 MG tablet Take 10 mg by mouth daily.   montelukast (SINGULAIR) 10 MG tablet TAKE 1 TABLET BY MOUTH DAILY FOR ALLERGIES   tirzepatide (MOUNJARO) 2.5 MG/0.5ML Pen Inject 1 pen (2.5 mg)   into  Skin every 7 days   for Diabetes     ( e11.9 )   XARELTO 20 MG TABS tablet TAKE 1 TABLET BY MOUTH EVERY DAY TO PREVENT BLOOD CLOTS   No current facility-administered medications on file prior to visit.    ROS: all negative except above.   Physical Exam:  BP 120/74   Pulse 76   Temp 98 F (36.7 C)   Resp 16   Ht 5\' 10"  (1.778 m)   Wt 255 lb 9.6 oz (115.9 kg)  SpO2 97%   BMI 36.67 kg/m   General Appearance: Well nourished, in no apparent distress. Eyes: PERRLA, EOMs, conjunctiva no swelling or erythema Respiratory: Respiratory effort normal, BS equal bilaterally without rales, rhonchi, wheezing or stridor.  Cardio: RRR with no MRGs. Brisk peripheral pulses without edema.  Abdomen: Soft, + BS.  Non tender, no guarding, rebound, hernias, masses. Lymphatics: Non tender without lymphadenopathy.  Musculoskeletal: Full ROM, 5/5 strength, normal gait.  Skin: Warm, dry without rashes. Small mildly erythematous raised area of right lower back, < 0.5 cm where tick was removed. Psych: Awake and oriented X 3, normal affect, Insight and Judgment appropriate.     Raynelle Dick, NP 1:48 PM [facility]

## 2023-03-10 NOTE — Patient Instructions (Signed)
Tick Bite Information, Adult  Ticks are insects that draw blood for food. They climb onto people and animals that brush against the leaves and grasses that they live in. They then bite and attach to the skin. Most ticks are harmless, but some ticks may carry germs that can cause disease. These germs are spread to a person through a bite. To lower your risk of getting a disease from a tick bite, make sure you: Take steps to prevent tick bites. Check for ticks after being outdoors where ticks live. Watch for symptoms of disease if a tick attached to you or if you think a tick bit you. How can I prevent tick bites? Take these steps to help prevent tick bites when you go outdoors in an area where ticks live: Before you go outdoors: Wear long sleeves and long pants to protect your skin from ticks. Wear light-colored clothing so you can see ticks easier. Tuck your pant legs into your socks. Apply insect repellent that has DEET (20% or higher), picaridin, or IR3535 in it to the following areas: Any bare skin. Avoid areas around the eyes and mouth. Edges of clothing, like the top of your boots, the bottom of your pant legs, and your sleeve cuffs. Consider applying an insect repellant that contains permethrin. Follow the instructions on the label. Do not apply permethrin directly to the skin. Instead, apply to the following areas: Clothing and shoes. Outdoor gear and tents. When you are outdoors: Avoid walking through areas with long grass. If you are walking on a trail, stay in the middle of the trail so your skin, hair, and clothing do not touch the bushes. Check for ticks on your clothing, hair, and skin often while you are outdoors. Check again before you go inside. When you go indoors: Check your clothing for ticks. Tumble dry clothes in a dryer on high heat for at least 10 minutes. If clothes are damp, additional time may be needed. If clothes require washing, use hot water. Check your gear and  pets. Shower soon after being outdoors. Check your body for ticks. Do a full body check using a mirror. Be sure to check your scalp, neck, armpits, waist, groin, and joint areas. These are the spots where ticks attach themselves most often. What is the best way to remove a tick?  Remove the tick as soon as possible. Removing it can prevent germs from passing to your body. Do not remove the tick with your bare fingers. Do not try to remove a tick with heat, alcohol, petroleum jelly, or fingernail polish. These things can cause the tick to salivate and regurgitate into your bloodstream, increasing your risk of getting a disease. To remove a tick that is crawling on your skin: Go outside and brush the tick off. Use tape or a lint roller. To remove a tick that is attached to your skin: Wash your hands. If you have gloves, put them on. Use a fine-tipped tweezer, curved forceps, or a tick-removal tool to gently grasp the tick as close to your skin and the tick's head as possible. Gently pull with a steady, upward, and even pressure until the tick lets go. While removing the tick: Take care to keep the tick's head attached to its body. Do not twist or jerk the tick. This can make the tick's head or mouth parts break off and stay in your skin. If this happens, try to remove the mouth parts with tweezers. If you cannot remove them, leave   the area alone and let the skin heal. Do not squeeze or crush the tick's body. This could force disease-carrying fluids from the tick into your body. What should I do after removing a tick? Clean the bite area and your hands with soap and water, rubbing alcohol, or an iodine scrub. If an antiseptic cream or ointment is available, put a small amount on the bite area. Wash and disinfect any tools that you used to remove the tick. How should I dispose of a tick? To dispose of a live tick, use one of these methods: Place it in rubbing alcohol. Place it in a sealed bag  or container, and throw it away. Wrap it tightly in tape, and throw it away. Flush it down the toilet. Where to find more information Centers for Disease Control and Prevention: cdc.gov/ticks [place] Environmental Protection Agency: epa.gov/insect-repellents Contact a health care provider if: You have symptoms of a disease after a tick bite. Symptoms of a tick-borne disease can occur from moments after the tick bites to 30 days after a tick is removed. Symptoms include: Fever or chills. A red rash that makes a circle (bull's-eye rash) in the bite area. Redness and swelling in the bite area. Headache or stiff neck. Muscle, joint, or bone pain. Abnormal tiredness. Numbness in your legs or trouble walking or moving your legs. Tender or swollen lymph glands. Abdominal pain, vomiting, diarrhea, or weight loss. Get help right away if: You are not able to remove a tick. You have muscle weakness or paralysis. Your symptoms get worse or you experience new symptoms. You find an engorged tick on your skin and you are in an area where there is a higher risk of disease from ticks. Summary Ticks may carry germs that can spread to a person through a bite. These germs can cause disease. Wear protective clothing and use insect repellent to prevent tick bites. Follow the instructions on the label. If you find a tick on your body, remove it as soon as possible. If the tick is attached, do not try to remove it with heat, alcohol, petroleum jelly, or fingernail polish. If you have symptoms of a disease after being bitten by a tick, contact a health care provider. This information is not intended to replace advice given to you by your health care provider. Make sure you discuss any questions you have with your health care provider. Document Revised: 12/14/2021 Document Reviewed: 12/14/2021 Elsevier Patient Education  2024 [employer].  

## 2023-03-19 ENCOUNTER — Other Ambulatory Visit: Payer: Self-pay | Admitting: Internal Medicine

## 2023-03-19 MED ORDER — TIRZEPATIDE 5 MG/0.5ML ~~LOC~~ SOAJ: SUBCUTANEOUS | 0 refills | Status: DC

## 2023-04-02 ENCOUNTER — Other Ambulatory Visit: Payer: Self-pay | Admitting: Nurse Practitioner

## 2023-04-02 DIAGNOSIS — I82401 Acute embolism and thrombosis of unspecified deep veins of right lower extremity: Secondary | ICD-10-CM

## 2023-04-05 ENCOUNTER — Telehealth: Payer: Self-pay | Admitting: Nurse Practitioner

## 2023-04-05 ENCOUNTER — Other Ambulatory Visit: Payer: Self-pay | Admitting: Internal Medicine

## 2023-04-05 NOTE — Telephone Encounter (Signed)
Patient came into the office and stated that he is now in the donut hole and his xarelto is too expensive. Please advise as to what he should do.

## 2023-04-05 NOTE — Telephone Encounter (Signed)
Can you suggest an alternative for him. I only saw him once for a tick bite

## 2023-04-06 ENCOUNTER — Ambulatory Visit: Payer: PPO | Admitting: Nurse Practitioner

## 2023-04-06 ENCOUNTER — Encounter: Payer: Self-pay | Admitting: Nurse Practitioner

## 2023-04-06 VITALS — BP 118/72 | HR 72 | Temp 97.7°F | Ht 70.0 in | Wt 245.4 lb

## 2023-04-06 DIAGNOSIS — D684 Acquired coagulation factor deficiency: Secondary | ICD-10-CM

## 2023-04-06 DIAGNOSIS — Z79899 Other long term (current) drug therapy: Secondary | ICD-10-CM

## 2023-04-06 DIAGNOSIS — R69 Illness, unspecified: Secondary | ICD-10-CM

## 2023-04-06 DIAGNOSIS — Z7901 Long term (current) use of anticoagulants: Secondary | ICD-10-CM

## 2023-04-06 DIAGNOSIS — I82401 Acute embolism and thrombosis of unspecified deep veins of right lower extremity: Secondary | ICD-10-CM

## 2023-04-06 DIAGNOSIS — D689 Coagulation defect, unspecified: Secondary | ICD-10-CM | POA: Insufficient documentation

## 2023-04-06 MED ORDER — WARFARIN SODIUM 5 MG PO TABS: 5.0000 mg | ORAL_TABLET | Freq: Every day | ORAL | 0 refills | Status: DC

## 2023-04-06 NOTE — Progress Notes (Signed)
Assessment and Plan:  Alex Lewis. was seen today for an episodic visit.  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 Start Coumadin 5 mg QD therapy RTC in 10 days to reassess INR levels and review medication effectiveness. Obtain baseline blood work for further medication management.  - CBC with Differential/Platelet - warfarin (COUMADIN) 5 MG tablet; Take 1 tablet (5 mg total) by mouth daily.  Dispense: 90 tablet; Refill: 0  Current use of long term anticoagulation Discussed difference between  - Recurrent Miscarriage Evaluation/Coagulation Panel with Consultation - CBC with Differential/Platelet - Protime-INR - APTT - warfarin (COUMADIN) 5 MG tablet; Take 1 tablet (5 mg total) by mouth daily.  Dispense: 90 tablet; Refill: 0  3. Medication management *** - Recurrent Miscarriage Evaluation/Coagulation Panel with Consultation - CBC with Differential/Platelet - Protime-INR - APTT    Continue to monitor for any increase in fever, chills, N/V, diarrhea, changes to bowel habits, blood in stool.  Notify office for further evaluation and treatment, questions or concerns if s/s fail to improve. The risks and benefits of my recommendations, as well as other treatment options were discussed with the patient today. Questions were answered.  Further disposition pending results of labs. Discussed med's effects and SE's.    Over *** minutes of exam, counseling, chart review, and critical decision making was performed.   Future Appointments  Date Time Provider Department Center  05/20/2023  9:30 AM Adela Glimpse, NP [facility] None  08/22/2023 10:00 AM Alex Cowboy, MD [facility] None  11/22/2023  2:30 PM Farid Grigorian, Archie Patten, NP [facility] None     ------------------------------------------------------------------------------------------------------------------   HPI BP 118/72   Pulse 72   Temp 97.7 F (36.5 C)   Ht 5\' 10"  (1.778 m)   Wt 245 lb 6.4 oz (111.3 kg)   SpO2 95%   BMI 35.21 kg/m  72 y.o.male presents for  Past Medical History:  Diagnosis Date   Allergic rhinitis    BPH (benign prostatic hyperplasia)    GERD (gastroesophageal reflux disease)    Hypogonadism, male    Kidney stones    Morbid obesity (HCC)    Pre-diabetes    Vitamin D deficiency      Allergies  Allergen Reactions   Penicillins     REACTION: unknown    Current Outpatient Medications on File Prior to Visit  Medication Sig   B Complex-C (SUPER B COMPLEX PO) Take 1 tablet by mouth daily.   bisoprolol-hydrochlorothiazide (ZIAC) 5-6.25 MG tablet TAKE 1 TABLET DAILY FOR BP / PATIENT KNOWS TO TAKE BY MOUTH   Calcium Carbonate Antacid (TUMS PO) Take by mouth.   Cholecalciferol (VITAMIN D-3) 5000 UNITS TABS Take 10,000 Units by mouth daily.   fluticasone (FLONASE) 50 MCG/ACT nasal spray SPRAY 2 SPRAYS INTO EACH NOSTRIL EVERY DAY   loratadine (CLARITIN) 10 MG tablet Take 10 mg by mouth daily.   montelukast (SINGULAIR) 10 MG tablet TAKE 1 TABLET BY MOUTH DAILY FOR ALLERGIES   tirzepatide (MOUNJARO) 5 MG/0.5ML Pen Inject  1 pen (5 mg)  into Skin  every 7 days  for Diabetes  ( e11.29)   No current facility-administered medications on file prior to visit.    ROS: all negative except what is noted in the HPI.   Physical Exam:  BP 118/72   Pulse 72   Temp 97.7 F (36.5 C)   Ht 5\' 10"  (1.778 m)  Wt 245 lb 6.4 oz (111.3 kg)   SpO2 95%   BMI 35.21 kg/m   General Appearance: NAD.  Awake, conversant and cooperative. Eyes: PERRLA, EOMs intact.  Sclera white.  Conjunctiva without erythema. Sinuses: No frontal/maxillary tenderness.  No nasal discharge. Nares patent.  ENT/Mouth: Ext aud canals clear.  Bilateral TMs w/DOL and without erythema  or bulging. Hearing intact.  Posterior pharynx without swelling or exudate.  Tonsils without swelling or erythema.  Neck: Supple.  No masses, nodules or thyromegaly. Respiratory: Effort is regular with non-labored breathing. Breath sounds are equal bilaterally without rales, rhonchi, wheezing or stridor.  Cardio: RRR with no MRGs. Brisk peripheral pulses without edema.  Abdomen: Active BS in all four quadrants.  Soft and non-tender without guarding, rebound tenderness, hernias or masses. Lymphatics: Non tender without lymphadenopathy.  Musculoskeletal: Full ROM, 5/5 strength, normal ambulation.  No clubbing or cyanosis. Skin: Appropriate color for ethnicity. Warm without rashes, lesions, ecchymosis, ulcers.  Neuro: CN II-XII grossly normal. Normal muscle tone without cerebellar symptoms and intact sensation.   Psych: AO X 3,  appropriate mood and affect, insight and judgment.     Adela Glimpse, NP 4:39 PM [facility]

## 2023-04-06 NOTE — Patient Instructions (Signed)
Vitamin K Foods and Warfarin Warfarin is a blood thinner (anticoagulant). Anticoagulant medicines help prevent blood clots from forming or getting bigger. Warfarin works by blocking the activity of vitamin K. Vitamin K promotes normal blood clotting. When you take warfarin, problems can occur from suddenly increasing or decreasing the amount of vitamin K that you eat from one day to the next. These problems can occur due to varying levels of warfarin in your blood. Problems may include blood clots or bleeding. What are tips for eating the right amount of vitamin K? Reading food labels Know which foods contain vitamin K. Read food labels. Use the lists below to understand serving sizes and the amount of vitamin K in one serving. If you take a multivitamin that contains vitamin K, be sure to take it every day. Meal planning To avoid problems when taking warfarin: Eat a balanced diet that includes: Fresh fruits and vegetables. Whole grains. Low-fat dairy products. Lean proteins, such as fish, eggs, and lean cuts of meat. Avoid major changes in your diet. If you are going to change your diet, talk with your health care provider before making changes. Keep your intake of vitamin K consistent from day to day. Avoid eating large amounts of vitamin K one day and small amounts of vitamin K the next day. Work with a dietitian to develop a meal plan that works best for you.  What foods are high in vitamin K? Foods that are high in vitamin K contain more than 100 mcg (micrograms) per serving. These include: Broccoli (cooked from fresh) -  cup (78 g) has 110 mcg. Brussels sprouts (cooked from fresh) -  cup (78 g) has 109 mcg. Greens, beet (cooked from fresh) -  cup (72 g) has 350 mcg. Greens, collard (cooked from fresh) -  cup (66 g) has 263 mcg. Greens, turnip (cooked from fresh) -  cup (72 g) has 265 mcg. Green onions or scallions -  cup (50 g) has 105 mcg. Kale (cooked from fresh) -  cup (68  g) has 536 mcg. Parsley (raw) - 10 sprigs (10 g) has 164 mcg. Spinach (cooked from fresh) -  cup (90 g) has 444 mcg. Swiss chard (cooked from fresh) -  cup (88 g) has 287 mcg. The items listed above may not be a complete list of foods high in Vitamin K. Actual amounts of Vitamin K may differ depending on processing. Contact a dietitian for more information. What foods have a moderate amount of vitamin K? Foods that have a moderate amount of vitamin K contain 25-100 mcg per serving. These include: Asparagus (cooked from fresh) - 4 spears (60 g) have 30 mcg. Black-eyed peas (dried) -  cup (85 g) has 32 mcg. Cabbage (cooked from fresh) -  cup (78 g) has 84 mcg. Cabbage (raw) -  cup (35 g) has 26 mcg. Kiwi fruit - 1 medium (69 g) has 27 mcg. Lettuce (raw) - 1 cup (36 g) has 45 mcg. Okra (cooked from fresh) -  cup (80 g) has 32 mcg. Prunes (dried) - 5 prunes (47 g) have 25 mcg. Tuna, light, canned in oil - 3 oz (85 g) has 37 mcg. Watercress (raw) - 1 cup (34 g) has 85 mcg. The items listed above may not be a complete list of foods with a moderate amount of Vitamin K. Actual amounts of Vitamin K may differ depending on processing. Contact a dietitian for more information. What foods are low in vitamin K? Foods low in   vitamin K contain less than 25 mcg per serving. These include: Artichoke - 1 medium (128 g) has 18 mcg. Avocado - 1 oz (21 g) has 6 mcg. Blueberries -  cup (73 g) has 14 mcg. Carrots (cooked from fresh) -  cup (78 g) has 11 mcg. Cauliflower (raw) -  cup (54 g) has 8 mcg. Cucumber with peel (raw) -  cup (52 g) has 9 mcg. Grapes -  cup (76 g) has 12 mcg. Mango - 1 medium (207 g) has 9 mcg. Mixed nuts - 1 cup (142 g) has 17 mcg. Pear - 1 medium (178 g) has 8 mcg. Peas (cooked from fresh) -  cup (80 g) has 20 mcg. Pickled cucumber - 1 spear (65 g) has 11 mcg. Sauerkraut (canned) -  cup (118 g) has 16 mcg. Soybeans (cooked from fresh) -  cup (86 g) has 16 mcg. Tomato  (raw) - 1 medium (123 g) has 10 mcg. Tomato sauce (raw) -  cup (123 g) has 17 mcg. The items listed above may not be a complete list of foods low in Vitamin K. Actual amounts of Vitamin K may differ depending on processing. Contact a dietitian for more information. What foods do not have vitamin K? If a food contains less than 5 mcg per serving, it is considered to have no vitamin K. These foods include: Bread and cereal products. Cheese. Eggs. Fish and shellfish. Meat and poultry. Milk and dairy products. Seeds, such as sunflower or pumpkin seeds. The items listed above may not be a complete list of foods that do not have vitamin K. Actual amounts of vitamin K may differ depending on processing. Contact a dietitian for more information. Summary Warfarin is an anticoagulant that prevents blood clots from forming or getting bigger by blocking the activity of vitamin K. It is important to know the amount of vitamin K that is in the foods you eat and to keep your intake of vitamin K consistent from day to day. Avoid major changes in your diet. If you are going to change your diet, talk with your health care provider before making changes. This information is not intended to replace advice given to you by your health care provider. Make sure you discuss any questions you have with your health care provider. Document Revised: 11/19/2020 Document Reviewed: 11/19/2020 Elsevier Patient Education  2024 Elsevier Inc.  

## 2023-04-17 ENCOUNTER — Other Ambulatory Visit: Payer: Self-pay | Admitting: Internal Medicine

## 2023-04-17 DIAGNOSIS — E1122 Type 2 diabetes mellitus with diabetic chronic kidney disease: Secondary | ICD-10-CM

## 2023-04-17 MED ORDER — TIRZEPATIDE 7.5 MG/0.5ML ~~LOC~~ SOAJ
SUBCUTANEOUS | 0 refills | Status: DC
Start: 2023-04-17 — End: 2023-05-21

## 2023-04-18 ENCOUNTER — Telehealth: Payer: Self-pay | Admitting: Nurse Practitioner

## 2023-04-18 DIAGNOSIS — Z9109 Other allergy status, other than to drugs and biological substances: Secondary | ICD-10-CM

## 2023-04-18 MED ORDER — MONTELUKAST SODIUM 10 MG PO TABS: ORAL_TABLET | ORAL | 3 refills | Status: DC

## 2023-04-18 NOTE — Addendum Note (Signed)
Addended by: Dionicio Stall on: 04/18/2023 11:49 AM   Modules accepted: Orders

## 2023-04-18 NOTE — Telephone Encounter (Signed)
Requesting refill on singulair. Pls send to [facility] on [address] on file

## 2023-05-14 ENCOUNTER — Other Ambulatory Visit: Payer: Self-pay | Admitting: Nurse Practitioner

## 2023-05-14 DIAGNOSIS — I10 Essential (primary) hypertension: Secondary | ICD-10-CM

## 2023-05-20 ENCOUNTER — Ambulatory Visit (INDEPENDENT_AMBULATORY_CARE_PROVIDER_SITE_OTHER): Payer: PPO | Admitting: Nurse Practitioner

## 2023-05-20 ENCOUNTER — Encounter: Payer: Self-pay | Admitting: Nurse Practitioner

## 2023-05-20 VITALS — BP 118/76 | HR 72 | Temp 97.9°F | Resp 17 | Ht 70.0 in | Wt 225.6 lb

## 2023-05-20 DIAGNOSIS — E782 Mixed hyperlipidemia: Secondary | ICD-10-CM | POA: Diagnosis not present

## 2023-05-20 DIAGNOSIS — Z79899 Other long term (current) drug therapy: Secondary | ICD-10-CM

## 2023-05-20 DIAGNOSIS — N183 Chronic kidney disease, stage 3 unspecified: Secondary | ICD-10-CM

## 2023-05-20 DIAGNOSIS — R7303 Prediabetes: Secondary | ICD-10-CM | POA: Diagnosis not present

## 2023-05-20 DIAGNOSIS — Z86010 Personal history of colonic polyps: Secondary | ICD-10-CM

## 2023-05-20 DIAGNOSIS — I10 Essential (primary) hypertension: Secondary | ICD-10-CM | POA: Diagnosis not present

## 2023-05-20 DIAGNOSIS — E559 Vitamin D deficiency, unspecified: Secondary | ICD-10-CM

## 2023-05-20 DIAGNOSIS — I82401 Acute embolism and thrombosis of unspecified deep veins of right lower extremity: Secondary | ICD-10-CM | POA: Diagnosis not present

## 2023-05-20 DIAGNOSIS — E119 Type 2 diabetes mellitus without complications: Secondary | ICD-10-CM

## 2023-05-20 DIAGNOSIS — F3341 Major depressive disorder, recurrent, in partial remission: Secondary | ICD-10-CM | POA: Diagnosis not present

## 2023-05-20 NOTE — Patient Instructions (Signed)

## 2023-05-20 NOTE — Progress Notes (Signed)
FOLLOW UP Assessment:   Hypertension Controlled Continue Bisoprolol-HCTZ Monitor blood pressure at home; patient to call if consistently greater than 130/80 Continue DASH diet.   Reminder to go to the ER if any CP, SOB, nausea, dizziness, severe HA, changes vision/speech, left arm numbness and tingling and jaw pain.   Cholesterol Controlled  Continue lifestyle modifications. Recommended diet heavy in fruits and veggies, omega 3's. Decrease consumption of animal meats, cheeses, and dairy products. Remain active and exercise as tolerated. Continue to monitor. Check lipids/TSH   DM2 Education: Reviewed 'ABCs' of diabetes management  Discussed goals to be met and/or maintained include A1C (<7) Blood pressure (<130/80) Cholesterol (LDL <70) Continue Eye Exam yearly  Continue Dental Exam Q6 mo Discussed dietary recommendations Discussed Physical Activity recommendations Check A1C  Morbid obesity with co morbidities Discussed appropriate BMI Diet modification. Physical activity. Encouraged/praised to build confidence.   Vitamin D Def/ osteoporosis prevention Continue supplement for a goal of 60-100 Monitor levles   Depression/anxiety Manages by lifestyle and prayer Focus on diet/exerise, sleep hygiene, stress management, hydration   Recurrent DVT of RLE Continue xarelto 20 mg  Fall risk discussed and reviewed.   History of adenomatous colon polyp Continue colon cancer screening Due - ordered  CKD Discussed how what you eat and drink can aide in kidney protection. Stay well hydrated. Avoid high salt foods. Avoid NSAIDS. Keep BP and BG well controlled.   Take medications as prescribed. Remain active and exercise as tolerated daily. Maintain weight.  Continue to monitor. Check CMP/GFR/Microablumin  Medication management All medications discussed and reviewed in full. All questions and concerns regarding medications addressed.    Orders Placed This Encounter   Procedures   CBC with Differential/Platelet   COMPLETE METABOLIC PANEL WITH GFR   Lipid panel   Hemoglobin A1c   Notify office for further evaluation and treatment, questions or concerns if any reported s/s fail to improve.   The patient was advised to call back or seek an in-person evaluation if any symptoms worsen or if the condition fails to improve as anticipated.   Further disposition pending results of labs. Discussed med's effects and SE's.    I discussed the assessment and treatment plan with the patient. The patient was provided an opportunity to ask questions and all were answered. The patient agreed with the plan and demonstrated an understanding of the instructions.  Discussed med's effects and SE's. Screening labs and tests as requested with regular follow-up as recommended.  I provided 30 minutes of face-to-face time during this encounter including counseling, chart review, and critical decision making was preformed.   Future Appointments  Date Time Provider Department Center  08/22/2023 10:00 AM [name], MD [facility] None  11/22/2023  2:30 PM Adela Glimpse, NP [facility] None    Subjective:  Alex Lewis. is a 72 y.o. male who presents for a general follow up. 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  Patient was treated for a RLE DVT in June 2020 on Xarelto x 3 months, had immedicate reoccurrence 07/12/2019, then again  a [place] on 05/28/2021 showed  with age indeterminate non-occlusive DVT involving the right femoral vein, and right popliteal vein. He was referred to Dr. Bertis Ruddy who recommended xarelto, concern for possible thrombophlebitis with high risk for recurrence due to obesity and sedentary lifestyle. Wearing compression hose daily. Doing well on xarelto at this time.  Feels as thought it is becoming harder to afford.    He has a diagnosis of depression in remission off of medication (has taken wellbutrin XL 150 mg and celexa 20 mg in the past).  Denies depression, reports doing better with lots of prayer.   BMI is Body mass index is 32.37 kg/m., he has not been working on diet and exercise.  Walking daily.  Monitoring intake of foods, lessening sugar and carbs.  Wt Readings from Last 3 Encounters:  05/20/23 225 lb 9.6 oz (102.3 kg)  04/06/23 245 lb 6.4 oz (111.3 kg)  03/10/23 255 lb 9.6 oz (115.9 kg)   His blood pressure has been controlled at home, today their BP is BP: 118/76 He does not workout. He denies chest pain, shortness of breath, dizziness.   He is not on cholesterol medication. His cholesterol is at goal. The cholesterol last visit was:   Lab Results  Component Value Date   CHOL 182 02/17/2023   HDL 48 02/17/2023   LDLCALC 112 (H) 02/17/2023   TRIG 112 02/17/2023   CHOLHDL 3.8 02/17/2023   He has not been working on diet and exercise for prediabetes, and denies polydipsia and polyuria. Last A1C in the office was:  Lab Results  Component Value Date   HGBA1C 6.8 (H) 02/17/2023   Last GFR Lab Results  Component Value Date   EGFR 51 (L) 02/17/2023   Patient is on Vitamin D supplement.   Lab Results  Component Value Date   VD25OH 93 02/17/2023      Medication Review:  Current Outpatient Medications (Endocrine & Metabolic):    tirzepatide (MOUNJARO) 7.5 MG/0.5ML Pen, Inject  1 pen (7.5 mg)  into Skin  every 7 days  for Diabetes  (Dx:  e11.29)  Current Outpatient Medications (Cardiovascular):     bisoprolol-hydrochlorothiazide (ZIAC) 5-6.25 MG tablet, TAKE 1 TABLET DAILY FOR BLOOD PRESSURE PATIENT KNOWS TO TAKE BY MOUTH  Current Outpatient Medications (Respiratory):    fluticasone (FLONASE) 50 MCG/ACT nasal spray, SPRAY 2 SPRAYS INTO EACH NOSTRIL EVERY DAY   loratadine (CLARITIN) 10 MG tablet, Take 10 mg by mouth daily.   montelukast (SINGULAIR) 10 MG tablet, TAKE 1 TABLET BY MOUTH DAILY FOR ALLERGIES    Current Outpatient Medications (Other):    B Complex-C (SUPER B COMPLEX PO), Take 1 tablet by mouth daily.   Calcium Carbonate Antacid (TUMS PO), Take by mouth.   Cholecalciferol (VITAMIN D-3) 5000 UNITS TABS, Take 10,000 Units by mouth daily.  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  Screening Tests Immunization History  Administered Date(s) Administered   DTaP 01/26/2000  Fluad Quad(high Dose 65+) 07/22/2021, 08/02/2022   Influenza Whole 06/22/2013   Influenza, High Dose Seasonal PF 06/27/2015, 06/27/2017, 06/20/2018, 05/13/2019, 06/20/2020, 05/09/2023   Influenza-Unspecified 07/13/2014, 06/26/2016, 05/13/2019   PFIZER Comirnaty(Gray Top)Covid-19 Tri-Sucrose Vaccine 01/24/2021, 01/04/2023   PFIZER(Purple Top)SARS-COV-2 Vaccination 11/03/2019, 11/24/2019, 06/20/2020   PPD Test 05/29/2014, 07/30/2015   Pfizer Covid-19 Vaccine Bivalent Booster 77yrs & up  07/22/2021   Pneumococcal Conjugate-13 05/13/2017   Pneumococcal Polysaccharide-23 06/20/2018   Tdap 09/06/2014   Zoster Recombinant(Shingrix) 01/04/2023, 03/18/2023   Zoster, Live 04/22/2013   Health Maintenance  Topic Date Due   Hepatitis C Screening  Never done   Colonoscopy  02/15/2018   COVID-19 Vaccine (7 - 2023-24 season) 03/01/2023   Diabetic kidney evaluation - Urine ACR  08/17/2023   Medicare Annual Wellness (AWV)  11/17/2023   Diabetic kidney evaluation - eGFR measurement  02/17/2024   DTaP/Tdap/Td (3 - Td or Tdap) 09/06/2024   Pneumonia Vaccine 45+ Years old  Completed   INFLUENZA VACCINE  Completed   Zoster Vaccines- Shingrix  Completed   HPV VACCINES  Aged Out    Patient Care Team: [name], MD as PCP - General (Internal Medicine)  Surgical: He  has a past surgical history that includes Knee surgery (Right, 1977); Flexible sigmoidoscopy (1992); and Tonsillectomy and adenoidectomy (1960). Family His family history includes Cancer in his father; Diabetes in his brother and father. Social history  He reports that he quit smoking about 47 years ago. His smoking use included cigarettes. He has never used smokeless tobacco. He reports current alcohol use. He reports that he does not use drugs.  Objective:   Today's Vitals   05/20/23 0930  BP: 118/76  Pulse: 72  Resp: 17  Temp: 97.9 F (36.6 C)  SpO2: 99%  Weight: 225 lb 9.6 oz (102.3 kg)  Height: 5\' 10"  (1.778 m)   Body mass index is 32.37 kg/m.  General appearance: alert, no distress, WD/WN, male HEENT: normocephalic, sclerae anicteric, TMs pearly, nares patent, no discharge or erythema, pharynx normal Oral cavity: MMM, no lesions Neck: supple, no lymphadenopathy, no thyromegaly, no masses Heart: RRR, normal S1, S2, no murmurs Lungs: CTA bilaterally, no wheezes, rhonchi, or rales Abdomen: +bs, soft, non tender, non distended, no masses, no hepatomegaly, no splenomegaly Musculoskeletal:  nontender, no swelling, no obvious deformity Extremities: no edema, no cyanosis, no clubbing Pulses: 2+ symmetric, upper and lower extremities, normal cap refill Neurological: alert, oriented x 3, CN2-12 intact, strength normal upper extremities and lower extremities, sensation normal throughout, DTRs 2+ throughout, no cerebellar signs, gait normal Psychiatric: normal affect, behavior normal, pleasant    Rylynn Kobs, NP   05/20/2023

## 2023-05-21 ENCOUNTER — Other Ambulatory Visit: Payer: Self-pay | Admitting: Internal Medicine

## 2023-05-21 DIAGNOSIS — E1122 Type 2 diabetes mellitus with diabetic chronic kidney disease: Secondary | ICD-10-CM

## 2023-05-21 LAB — LIPID PANEL
Cholesterol: 141 mg/dL (ref <200)
HDL: 38 mg/dL — ABNORMAL LOW (ref >=40)
LDL Cholesterol (Calc): 82 mg/dL
Non-HDL Cholesterol (Calc): 103 mg/dL (ref <130)
Total CHOL/HDL Ratio: 3.7 (calc) (ref <5.0)
Triglycerides: 110 mg/dL (ref <150)

## 2023-05-21 LAB — COMPLETE METABOLIC PANEL WITH GFR
AG Ratio: 1.8 (calc) (ref 1.0–2.5)
ALT: 17 U/L (ref 9–46)
AST: 17 U/L (ref 10–35)
Albumin: 4.2 g/dL (ref 3.6–5.1)
Alkaline phosphatase (APISO): 56 U/L (ref 35–144)
BUN/Creatinine Ratio: 11 (calc) (ref 6–22)
BUN: 15 mg/dL (ref 7–25)
CO2: 27 mmol/L (ref 20–32)
Calcium: 9.6 mg/dL (ref 8.6–10.3)
Chloride: 103 mmol/L (ref 98–110)
Creat: 1.38 mg/dL — ABNORMAL HIGH (ref 0.70–1.28)
Globulin: 2.4 g/dL (ref 1.9–3.7)
Glucose, Bld: 106 mg/dL — ABNORMAL HIGH (ref 65–99)
Potassium: 3.5 mmol/L (ref 3.5–5.3)
Sodium: 138 mmol/L (ref 135–146)
Total Bilirubin: 1.4 mg/dL — ABNORMAL HIGH (ref 0.2–1.2)
Total Protein: 6.6 g/dL (ref 6.1–8.1)
eGFR: 54 mL/min/{1.73_m2} — ABNORMAL LOW (ref >=60)

## 2023-05-21 LAB — CBC WITH DIFFERENTIAL/PLATELET
Absolute Monocytes: 563 {cells}/uL (ref 200–950)
Basophils Absolute: 27 {cells}/uL (ref 0–200)
Basophils Relative: 0.4 %
Eosinophils Absolute: 161 {cells}/uL (ref 15–500)
Eosinophils Relative: 2.4 %
HCT: 46.6 % (ref 38.5–50.0)
Hemoglobin: 16.1 g/dL (ref 13.2–17.1)
Lymphs Abs: 1648 {cells}/uL (ref 850–3900)
MCH: 31.4 pg (ref 27.0–33.0)
MCHC: 34.5 g/dL (ref 32.0–36.0)
MCV: 90.8 fL (ref 80.0–100.0)
MPV: 10.8 fL (ref 7.5–12.5)
Monocytes Relative: 8.4 %
Neutro Abs: 4301 {cells}/uL (ref 1500–7800)
Neutrophils Relative %: 64.2 %
Platelets: 221 10*3/uL (ref 140–400)
RBC: 5.13 10*6/uL (ref 4.20–5.80)
RDW: 12.5 % (ref 11.0–15.0)
Total Lymphocyte: 24.6 %
WBC: 6.7 10*3/uL (ref 3.8–10.8)

## 2023-05-21 LAB — HEMOGLOBIN A1C
Hgb A1c MFr Bld: 5.7 %{Hb} — ABNORMAL HIGH (ref <5.7)
Mean Plasma Glucose: 117 mg/dL
eAG (mmol/L): 6.5 mmol/L

## 2023-05-21 MED ORDER — TIRZEPATIDE 10 MG/0.5ML ~~LOC~~ SOAJ: SUBCUTANEOUS | 0 refills | Status: DC

## 2023-08-02 ENCOUNTER — Telehealth: Payer: Self-pay | Admitting: Nurse Practitioner

## 2023-08-02 ENCOUNTER — Other Ambulatory Visit: Payer: Self-pay | Admitting: Nurse Practitioner

## 2023-08-02 DIAGNOSIS — I82401 Acute embolism and thrombosis of unspecified deep veins of right lower extremity: Secondary | ICD-10-CM

## 2023-08-02 NOTE — Telephone Encounter (Signed)
Pt is requesting a refill on Xarelto. On our end it was discontinued by [place] due to insurance not covering it. Pt said he wants to get back on it, even if insurance does not cover it. Does he need to come in for appt?

## 2023-08-08 ENCOUNTER — Encounter: Payer: Self-pay | Admitting: Nurse Practitioner

## 2023-08-08 ENCOUNTER — Other Ambulatory Visit: Payer: Self-pay | Admitting: Nurse Practitioner

## 2023-08-08 DIAGNOSIS — I82401 Acute embolism and thrombosis of unspecified deep veins of right lower extremity: Secondary | ICD-10-CM

## 2023-08-08 MED ORDER — RIVAROXABAN 20 MG PO TABS
ORAL_TABLET | ORAL | 3 refills | Status: DC
Start: 2023-08-08 — End: 2024-04-26

## 2023-08-12 ENCOUNTER — Other Ambulatory Visit: Payer: Self-pay | Admitting: Internal Medicine

## 2023-08-12 DIAGNOSIS — E1122 Type 2 diabetes mellitus with diabetic chronic kidney disease: Secondary | ICD-10-CM

## 2023-08-21 NOTE — Progress Notes (Unsigned)
[facility]   [name], M.D.        Rance Muir, D.NP      Adela Glimpse, D.NP  [facility] [address]  [place], [place]. [address] Telephone [phone] Telefax [phone]  Annual  Screening/Preventative Visit  & Comprehensive Evaluation & Examination   Future Appointments  Date Time Provider Department  08/22/2023                     cpe 10:00 AM [name], MD [facility]  11/22/2023                       wellness  2:30 PM Adela Glimpse, NP [facility]  09/05/2024                     cpe 10:00 AM [name], MD [facility]         This very nice 72 y.o. MWM with  HTN, HLD, T2DM  and Vitamin D Deficiency  presents for a Screening /Preventative Visit & comprehensive evaluation and management of multiple medical co-morbidities.  Patient has Morbid Obesity with hx/o elevated BMI 36+ and consequent HTN, T2_NIDDM  & HLD .         In June 2020 & Sept 2020 , he was treated for 2 separate episodes of DVT of the RLE and has remained on Xarelto.         HTN predates circa 2001.   Patient's BP has been controlled and today's BP is at goal  118/70 . 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  Patient denies any cardiac symptoms as chest pain, palpitations, shortness of breath, dizziness or ankle swelling.       Patient's hyperlipidemia is controlled with diet. Last lipids were at goal:  Lab Results  Component Value Date   CHOL 141 05/20/2023   HDL 38 (L) 05/20/2023   LDLCALC 82 05/20/2023   TRIG 110 05/20/2023   CHOLHDL 3.7 05/20/2023        Patient has Morbid Obesity (BMI 36+)  & prediabetes (A1c 6.2% / 2011) and then  transitioned to T2_NIDDM with A1c 6.5% in Feb & 6.8% in May of this year (2024).   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  Patient is on [facility] and has lost 28 # from June to now. Patient denies reactive hypoglycemic symptoms, visual blurring, diabetic polys or  paresthesias. Last A1c was near goal :    Lab Results  Component Value Date   HGBA1C 5.7 (H) 05/20/2023   Wt Readings from Last 3 Encounters:  08/22/23 217 lb (98.4 kg)  05/20/23 225 lb 9.6 oz (102.3 kg)  04/06/23 245 lb 6.4 oz (111.3 kg)         Finally, patient has history of Vitamin D Deficiency ("16" /2008) and last vitamin D was at goal :   Lab Results  Component Value Date   VD25OH 93 02/17/2023       Current Outpatient Medications  Medication Instructions   SUPER B COMPLEX ) 1 tablet Daily   bisoprolol-hctz 5-6.25 MG tablet TAKE 1 TABLET DAILY    TUMS  Oral   FLONASE  nasal spray 2 SPRAYS EACH NOSTRIL EVERY DAY   loratadine 10 mg Daily   montelukast 10 MG tablet TAKE 1 TABLET DAILY    rivaroxaban  20 MG TABS tablet Take  1 tablet  Daily  MOUNJARO 10 MG  INJECT 1 PEN (10 mg)  EVERY 7 DAYS   Vitamin D  10,000 Units Daily     Allergies  Allergen Reactions   Penicillins     REACTION: unknown     Past Medical History:  Diagnosis Date   Allergic rhinitis    BPH (benign prostatic hyperplasia)    GERD (gastroesophageal reflux disease)    Hypogonadism, male    Kidney stones    Morbid obesity (HCC)    Pre-diabetes    Vitamin D deficiency      Health Maintenance  Topic Date Due   Hepatitis C Screening  Never done   Zoster Vaccines- Shingrix (1 of 2) Never done   COLONOSCOPY  02/15/2018   TETANUS/TDAP  09/06/2024   Pneumonia Vaccine 87+ Years old  Completed   INFLUENZA VACCINE  Completed   COVID-19 Vaccine  Completed   HPV VACCINES  Aged Out     Immunization History  Administered Date(s) Administered   DTaP 01/26/2000   Fluad Quad(high Dose 65+) 07/22/2021   Influenza Whole 06/22/2013   Influenza, High Dose 06/20/2018, 05/13/2019, 06/20/2020   Influenza 07/13/2014, 06/26/2016, 05/13/2019   PFIZER Covid-19 Tri-Sucrose Vacc 01/24/2021   PFIZER SARS-COV-2 Vacc 11/03/2019, 11/24/2019, 06/20/2020   PPD Test 05/29/2014, 07/30/2015   Pfizer Covid-19  Vacc Bivalent Booster  07/22/2021   Pneumococcal -13 05/13/2017   Pneumococcal -23 06/20/2018   Tdap 09/06/2014   Zoster, Live 04/22/2013     Last Colon - 02/16/2008 - Dr Marina Goodell - 10 yr f/u overdue May 2019 - patient aware  Patient was referred back to Dr Marina Goodell 08/15/2022  and never followed through.    Past Surgical History:  Procedure Laterality Date   FLEXIBLE SIGMOIDOSCOPY  1992   KNEE SURGERY Right 1977   TONSILLECTOMY AND ADENOIDECTOMY  1960     Family History  Problem Relation Age of Onset   Cancer Father        lung   Diabetes Father    Diabetes Brother      Social History   Tobacco Use   Smoking status: Former    Types: Cigarettes    Quit date: 09/28/1975    Years since quitting: 45.9   Smokeless tobacco: Never  Substance Use Topics   Alcohol use: Yes    Comment: occasional   Drug use: No      ROS Constitutional: Denies fever, chills, weight loss/gain, headaches, insomnia,  night sweats or change in appetite. Does c/o fatigue. Eyes: Denies redness, blurred vision, diplopia, discharge, itchy or watery eyes.  ENT: Denies discharge, congestion, post nasal drip, epistaxis, sore throat, earache, hearing loss, dental pain, Tinnitus, Vertigo, Sinus pain or snoring.  Cardio: Denies chest pain, palpitations, irregular heartbeat, syncope, dyspnea, diaphoresis, orthopnea, PND, claudication or edema Respiratory: denies cough, dyspnea, DOE, pleurisy, hoarseness, laryngitis or wheezing.  Gastrointestinal: Denies dysphagia, heartburn, reflux, water brash, pain, cramps, nausea, vomiting, bloating, diarrhea, constipation, hematemesis, melena, hematochezia, jaundice or hemorrhoids Genitourinary: Denies dysuria, frequency, urgency, nocturia, hesitancy, discharge, hematuria or flank pain Musculoskeletal: Denies arthralgia, myalgia, stiffness, Jt. Swelling, pain, limp or strain/sprain. Denies Falls. Skin: Denies puritis, rash, hives, warts, acne, eczema or change in skin  lesion Neuro: No weakness, tremor, incoordination, spasms, paresthesia or pain Psychiatric: Denies confusion, memory loss or sensory loss. Denies Depression. Endocrine: Denies change in weight, skin, hair change, nocturia, and paresthesia, diabetic polys, visual blurring or hyper / hypo glycemic episodes.  Heme/Lymph: No excessive bleeding, bruising or enlarged lymph nodes.   Physical  Exam  BP 118/70   Pulse (!) 54   Temp 97.9 F (36.6 C)   Resp 16   Ht 5\' 10"  (1.778 m)   Wt 217 lb (98.4 kg)   SpO2 99%   BMI 31.14 kg/m   General Appearance: Well nourished and well groomed and in no apparent distress.  Eyes: PERRLA, EOMs, conjunctiva no swelling or erythema, normal fundi and vessels. Sinuses: No frontal/maxillary tenderness ENT/Mouth: EACs patent / TMs  nl. Nares clear without erythema, swelling, mucoid exudates. Oral hygiene is good. No erythema, swelling, or exudate. Tongue normal, non-obstructing. Tonsils not swollen or erythematous. Hearing normal.  Neck: Supple, thyroid not palpable. No bruits, nodes or JVD. Respiratory: Respiratory effort normal.  BS equal and clear bilateral without rales, rhonci, wheezing or stridor. Cardio: Heart sounds are normal with regular rate and rhythm and no murmurs, rubs or gallops. Peripheral pulses are normal and equal bilaterally without edema. No aortic or femoral bruits. Chest: symmetric with normal excursions and percussion.  Abdomen: Soft, rotund with Nl bowel sounds. Nontender, no guarding, rebound, hernias, masses, or organomegaly.  Lymphatics: Non tender without lymphadenopathy.  Musculoskeletal: Full ROM all peripheral extremities, joint stability, 5/5 strength, and normal gait. Skin: Warm and dry without rashes, lesions, cyanosis, clubbing or  ecchymosis.  Neuro: Cranial nerves intact, reflexes equal bilaterally. Normal muscle tone, no cerebellar symptoms. Sensation intact.  Pysch: Alert and oriented X 3 with normal affect, insight and  judgment appropriate.   Assessment and Plan  1. Annual Preventative/Screening Exam    2. Class 2 severe obesity due to excess calories with serious comorbidity and body mass index (BMI) of 35.0 to 35.9 in adult (HCC)  - TSH   3. Essential hypertension  - EKG 12-Lead - [place], RETROPERITNL ABD,  LTD - Urinalysis, Routine w reflex microscopic - Microalbumin / creatinine urine ratio - CBC with Differential/Platelet - COMPLETE METABOLIC PANEL WITH GFR - Magnesium - TSH   4. Hyperlipidemia associated with type 2 diabetes mellitus (HCC)  - EKG 12-Lead - [place], RETROPERITNL ABD,  LTD - Lipid panel - TSH   5. Type 2 diabetes mellitus with stage 3b chronic kidney disease, without long-term current use of insulin (HCC)  - EKG 12-Lead - [place], RETROPERITNL ABD,  LTD - HM DIABETES FOOT EXAM - PR LOW EXTEMITY NEUR EXAM DOCUM - Hemoglobin A1c - Insulin, random - Parathyroid hormone, intact (no Ca)   6. Vitamin D deficiency  - VITAMIN D 25 Hydroxy    7. Screening for prostate cancer  - PSA   8. BPH with obstruction/lower urinary tract symptoms  - PSA   9. 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 Screening for heart disease  - EKG 12-Lead  11. Screening for AAA (aortic abdominal aneurysm)  - [place], RETROPERITNL ABD,  LTD   12. 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 History of adenomatous polyp of colon  - Ambulatory referral to Gastroenterology   14. DVT, lower extremity, recurrent, right (HCC)   15. Screening for colorectal cancer  - Ambulatory referral to Gastroenterology   16. FH: hypertension  - EKG 12-Lead - [place], RETROPERITNL ABD,  LTD   17. Medication management  - Urinalysis, Routine w reflex microscopic - Microalbumin / creatinine urine ratio - CBC with Differential/Platelet - COMPLETE METABOLIC PANEL WITH GFR - Magnesium - Lipid panel - TSH -  Hemoglobin A1c - Insulin, random - VITAMIN D 25 Hydroxy  Patient was counseled in prudent diet, weight control to achieve/maintain BMI less than 25, BP monitoring, regular exercise and medications as discussed.  Discussed med effects and SE's. Routine screening labs and tests as requested with regular follow-up as recommended. Over 40 minutes of exam, counseling, chart review and high complex critical decision making was performed   Marinus Maw, MD

## 2023-08-21 NOTE — Patient Instructions (Signed)

## 2023-08-22 ENCOUNTER — Ambulatory Visit (INDEPENDENT_AMBULATORY_CARE_PROVIDER_SITE_OTHER): Payer: PPO | Admitting: Internal Medicine

## 2023-08-22 ENCOUNTER — Encounter: Payer: Self-pay | Admitting: Internal Medicine

## 2023-08-22 VITALS — BP 118/70 | HR 54 | Temp 97.9°F | Resp 16 | Ht 70.0 in | Wt 217.0 lb

## 2023-08-22 DIAGNOSIS — E1169 Type 2 diabetes mellitus with other specified complication: Secondary | ICD-10-CM

## 2023-08-22 DIAGNOSIS — I82401 Acute embolism and thrombosis of unspecified deep veins of right lower extremity: Secondary | ICD-10-CM

## 2023-08-22 DIAGNOSIS — I10 Essential (primary) hypertension: Secondary | ICD-10-CM

## 2023-08-22 DIAGNOSIS — I700 Atherosclerosis of aorta: Secondary | ICD-10-CM | POA: Diagnosis not present

## 2023-08-22 DIAGNOSIS — E559 Vitamin D deficiency, unspecified: Secondary | ICD-10-CM

## 2023-08-22 DIAGNOSIS — Z1211 Encounter for screening for malignant neoplasm of colon: Secondary | ICD-10-CM

## 2023-08-22 DIAGNOSIS — D689 Coagulation defect, unspecified: Secondary | ICD-10-CM

## 2023-08-22 DIAGNOSIS — Z136 Encounter for screening for cardiovascular disorders: Secondary | ICD-10-CM | POA: Diagnosis not present

## 2023-08-22 DIAGNOSIS — Z125 Encounter for screening for malignant neoplasm of prostate: Secondary | ICD-10-CM | POA: Diagnosis not present

## 2023-08-22 DIAGNOSIS — E785 Hyperlipidemia, unspecified: Secondary | ICD-10-CM | POA: Diagnosis not present

## 2023-08-22 DIAGNOSIS — Z860101 Personal history of adenomatous and serrated colon polyps: Secondary | ICD-10-CM

## 2023-08-22 DIAGNOSIS — N401 Enlarged prostate with lower urinary tract symptoms: Secondary | ICD-10-CM | POA: Diagnosis not present

## 2023-08-22 DIAGNOSIS — N1832 Chronic kidney disease, stage 3b: Secondary | ICD-10-CM

## 2023-08-22 DIAGNOSIS — Z0000 Encounter for general adult medical examination without abnormal findings: Secondary | ICD-10-CM | POA: Diagnosis not present

## 2023-08-22 DIAGNOSIS — Z79899 Other long term (current) drug therapy: Secondary | ICD-10-CM

## 2023-08-22 DIAGNOSIS — N138 Other obstructive and reflux uropathy: Secondary | ICD-10-CM | POA: Diagnosis not present

## 2023-08-22 DIAGNOSIS — Z6835 Body mass index (BMI) 35.0-35.9, adult: Secondary | ICD-10-CM | POA: Diagnosis not present

## 2023-08-22 DIAGNOSIS — Z8249 Family history of ischemic heart disease and other diseases of the circulatory system: Secondary | ICD-10-CM

## 2023-08-22 DIAGNOSIS — Z0001 Encounter for general adult medical examination with abnormal findings: Secondary | ICD-10-CM

## 2023-08-22 DIAGNOSIS — E66812 Obesity, class 2: Secondary | ICD-10-CM | POA: Diagnosis not present

## 2023-08-22 DIAGNOSIS — T457X1A Poisoning by anticoagulant antagonists, vitamin K and other coagulants, accidental (unintentional), initial encounter: Secondary | ICD-10-CM | POA: Diagnosis not present

## 2023-08-23 LAB — COMPLETE METABOLIC PANEL WITH GFR
AG Ratio: 1.5 (calc) (ref 1.0–2.5)
ALT: 10 U/L (ref 9–46)
AST: 16 U/L (ref 10–35)
Albumin: 4.1 g/dL (ref 3.6–5.1)
Alkaline phosphatase (APISO): 68 U/L (ref 35–144)
BUN/Creatinine Ratio: 8 (calc) (ref 6–22)
BUN: 11 mg/dL (ref 7–25)
CO2: 32 mmol/L (ref 20–32)
Calcium: 9.9 mg/dL (ref 8.6–10.3)
Chloride: 100 mmol/L (ref 98–110)
Creat: 1.35 mg/dL — ABNORMAL HIGH (ref 0.70–1.28)
Globulin: 2.7 g/dL (ref 1.9–3.7)
Glucose, Bld: 108 mg/dL — ABNORMAL HIGH (ref 65–99)
Potassium: 3.9 mmol/L (ref 3.5–5.3)
Sodium: 140 mmol/L (ref 135–146)
Total Bilirubin: 1.2 mg/dL (ref 0.2–1.2)
Total Protein: 6.8 g/dL (ref 6.1–8.1)
eGFR: 56 mL/min/{1.73_m2} — ABNORMAL LOW (ref >=60)

## 2023-08-23 LAB — LIPID PANEL
Cholesterol: 160 mg/dL (ref <200)
HDL: 43 mg/dL (ref >=40)
LDL Cholesterol (Calc): 97 mg/dL
Non-HDL Cholesterol (Calc): 117 mg/dL (ref <130)
Total CHOL/HDL Ratio: 3.7 (calc) (ref <5.0)
Triglycerides: 106 mg/dL (ref <150)

## 2023-08-23 LAB — URINALYSIS, ROUTINE W REFLEX MICROSCOPIC
Bilirubin Urine: NEGATIVE
Glucose, UA: NEGATIVE
Hgb urine dipstick: NEGATIVE
Ketones, ur: NEGATIVE
Leukocytes,Ua: NEGATIVE
Nitrite: NEGATIVE
Protein, ur: NEGATIVE
Specific Gravity, Urine: 1.006 (ref 1.001–1.035)
pH: 7 (ref 5.0–8.0)

## 2023-08-23 LAB — CBC WITH DIFFERENTIAL/PLATELET
Absolute Lymphocytes: 1624 {cells}/uL (ref 850–3900)
Absolute Monocytes: 566 {cells}/uL (ref 200–950)
Basophils Absolute: 39 {cells}/uL (ref 0–200)
Basophils Relative: 0.7 %
Eosinophils Absolute: 174 {cells}/uL (ref 15–500)
Eosinophils Relative: 3.1 %
HCT: 47.2 % (ref 38.5–50.0)
Hemoglobin: 16 g/dL (ref 13.2–17.1)
MCH: 31.6 pg (ref 27.0–33.0)
MCHC: 33.9 g/dL (ref 32.0–36.0)
MCV: 93.3 fL (ref 80.0–100.0)
MPV: 10.2 fL (ref 7.5–12.5)
Monocytes Relative: 10.1 %
Neutro Abs: 3198 {cells}/uL (ref 1500–7800)
Neutrophils Relative %: 57.1 %
Platelets: 238 10*3/uL (ref 140–400)
RBC: 5.06 10*6/uL (ref 4.20–5.80)
RDW: 12.4 % (ref 11.0–15.0)
Total Lymphocyte: 29 %
WBC: 5.6 10*3/uL (ref 3.8–10.8)

## 2023-08-23 LAB — VITAMIN D 25 HYDROXY (VIT D DEFICIENCY, FRACTURES): Vit D, 25-Hydroxy: 101 ng/mL — ABNORMAL HIGH (ref 30–100)

## 2023-08-23 LAB — MICROALBUMIN / CREATININE URINE RATIO
Creatinine, Urine: 79 mg/dL (ref 20–320)
Microalb Creat Ratio: 9 mg/g{creat} (ref <30)
Microalb, Ur: 0.7 mg/dL

## 2023-08-23 LAB — PSA: PSA: 0.89 ng/mL (ref <=4.00)

## 2023-08-23 LAB — INSULIN, RANDOM: Insulin: 8.4 u[IU]/mL

## 2023-08-23 LAB — MAGNESIUM: Magnesium: 2.1 mg/dL (ref 1.5–2.5)

## 2023-08-23 LAB — HEMOGLOBIN A1C
Hgb A1c MFr Bld: 5.5 %{Hb} (ref <5.7)
Mean Plasma Glucose: 111 mg/dL
eAG (mmol/L): 6.2 mmol/L

## 2023-08-23 LAB — PARATHYROID HORMONE, INTACT (NO CA): PTH: 26 pg/mL (ref 16–77)

## 2023-08-23 LAB — TSH: TSH: 1.24 m[IU]/L (ref 0.40–4.50)

## 2023-08-23 NOTE — Progress Notes (Signed)
[][][][][][][][][][][][][][][][][][][][][][][][][][][][][][][][][][][][][][][][][]][][][][][][][][][][][][][][][][][][][][][][][[][][][][] [][][][][][][][][][][][][][][][][][][][][][][][][][][][][][][][][][][][][][][][][]][][][][][][][][][][][][][][][][][][][][][][][[][][][][] -  Test results slightly outside the reference range are not unusual. If there is anything important, I will review this with you,  otherwise it is considered normal test values.  If you have further questions,  please do not hesitate to contact me at the office or via My Chart.  [] [] [] [] [] [] [] [] [] [] [] [] [] [] [] [] [] [] [] [] [] [] [] [] [] [] [] [] [] [] [] [] [] [] [] [] [] [] [] [] [] ][] [] [] [] [] [] [] [] [] [] [] [] [] [] [] [] [] [] [] [] [] [] [[] [] [] [] []  [] [] [] [] [] [] [] [] [] [] [] [] [] [] [] [] [] [] [] [] [] [] [] [] [] [] [] [] [] [] [] [] [] [] [] [] [] [] [] [] [] ][] [] [] [] [] [] [] [] [] [] [] [] [] [] [] [] [] [] [] [] [] [] [[] [] [] [] []   -   Kidney Functions still Stage 3a & Stable  [] [] [] [] [] [] [] [] [] [] [] [] [] [] [] [] [] [] [] [] [] [] [] [] [] [] [] [] [] [] [] [] [] [] [] [] [] [] [] [] [] ][] [] [] [] [] [] [] [] [] [] [] [] [] [] [] [] [] [] [] [] [] [] [[] [] [] [] []   -   Vitamin D = 101 - Excellent - Please keep dosage same   [] [] [] [] [] [] [] [] [] [] [] [] [] [] [] [] [] [] [] [] [] [] [] [] [] [] [] [] [] [] [] [] [] [] [] [] [] [] [] [] [] ][] [] [] [] [] [] [] [] [] [] [] [] [] [] [] [] [] [] [] [] [] [] [[] [] [] [] []   -   PSA - Normal - No Prostate Cancer  - Great !  [] [] [] [] [] [] [] [] [] [] [] [] [] [] [] [] [] [] [] [] [] [] [] [] [] [] [] [] [] [] [] [] [] [] [] [] [] [] [] [] [] ][] [] [] [] [] [] [] [] [] [] [] [] [] [] [] [] [] [] [] [] [] [] [[] [] [] [] []   -   Chol = 160 -  Excellent   - Very low risk for Heart Attack  / Stroke  [] [] [] [] [] [] [] [] [] [] [] [] [] [] [] [] [] [] [] [] [] [] [] [] [] [] [] [] [] [] [] [] [] [] [] [] [] [] [] [] [] ][] [] [] [] [] [] [] [] [] [] [] [] [] [] [] [] [] [] [] [] [] [] [[] [] [] [] []   -   A1c = 5/5 % - Wonderful - back in Normal Non-Diabetic range  !  KEEP up the [place] job with Weight LOSS  & you'll CURE your Diabetes  !  [] [] [] [] [] [] [] [] [] [] [] [] [] [] [] [] [] [] [] [] [] [] [] [] [] [] [] [] [] [] [] [] [] [] [] [] [] [] [] [] [] ][] [] [] [] [] [] [] [] [] [] [] [] [] [] [] [] [] [] [] [] [] [] [[] [] [] [] []   - PTH  hormone that regulates calcium balance is Normal & OK                                  [] [] [] [] [] [] [] [] [] [] [] [] [] [] [] [] [] [] [] [] [] [] [] [] [] [] [] [] [] [] [] [] [] [] [] [] [] [] [] [] [] ][] [] [] [] [] [] [] [] [] [] [] [] [] [] [] [] [] [] [] [] [] [] [[] [] [] [] []   -   All Else - CBC - Kidneys - Electrolytes - Liver - Magnesium & Thyroid    - all  Normal / OK  [] [] [] [] [] [] [] [] [] [] [] [] [] [] [] [] [] [] [] [] [] [] [] [] [] [] [] [] [] [] [] [] [] [] [] [] [] [] [] [] [] ][] [] [] [] [] [] [] [] [] [] [] [] [] [] [] [] [] [] [] [] [] [] [[] [] [] [] []   -   Excellent Improvement  !   [] [] [] [] [] [] [] [] [] [] [] [] [] [] [] [] [] [] [] [] [] [] [] [] [] [] [] [] [] [] [] [] [] [] [] [] [] [] [] [] [] ][] [] [] [] [] [] [] [] [] [] [] [] [] [] [] [] [] [] [] [] [] [] [[] [] [] [] []

## 2023-09-08 ENCOUNTER — Telehealth: Payer: Self-pay | Admitting: Nurse Practitioner

## 2023-09-08 NOTE — Telephone Encounter (Signed)
Patient called with concerns about insurance. He got a text that told him Medicare denied him for a supplement plan due to pre existing health issues. He said that [place] diagnosed him with stage 3 kidney disease and chronic diabetes according to the message, and that is why he was denied. He did not know about the kidney disease... He is a little concerned. He is more so confused about insurance and if there is anything you can do to help?

## 2023-11-22 ENCOUNTER — Ambulatory Visit: Payer: PPO | Admitting: Nurse Practitioner

## 2023-12-16 DIAGNOSIS — H00021 Hordeolum internum right upper eyelid: Secondary | ICD-10-CM | POA: Diagnosis not present

## 2023-12-23 ENCOUNTER — Ambulatory Visit: Admitting: Family Medicine

## 2023-12-23 VITALS — BP 121/74 | HR 62 | Ht 70.0 in | Wt 237.6 lb

## 2023-12-23 DIAGNOSIS — K219 Gastro-esophageal reflux disease without esophagitis: Secondary | ICD-10-CM | POA: Diagnosis not present

## 2023-12-23 DIAGNOSIS — Z1211 Encounter for screening for malignant neoplasm of colon: Secondary | ICD-10-CM

## 2023-12-23 DIAGNOSIS — M1990 Unspecified osteoarthritis, unspecified site: Secondary | ICD-10-CM | POA: Diagnosis not present

## 2023-12-23 DIAGNOSIS — E119 Type 2 diabetes mellitus without complications: Secondary | ICD-10-CM | POA: Diagnosis not present

## 2023-12-23 LAB — POCT GLYCOSYLATED HEMOGLOBIN (HGB A1C): HbA1c, POC (prediabetic range): 6.1 % (ref 5.7–6.4)

## 2023-12-23 MED ORDER — PANTOPRAZOLE SODIUM 20 MG PO TBEC: 20.0000 mg | DELAYED_RELEASE_TABLET | Freq: Every day | ORAL | 3 refills | Status: AC

## 2023-12-23 MED ORDER — ROSUVASTATIN CALCIUM 20 MG PO TABS: 20.0000 mg | ORAL_TABLET | Freq: Every day | ORAL | 6 refills | Status: DC

## 2023-12-23 NOTE — Progress Notes (Addendum)
 SUBJECTIVE:   CHIEF COMPLAINT / HPI:  Alex Lewis. is a 73 y.o. male presenting to the clinic to establish care with wife accompanying him.  Concerns today:  Diabetes A1c 6.1 today, up from 5.5 in November 2024. Medications: Mounjaro but has not take it in multiple months; has 2 days of nausea with it and some constipation as well, will restart now and still has some at home. Adherence: Started May 2024, stopped October 2024. Eye exam: UTD Foot exam: UTD Microalbumin: Microalbumin/Cr ratio WNL November 2024 Statin: No statin No symptoms of hypoglycemia, polyuria, polydipsia, numbness extremities, foot ulcers/trauma Exercise: Walks ~10-15 miles a week. Diet: Eats lots of sugary cereals and candies.  Drinks sweet tea.  R shoulder pain/R knee pain Has pain in R knee sometimes with walking. As he "warms up," throughout the day, it feels better. Painful to raise R arm, but able to raise.  GERD Cannot lie flat, experiences chest burning.  Denies PND, dyspnea with exertion. Lots of oily foods will cause burning. Lots of reflux, "eats Tums like candy." Lots of burning and indigestion with spicy foods. Denies dysphagia/sensation of food catching in throat. No nausea/vomiting or regurgitation of foods. No recent weight loss, in fact patient has gained some weight off Mounjaro.  PERTINENT PMH / PSH:  Medical History Allergies: Penicillins Medical diagnoses: CKD stage III, T2DM, HTN, GERD, HLD Surgical hx: R knee tendon repair, tonsillectomy Family hx: Lung cancer in father, diabetes in brother and father  Health Maintenance Flu vaccine: UTD Colonoscopy: Due Low dose lung CT: Not indicated  Social History Last went to doctor November 2024 Occupation: Retired Lives with: Wife Smoking/Vaping: None Alcohol: 2 drinks a week maximum Marijuana/ilicit substances: None Exercise: Walks ~10-15 miles a week, leisurely pace.   OBJECTIVE:   BP 121/74   Pulse 62   Ht 5\' 10"   (1.778 m)   Wt 237 lb 9.6 oz (107.8 kg)   SpO2 96%   BMI 34.09 kg/m   General: Age-appropriate, resting comfortably in chair, NAD, alert and at baseline. Cardiovascular: Regular rate and rhythm. Normal S1/S2. No murmurs, rubs, or gallops appreciated. 2+ radial pulses. Pulmonary: Clear bilaterally to ascultation. No wheezes, crackles, or rhonchi. Normal WOB on room air. Abdominal: No tenderness to deep or light palpation. No rebound or guarding. Mildly distended abdomen. Normoactive bowel sounds. MSK: Negative empty can, positve Hawkin's R side.  Full ROM of bilateral shoulders, but pain with abduction >95 degrees of R arm.  R knee with well healed medial scar, normal ROM.  Both R knee and shoulder without bony stepoffs or protrusions on palpation. Extremities: Trace peripheral edema bilaterally. Capillary refill <2 seconds.   ASSESSMENT/PLAN:   Assessment & Plan Diabetes mellitus without complication (HCC) A1c at goal <7 today, though with uptrend off Mounjaro.  History of CKD III, but UTD on microalbumin.  May benefit from SGLT2i in future if needed. -Patient plans to restart [facility], discussed starting at lower dose, but patient would like to start at 10 mg dose again; instructed patient to call back if nausea is severe and will start lower dose -Start rosuvastatin 20 mg today -Gave patient goal to stop drinking sweet tea, reduce intake of sweets -Eliminate alcohol intake -Discuss ophthalmology exam next visit Gastroesophageal reflux disease, unspecified whether esophagitis present Severe GERD symptoms, may need EGD in the future.  No red flag symptoms, but given chronicity will plan to follow up and likely refer to GI if not improving next time patient is seen. -  Start pantoprazole 20 mg daily, can titrate up if needed Osteoarthritis, unspecified osteoarthritis type, unspecified site Likely osteoarthritis of right knee and right shoulder.  Possible additional right shoulder tendon  impingement.  Moderate pain with activity, discussed physical therapy and patient declined. -Voltaren gel as needed -Lidocaine patches 12 hours daily -Exercises provided in discharge paperwork, encouraged pursuing physical therapy if not improving Colon cancer screening Due for colonoscopy. -Ambulatory referral to GI  Follow-up in 6 months.  Astella Desir Sharion Dove, MD [facility]

## 2023-12-23 NOTE — Assessment & Plan Note (Signed)
 Likely osteoarthritis of right knee and right shoulder.  Possible additional right shoulder tendon impingement.  Moderate pain with activity, discussed physical therapy and patient declined. -Voltaren gel as needed -Lidocaine patches 12 hours daily -Exercises provided in discharge paperwork, encouraged pursuing physical therapy if not improving

## 2023-12-23 NOTE — Assessment & Plan Note (Signed)
 A1c at goal <7 today, though with uptrend off Mounjaro.  History of CKD III, but UTD on microalbumin.  May benefit from SGLT2i in future if needed. -Patient plans to restart [facility], discussed starting at lower dose, but patient would like to start at 10 mg dose again; instructed patient to call back if nausea is severe and will start lower dose -Start rosuvastatin 20 mg today -Gave patient goal to stop drinking sweet tea, reduce intake of sweets -Eliminate alcohol intake -Discuss ophthalmology exam next visit

## 2023-12-23 NOTE — Assessment & Plan Note (Addendum)
 Severe GERD symptoms, may need EGD in the future.  No red flag symptoms, but given chronicity will plan to follow up and likely refer to GI if not improving next time patient is seen. -Start pantoprazole 20 mg daily, can titrate up if needed

## 2023-12-23 NOTE — Patient Instructions (Addendum)
 It was great to see you today! Thank you for choosing [facility] for your primary care.  Today we addressed: Prediabetes Your A1c is up a bit.  You can restart your Mounjaro at the 10 mg dose, if you are having a lot of nausea, please call and we can reduce the dose. Please stop sweet tea very sugary sweets.  Will help with sleep as well. Please start the rosuvastatin 20 mg daily.  GERD Please start the pantoprazole daily.  Avoid really spicy foods.  Shoulder and knee pain I think you have osteoarthritis and a possible shoulder tendon impingement. Please use Voltaren gel multiple times if needed on your shoulder and knee. Try lidocaine patches for 12 hours on/12 hours off. I am attaching a handout with some exercise.  You will get a call about your colonoscopy, please schedule that. Please stop drinking alcohol altogether.  You will get a MyChart message or a letter if results are normal. Otherwise, you will get a call from [place].  You should return to our clinic 6 months.  Thank you for coming to see [place] at [facility] and for the opportunity to care for you! Alex Nauta, MD 12/23/2023, 4:07 PM

## 2024-02-02 ENCOUNTER — Telehealth: Payer: Self-pay | Admitting: Family Medicine

## 2024-02-02 NOTE — Telephone Encounter (Signed)
 Patient's wife dropped off DMV dorm to be completed. Last DOS was /28/25. Placed in [employer].

## 2024-02-02 NOTE — Telephone Encounter (Signed)
 Reviewed form. Placed in PCP's box for completion.  Drusilla Kanner, CMA

## 2024-02-06 NOTE — Telephone Encounter (Signed)
 Form placed up front for pick up.   Copy made for batch scanning.   Patient aware.

## 2024-02-21 ENCOUNTER — Ambulatory Visit: Payer: PPO | Admitting: Internal Medicine

## 2024-04-26 ENCOUNTER — Other Ambulatory Visit: Payer: Self-pay

## 2024-04-26 DIAGNOSIS — I82401 Acute embolism and thrombosis of unspecified deep veins of right lower extremity: Secondary | ICD-10-CM

## 2024-04-26 MED ORDER — RIVAROXABAN 20 MG PO TABS: ORAL_TABLET | ORAL | 3 refills | Status: AC

## 2024-05-03 ENCOUNTER — Other Ambulatory Visit: Payer: Self-pay | Admitting: Nurse Practitioner

## 2024-05-03 DIAGNOSIS — I10 Essential (primary) hypertension: Secondary | ICD-10-CM

## 2024-05-21 ENCOUNTER — Other Ambulatory Visit: Payer: Self-pay

## 2024-05-21 DIAGNOSIS — I10 Essential (primary) hypertension: Secondary | ICD-10-CM

## 2024-05-21 MED ORDER — BISOPROLOL-HYDROCHLOROTHIAZIDE 5-6.25 MG PO TABS: ORAL_TABLET | ORAL | 3 refills | Status: AC

## 2024-05-23 ENCOUNTER — Ambulatory Visit: Payer: PPO | Admitting: Nurse Practitioner

## 2024-06-17 ENCOUNTER — Other Ambulatory Visit: Payer: Self-pay | Admitting: Family Medicine

## 2024-06-17 DIAGNOSIS — E119 Type 2 diabetes mellitus without complications: Secondary | ICD-10-CM

## 2024-08-20 ENCOUNTER — Encounter: Payer: Self-pay | Admitting: Family Medicine

## 2024-08-20 ENCOUNTER — Ambulatory Visit

## 2024-08-20 VITALS — Ht 70.0 in | Wt 239.4 lb

## 2024-08-20 DIAGNOSIS — H269 Unspecified cataract: Secondary | ICD-10-CM | POA: Insufficient documentation

## 2024-08-20 DIAGNOSIS — Z0000 Encounter for general adult medical examination without abnormal findings: Secondary | ICD-10-CM

## 2024-08-20 NOTE — Progress Notes (Signed)
 I connected with  Alex E Geier Jr. on 08/20/24 by a audio enabled telemedicine application and verified that I am speaking with the correct person using two identifiers.  Patient Location: Home  Provider Location: Home Office  Persons Participating in Visit: Wife and patient was present during visit.  I discussed the limitations of evaluation and management by telemedicine. The patient expressed understanding and agreed to proceed.   Vital Signs: Because this visit was a virtual/telehealth visit, some criteria may be missing or patient reported. Any vitals not documented were not able to be obtained and vitals that have been documented are patient reported.     Chief Complaint  Patient presents with   Medicare Wellness    SUBSEQUENT     Subjective:   Alex Lewis. is a 73 y.o. male who presents for a Medicare Annual Wellness Visit.  Allergies (verified) Penicillins   History: Past Medical History:  Diagnosis Date   Allergic rhinitis    BPH (benign prostatic hyperplasia)    GERD (gastroesophageal reflux disease)    Hypogonadism, male    Kidney stones    Morbid obesity (HCC)    Pre-diabetes    Vitamin D  deficiency    Past Surgical History:  Procedure Laterality Date   FLEXIBLE SIGMOIDOSCOPY  1992   KNEE SURGERY Right 1977   TONSILLECTOMY AND ADENOIDECTOMY  1960   Family History  Problem Relation Age of Onset   Cancer Father        lung   Diabetes Father    Diabetes Brother    Social History   Occupational History   Not on file  Tobacco Use   Smoking status: Former    Current packs/day: 0.00    Types: Cigarettes    Quit date: 09/28/1975    Years since quitting: 48.9   Smokeless tobacco: Never  Substance and Sexual Activity   Alcohol use: Yes    Comment: occasional   Drug use: No   Sexual activity: Not on file   Tobacco Counseling Counseling given: Not Answered  SDOH Screenings   Food Insecurity: No Food Insecurity (08/20/2024)  Housing:  Low Risk  (08/20/2024)  Transportation Needs: No Transportation Needs (08/20/2024)  Utilities: Not At Risk (08/20/2024)  Alcohol Screen: Low Risk  (12/23/2023)  Depression (PHQ2-9): Low Risk  (08/20/2024)  Financial Resource Strain: Low Risk  (08/17/2024)  Physical Activity: Sufficiently Active (08/20/2024)  Social Connections: Moderately Isolated (08/20/2024)  Stress: No Stress Concern Present (08/20/2024)  Tobacco Use: Medium Risk (08/20/2024)  Health Literacy: Adequate Health Literacy (08/20/2024)   See flowsheets for full screening details  Depression Screen PHQ 2 & 9 Depression Scale- Over the past 2 weeks, how often have you been bothered by any of the following problems? Little interest or pleasure in doing things: 0 Feeling down, depressed, or hopeless (PHQ Adolescent also includes...irritable): 0 PHQ-2 Total Score: 0 Trouble falling or staying asleep, or sleeping too much: 0 Feeling tired or having little energy: 0 Poor appetite or overeating (PHQ Adolescent also includes...weight loss): 0 Feeling bad about yourself - or that you are a failure or have let yourself or your family down: 0 Trouble concentrating on things, such as reading the newspaper or watching television (PHQ Adolescent also includes...like school work): 0 Moving or speaking so slowly that other people could have noticed. Or the opposite - being so fidgety or restless that you have been moving around a lot more than usual: 0 Thoughts that you would be better off dead,  or of hurting yourself in some way: 0 PHQ-9 Total Score: 0 If you checked off any problems, how difficult have these problems made it for you to do your work, take care of things at home, or get along with other people?: Not difficult at all  Depression Treatment Depression Interventions/Treatment : EYV7-0 Score <4 Follow-up Not Indicated     Goals Addressed             This Visit's Progress    08/20/2024: To eat healthier and to get down  to 220 pounds.         Visit info / Clinical Intake: Medicare Wellness Visit Type:: Subsequent Annual Wellness Visit Persons participating in visit:: patient Medicare Wellness Visit Mode:: Telephone If telephone:: video declined Because this visit was a virtual/telehealth visit:: pt reported vitals If Telephone or Video please confirm:: I connected with the patient using audio enabled telemedicine application and verified that I am speaking with the correct person using two identifiers; I discussed the limitations of evaluation and management by telemedicine; The patient expressed understanding and agreed to proceed Patient Location:: HOME Provider Location:: HOME OFFICE Information given by:: patient (WIFE) Interpreter Needed?: No Pre-visit prep was completed: yes AWV questionnaire completed by patient prior to visit?: yes Date:: 08/17/24 Living arrangements:: lives with spouse/significant other Patient's Overall Health Status Rating: very good Typical amount of pain: none Does pain affect daily life?: no Are you currently prescribed opioids?: no  Dietary Habits and Nutritional Risks How many meals a day?: (!) 1 (1 MAJOR MEAL, SNACKS, [place]  LOW CALORIE TEA) Eats fruit and vegetables daily?: yes Most meals are obtained by: preparing own meals; eating out In the last 2 weeks, have you had any of the following?: none Diabetic:: (!) yes (NO MEDICATIONS) Any non-healing wounds?: no How often do you check your BS?: 0 Would you like to be referred to a Nutritionist or for Diabetic Management? : no  Functional Status Activities of Daily Living (to include ambulation/medication): Independent Ambulation: Independent Medication Administration: Independent Home Management: Independent Manage your own finances?: yes Primary transportation is: driving Concerns about vision?: no *vision screening is required for WTM* Concerns about hearing?: no  Fall Screening Falls in the past  year?: 0 Number of falls in past year: 0 Was there an injury with Fall?: 0 Fall Risk Category Calculator: 0 Patient Fall Risk Level: Low Fall Risk  Fall Risk Patient at Risk for Falls Due to: No Fall Risks Fall risk Follow up: Falls evaluation completed; Education provided  Home and Transportation Safety: All rugs have non-skid backing?: N/A, no rugs All stairs or steps have railings?: N/A, no stairs Grab bars in the bathtub or shower?: (!) no (VERY CAREFUL) Have non-skid surface in bathtub or shower?: yes Good home lighting?: yes Regular seat belt use?: yes Hospital stays in the last year:: no  Cognitive Assessment Difficulty concentrating, remembering, or making decisions? : no Will 6CIT or Mini Cog be Completed: no 6CIT or Mini Cog Declined: patient alert, oriented, able to answer questions appropriately and recall recent events  Advance Directives (For Healthcare) Does Patient Have a Medical Advance Directive?: Yes Does patient want to make changes to medical advance directive?: No - Patient declined Type of Advance Directive: Healthcare Power of [place]; Living will Copy of Healthcare Power of Attorney in Chart?: No - copy requested Copy of Living Will in Chart?: No - copy requested  Reviewed/Updated  Reviewed/Updated: Reviewed All (Medical, Surgical, Family, Medications, Allergies, Care Teams, Patient Goals)  Objective:    Today's Vitals   08/20/24 1426  Weight: 239 lb 6.4 oz (108.6 kg)  Height: 5' 10 (1.778 m)  PainSc: 0-No pain   Body mass index is 34.35 kg/m.  Current Medications (verified) Outpatient Encounter Medications as of 08/20/2024  Medication Sig   B Complex-C (SUPER B COMPLEX PO) Take 1 tablet by mouth daily.   bisoprolol -hydrochlorothiazide  (ZIAC ) 5-6.25 MG tablet TAKE 1 TABLET DAILY FOR BLOOD PRESSURE PATIENT KNOWS TO TAKE BY MOUTH   Calcium  Carbonate Antacid (TUMS PO) Take by mouth.   Cholecalciferol (VITAMIN D -3) 5000 UNITS TABS  Take 10,000 Units by mouth daily.   fluticasone  (FLONASE ) 50 MCG/ACT nasal spray SPRAY 2 SPRAYS INTO EACH NOSTRIL EVERY DAY   loratadine (CLARITIN) 10 MG tablet Take 10 mg by mouth daily.   montelukast  (SINGULAIR ) 10 MG tablet TAKE 1 TABLET BY MOUTH DAILY FOR ALLERGIES   pantoprazole  (PROTONIX ) 20 MG tablet Take 1 tablet (20 mg total) by mouth daily.   rivaroxaban  (XARELTO ) 20 MG TABS tablet Take  1 tablet  Daily for A.Fib & to Prevent Blood Clots  ( Dx:  i48.91 and  i82.401 )   rosuvastatin  (CRESTOR ) 20 MG tablet TAKE 1 TABLET BY MOUTH EVERY DAY   Zinc 50 MG TABS    [DISCONTINUED] tirzepatide  (MOUNJARO ) 10 MG/0.5ML Pen INJECT 1 PEN ( 10 MG ) INTO SKIN EVERY 7 DAYS ( DX: E11.29)   No facility-administered encounter medications on file as of 08/20/2024.   Hearing/Vision screen Hearing Screening - Comments:: Denies hearing difficulties.  Vision Screening - Comments:: Wears rx glasses - up to date with routine eye exams with Jon B. Scott, OD.  Immunizations and Health Maintenance Health Maintenance  Topic Date Due   OPHTHALMOLOGY EXAM  Never done   Hepatitis C Screening  Never done   Colonoscopy  02/15/2018   Influenza Vaccine  04/27/2024   COVID-19 Vaccine (7 - 2025-26 season) 05/28/2024   HEMOGLOBIN A1C  06/24/2024   FOOT EXAM  08/20/2024   Diabetic kidney evaluation - eGFR measurement  08/21/2024   Diabetic kidney evaluation - Urine ACR  08/21/2024   DTaP/Tdap/Td (3 - Td or Tdap) 09/06/2024   Medicare Annual Wellness (AWV)  08/20/2025   Pneumococcal Vaccine: 50+ Years  Completed   Zoster Vaccines- Shingrix  Completed   Meningococcal B Vaccine  Aged Out        Assessment/Plan:  This is a routine wellness examination for [place].  Patient Care Team: Toma Matas, MD as PCP - General (Family Medicine) Glendia Simmonds, OD as Referring Physician (Optometry)  I have personally reviewed and noted the following in the patient's chart:   Medical and social history Use of alcohol,  tobacco or illicit drugs  Current medications and supplements including opioid prescriptions. Functional ability and status Nutritional status Physical activity Advanced directives List of other physicians Hospitalizations, surgeries, and ER visits in previous 12 months Vitals Screenings to include cognitive, depression, and falls Referrals and appointments  No orders of the defined types were placed in this encounter.  In addition, I have reviewed and discussed with patient certain preventive protocols, quality metrics, and best practice recommendations. A written personalized care plan for preventive services as well as general preventive health recommendations were provided to patient.   Roz LOISE Fuller, LPN   [id number]   Return in 1 year (on 08/20/2025).  After Visit Summary: (MyChart) Due to this being a telephonic visit, the after visit summary with patients personalized plan was offered to patient  via MyChart   Nurse Notes: Patient is aware of current care gaps.  [facility] was verified and chart updated. Nurse requested eye exam report from Dr. Thom Hamilton.

## 2024-08-20 NOTE — Patient Instructions (Signed)
 Mr. Alex Lewis,  Thank you for taking the time for your Medicare Wellness Visit. I appreciate your continued commitment to your health goals. Please review the care plan we discussed, and feel free to reach out if I can assist you further.  Please note that Annual Wellness Visits do not include a physical exam. Some assessments may be limited, especially if the visit was conducted virtually. If needed, we may recommend an in-person follow-up with your provider.  Ongoing Care Seeing your primary care provider every 3 to 6 months helps us  monitor your health and provide consistent, personalized care.   Referrals If a referral was made during today's visit and you haven't received any updates within two weeks, please contact the referred provider directly to check on the status.  Recommended Screenings:  Health Maintenance  Topic Date Due   Eye exam for diabetics  Never done   Hepatitis C Screening  Never done   Colon Cancer Screening  02/15/2018   Medicare Annual Wellness Visit  11/17/2023   Flu Shot  04/27/2024   COVID-19 Vaccine (7 - 2025-26 season) 05/28/2024   Hemoglobin A1C  06/24/2024   Complete foot exam   08/20/2024   Yearly kidney function blood test for diabetes  08/21/2024   Yearly kidney health urinalysis for diabetes  08/21/2024   DTaP/Tdap/Td vaccine (3 - Td or Tdap) 09/06/2024   Pneumococcal Vaccine for age over 80  Completed   Zoster (Shingles) Vaccine  Completed   Meningitis B Vaccine  Aged Out       08/20/2024    2:32 PM  Advanced Directives  Does Patient Have a Medical Advance Directive? Yes  Type of Estate Agent of [place];Living will  Does patient want to make changes to medical advance directive? No - Patient declined  Copy of Healthcare Power of Attorney in Chart? No - copy requested    Vision: Annual vision screenings are recommended for early detection of glaucoma, cataracts, and diabetic retinopathy. These exams can also reveal signs  of chronic conditions such as diabetes and high blood pressure.  Dental: Annual dental screenings help detect early signs of oral cancer, gum disease, and other conditions linked to overall health, including heart disease and diabetes.  Please see the attached documents for additional preventive care recommendations.

## 2024-08-22 ENCOUNTER — Other Ambulatory Visit: Payer: Self-pay

## 2024-08-22 DIAGNOSIS — Z9109 Other allergy status, other than to drugs and biological substances: Secondary | ICD-10-CM

## 2024-08-27 MED ORDER — MONTELUKAST SODIUM 10 MG PO TABS: ORAL_TABLET | ORAL | 3 refills | Status: DC

## 2024-09-05 ENCOUNTER — Encounter: Payer: PPO | Admitting: Internal Medicine

## 2024-09-11 ENCOUNTER — Ambulatory Visit: Admitting: Family Medicine

## 2024-09-11 VITALS — BP 138/84 | HR 60 | Wt 243.6 lb

## 2024-09-11 DIAGNOSIS — Z9109 Other allergy status, other than to drugs and biological substances: Secondary | ICD-10-CM

## 2024-09-11 DIAGNOSIS — E782 Mixed hyperlipidemia: Secondary | ICD-10-CM | POA: Diagnosis not present

## 2024-09-11 DIAGNOSIS — G4720 Circadian rhythm sleep disorder, unspecified type: Secondary | ICD-10-CM

## 2024-09-11 DIAGNOSIS — Z1211 Encounter for screening for malignant neoplasm of colon: Secondary | ICD-10-CM

## 2024-09-11 DIAGNOSIS — E119 Type 2 diabetes mellitus without complications: Secondary | ICD-10-CM

## 2024-09-11 DIAGNOSIS — E66812 Obesity, class 2: Secondary | ICD-10-CM

## 2024-09-11 DIAGNOSIS — I10 Essential (primary) hypertension: Secondary | ICD-10-CM | POA: Diagnosis not present

## 2024-09-11 DIAGNOSIS — Z0000 Encounter for general adult medical examination without abnormal findings: Secondary | ICD-10-CM

## 2024-09-11 DIAGNOSIS — Z6835 Body mass index (BMI) 35.0-35.9, adult: Secondary | ICD-10-CM | POA: Diagnosis not present

## 2024-09-11 DIAGNOSIS — N183 Chronic kidney disease, stage 3 unspecified: Secondary | ICD-10-CM | POA: Diagnosis not present

## 2024-09-11 DIAGNOSIS — Z1159 Encounter for screening for other viral diseases: Secondary | ICD-10-CM | POA: Diagnosis not present

## 2024-09-11 MED ORDER — LORATADINE 10 MG PO TABS: 10.0000 mg | ORAL_TABLET | Freq: Every day | ORAL | 3 refills | Status: AC

## 2024-09-11 MED ORDER — MONTELUKAST SODIUM 10 MG PO TABS: ORAL_TABLET | ORAL | 3 refills | Status: AC

## 2024-09-11 MED ORDER — FLUTICASONE PROPIONATE 50 MCG/ACT NA SUSP: 2.0000 | Freq: Every day | NASAL | 1 refills | Status: AC

## 2024-09-11 NOTE — Progress Notes (Signed)
 SUBJECTIVE:   CHIEF COMPLAINT / HPI:  Alex Lewis. is a 73 y.o. male with a pertinent past medical history of T2DM, GERD, HLD, CKD 3 presenting to the clinic for preventative care physical.  Type 2 diabetes mellitus - Prior A1c 5.5 in November 2024 - Home CBGs: Not checking - Medications: Self discontinued Mounjaro  a few months ago - Eye exam: UTD - Foot exam: Due - Microalbumin: Due - Statin: Rosuvastatin  20 mg daily - No symptoms of hypoglycemia, polyuria, polydipsia, numbness of extremities, foot ulcers/trauma  Dysregulated sleep schedule Patient sleeps from 6 am - 1 pm, occasionally naps later in the afternoon. He feels well rested overall, not having to take frequent naps. No daytime drowsiness or fatigue. Patient states that he feels fine with the schedule and is not affecting his life negatively. Walks a lot every day, including briskly outside.  Average of about 6 miles daily.  STOP-BANG Score for OSA (Y=+1; >=3 is positive result) Snores loudly? 0 Tired, fatigued, or sleepy during the day time? 0 Observed stop breathing at night? 0 High blood pressure? 0 BMI >35? 0 AGE >50? +1 Neck circumference >40 cm? +1 Gender male? +1 SCORE: 3   PERTINENT PMH / PSH: CKD 3, T2DM HLD, GERD, HTN  *Remainder reviewed in problem list.   OBJECTIVE:   BP 138/84   Pulse 60   Wt 243 lb 9.6 oz (110.5 kg)   SpO2 97%   BMI 34.95 kg/m   General: Age-appropriate, resting comfortably in chair, NAD, alert and at baseline. Cardiovascular: Regular rate and rhythm. Normal S1/S2. No murmurs, rubs, or gallops appreciated. 2+ radial pulses. Pulmonary: Clear bilaterally to ascultation. No wheezes, crackles, or rhonchi. Normal WOB on room air. No accessory muscle use. Abdominal: No tenderness to deep or light palpation. No rebound or guarding.  Mildly distended. Skin: Warm and dry.  No rashes grossly. Extremities: No peripheral edema bilaterally. Capillary refill <2 seconds.   Diabetic foot exam as below.  Diabetic Foot Exam - Simple   Simple Foot Form Diabetic Foot exam was performed with the following findings: Yes 09/11/2024  4:09 PM  Visual Inspection See comments: Yes Sensation Testing Intact to touch and monofilament testing bilaterally: Yes Pulse Check Posterior Tibialis and Dorsalis pulse intact bilaterally: Yes Comments Diabetic foot exam with monofilament shows intact sensation and discrimination over all toes and plantar surfaces of feet bilaterally.  Single 1 cm abrasion on dorsal surface of L foot present, healing well.  No calluses present.      ASSESSMENT/PLAN:   Assessment & Plan Annual physical exam Ysidro Broxton is doing well overall.  No acute concerns today. Sleep-wake cycle disorder Overall, patient is sleeping early morning into the afternoon and is awake at night.  He states that this is not affecting his quality of life or causing significant disruption.  He feels well rested, has no drowsiness while awake, does not take frequent naps.  STOP-BANG score of 3, moderate concern for OSA. - Recommended melatonin 1 to 2 hours before sleep if desired - If desired, patient can begin to gradually adjust sleep-wake cycle by going to bed 1 hour earlier every other day - Patient deferred sleep study at this time, revisit conversation next visit Diabetes mellitus without complication (HCC) Stage 3 chronic kidney disease, unspecified whether stage 3a or 3b CKD (HCC) Class 2 severe obesity due to excess calories with serious comorbidity and body mass index (BMI) of 35.0 to 35.9 in adult Prior A1c adequate at 5.5.  Possible A1c has increased now that draws been discontinued.  Patient agrees to restart Mounjaro  if A1c is increasing.  Known CKD. - Hemoglobin A1c today - Urine microalbumin - BMP - Diabetic foot exam as above, single abrasion on top of left foot present but healing well Essential hypertension Well-controlled today. - Continue  bisoprolol -HCTZ 5-6.25 mg Hyperlipidemia, mixed - Continue rosuvastatin  20 mg - Lipid panel today Colon cancer screening - Amb ref GI Environmental allergies - Refill loratadine  Need for hepatitis C screening test - Routine hepatitis screening today  Follow-up in 6 months, discuss OSA, weight loss, and T2DM.  Hisayo Delossantos Toma, MD [facility]

## 2024-09-11 NOTE — Assessment & Plan Note (Signed)
 Prior A1c adequate at 5.5.  Possible A1c has increased now that draws been discontinued.  Patient agrees to restart Mounjaro  if A1c is increasing.  Known CKD. - Hemoglobin A1c today - Urine microalbumin - BMP - Diabetic foot exam as above, single abrasion on top of left foot present but healing well

## 2024-09-11 NOTE — Patient Instructions (Addendum)
 It was great to see you today! Thank you for choosing [facility] for your primary care.  Today we addressed: Labs We are checking some labs today, including A1c, kidney function, metabolic panel, cholesterol, and hepatitis C screening.  You will get a MyChart message or a letter if results are normal. Otherwise, you will get a call from us .  Diabetes If your A1c is up, I will contact you about restarting your Mounjaro .  Colonoscopy You had a referral placed for a colonoscopy, they will call you to set up an appointment. Please give us  a call if you don't hear back in the next 2 weeks.  Sleep If you decide that you are unhappy with your sleep schedule, please consider gradually shifting your sleep schedule back an hour or two each night and taking melatonin 1-2 hours before you want to go to sleep.  You should return to our clinic in 6 months.  Thank you for coming to see us  at [facility] and for the opportunity to care for you! Toma, Meghan Tiemann, MD 09/11/2024, 3:45 PM

## 2024-09-11 NOTE — Assessment & Plan Note (Signed)
-   Continue rosuvastatin  20 mg - Lipid panel today

## 2024-09-11 NOTE — Assessment & Plan Note (Signed)
 Well-controlled today. - Continue bisoprolol -HCTZ 5-6.25 mg

## 2024-09-12 LAB — BASIC METABOLIC PANEL WITH GFR
BUN/Creatinine Ratio: 9 — ABNORMAL LOW (ref 10–24)
BUN: 12 mg/dL (ref 8–27)
CO2: 23 mmol/L (ref 20–29)
Calcium: 9.4 mg/dL (ref 8.6–10.2)
Chloride: 101 mmol/L (ref 96–106)
Creatinine, Ser: 1.29 mg/dL — ABNORMAL HIGH (ref 0.76–1.27)
Glucose: 129 mg/dL — ABNORMAL HIGH (ref 70–99)
Potassium: 4.2 mmol/L (ref 3.5–5.2)
Sodium: 139 mmol/L (ref 134–144)
eGFR: 59 mL/min/1.73 — ABNORMAL LOW (ref >59)

## 2024-09-12 LAB — MICROALBUMIN / CREATININE URINE RATIO
Creatinine, Urine: 129.1 mg/dL
Microalb/Creat Ratio: 7 mg/g{creat} (ref 0–29)
Microalbumin, Urine: 8.6 ug/mL

## 2024-09-12 LAB — LIPID PANEL
Chol/HDL Ratio: 2.5 ratio (ref 0.0–5.0)
Cholesterol, Total: 113 mg/dL (ref 100–199)
HDL: 46 mg/dL (ref >39)
LDL Chol Calc (NIH): 49 mg/dL (ref 0–99)
Triglycerides: 95 mg/dL (ref 0–149)
VLDL Cholesterol Cal: 18 mg/dL (ref 5–40)

## 2024-09-12 LAB — HEPATITIS C ANTIBODY: Hep C Virus Ab: NONREACTIVE

## 2024-09-12 LAB — HEMOGLOBIN A1C
Est. average glucose Bld gHb Est-mCnc: 131 mg/dL
Hgb A1c MFr Bld: 6.2 % — ABNORMAL HIGH (ref 4.8–5.6)

## 2024-09-13 ENCOUNTER — Ambulatory Visit: Payer: Self-pay | Admitting: Family Medicine

## 2024-09-13 DIAGNOSIS — N1831 Chronic kidney disease, stage 3a: Secondary | ICD-10-CM

## 2024-10-31 ENCOUNTER — Encounter: Payer: Self-pay | Admitting: Gastroenterology
